# Patient Record
Sex: Female | Born: 1940 | Race: Black or African American | Hispanic: No | Marital: Married | Smoking: Never smoker
Health system: Southern US, Community
[De-identification: ages and names within clinical notes are randomized; demographics above are authoritative.]

## PROBLEM LIST (undated history)

## (undated) DIAGNOSIS — M1990 Unspecified osteoarthritis, unspecified site: Secondary | ICD-10-CM

## (undated) HISTORY — PX: ABDOMINAL HYSTERECTOMY: SHX81

## (undated) HISTORY — PX: BREAST EXCISIONAL BIOPSY: SUR124

## (undated) HISTORY — PX: TONSILLECTOMY: SUR1361

---

## 2001-05-05 ENCOUNTER — Emergency Department (HOSPITAL_COMMUNITY): Admission: EM | Admit: 2001-05-05 | Discharge: 2001-05-05 | Payer: Self-pay

## 2001-12-13 ENCOUNTER — Emergency Department (HOSPITAL_COMMUNITY): Admission: EM | Admit: 2001-12-13 | Discharge: 2001-12-13 | Payer: Self-pay | Admitting: Emergency Medicine

## 2004-07-14 ENCOUNTER — Emergency Department (HOSPITAL_COMMUNITY): Admission: EM | Admit: 2004-07-14 | Discharge: 2004-07-14 | Payer: Self-pay | Admitting: Emergency Medicine

## 2005-03-21 ENCOUNTER — Ambulatory Visit (HOSPITAL_COMMUNITY): Admission: RE | Admit: 2005-03-21 | Discharge: 2005-03-21 | Payer: Self-pay | Admitting: Gastroenterology

## 2005-04-30 ENCOUNTER — Observation Stay (HOSPITAL_COMMUNITY): Admission: RE | Admit: 2005-04-30 | Discharge: 2005-05-01 | Payer: Self-pay | Admitting: Orthopedic Surgery

## 2007-08-25 ENCOUNTER — Encounter: Admission: RE | Admit: 2007-08-25 | Discharge: 2007-08-25 | Payer: Self-pay | Admitting: Internal Medicine

## 2007-09-07 ENCOUNTER — Encounter: Admission: RE | Admit: 2007-09-07 | Discharge: 2007-09-07 | Payer: Self-pay | Admitting: Internal Medicine

## 2007-12-30 ENCOUNTER — Emergency Department (HOSPITAL_COMMUNITY): Admission: EM | Admit: 2007-12-30 | Discharge: 2007-12-31 | Payer: Self-pay | Admitting: Emergency Medicine

## 2008-09-22 ENCOUNTER — Encounter: Admission: RE | Admit: 2008-09-22 | Discharge: 2008-09-22 | Payer: Self-pay | Admitting: Internal Medicine

## 2009-03-20 ENCOUNTER — Emergency Department (HOSPITAL_COMMUNITY): Admission: EM | Admit: 2009-03-20 | Discharge: 2009-03-21 | Payer: Self-pay | Admitting: Emergency Medicine

## 2009-05-24 ENCOUNTER — Inpatient Hospital Stay (HOSPITAL_COMMUNITY): Admission: RE | Admit: 2009-05-24 | Discharge: 2009-05-28 | Payer: Self-pay | Admitting: Orthopedic Surgery

## 2009-11-05 ENCOUNTER — Encounter: Admission: RE | Admit: 2009-11-05 | Discharge: 2009-11-05 | Payer: Self-pay | Admitting: Internal Medicine

## 2011-01-13 ENCOUNTER — Encounter
Admission: RE | Admit: 2011-01-13 | Discharge: 2011-01-13 | Payer: Self-pay | Source: Home / Self Care | Attending: Internal Medicine | Admitting: Internal Medicine

## 2011-01-19 ENCOUNTER — Encounter: Payer: Self-pay | Admitting: Internal Medicine

## 2011-04-08 LAB — PROTIME-INR
INR: 1 (ref 0.00–1.49)
INR: 1.7 — ABNORMAL HIGH (ref 0.00–1.49)
Prothrombin Time: 13.5 seconds (ref 11.6–15.2)
Prothrombin Time: 20.3 seconds — ABNORMAL HIGH (ref 11.6–15.2)
Prothrombin Time: 20.6 seconds — ABNORMAL HIGH (ref 11.6–15.2)
Prothrombin Time: 24.4 seconds — ABNORMAL HIGH (ref 11.6–15.2)

## 2011-04-08 LAB — COMPREHENSIVE METABOLIC PANEL
AST: 19 U/L (ref 0–37)
BUN: 11 mg/dL (ref 6–23)
CO2: 28 mEq/L (ref 19–32)
Chloride: 110 mEq/L (ref 96–112)
Sodium: 146 mEq/L — ABNORMAL HIGH (ref 135–145)
Total Protein: 7.3 g/dL (ref 6.0–8.3)

## 2011-04-08 LAB — DIFFERENTIAL
Basophils Relative: 2 % — ABNORMAL HIGH (ref 0–1)
Eosinophils Absolute: 0 10*3/uL (ref 0.0–0.7)
Eosinophils Relative: 1 % (ref 0–5)
Lymphocytes Relative: 28 % (ref 12–46)
Lymphs Abs: 2.6 10*3/uL (ref 0.7–4.0)

## 2011-04-08 LAB — URINALYSIS, ROUTINE W REFLEX MICROSCOPIC
Bilirubin Urine: NEGATIVE
Glucose, UA: NEGATIVE mg/dL
Protein, ur: NEGATIVE mg/dL
Urobilinogen, UA: 0.2 mg/dL (ref 0.0–1.0)

## 2011-04-08 LAB — TYPE AND SCREEN
ABO/RH(D): A POS
Antibody Screen: NEGATIVE

## 2011-04-08 LAB — HEMOGLOBIN AND HEMATOCRIT, BLOOD
HCT: 31 % — ABNORMAL LOW (ref 36.0–46.0)
Hemoglobin: 10.2 g/dL — ABNORMAL LOW (ref 12.0–15.0)

## 2011-04-08 LAB — HEMATOCRIT: HCT: 31.5 % — ABNORMAL LOW (ref 36.0–46.0)

## 2011-04-08 LAB — CBC
HCT: 28.4 % — ABNORMAL LOW (ref 36.0–46.0)
HCT: 39.3 % (ref 36.0–46.0)
Hemoglobin: 12.9 g/dL (ref 12.0–15.0)
MCHC: 33.5 g/dL (ref 30.0–36.0)
Platelets: 211 10*3/uL (ref 150–400)
RBC: 3.05 MIL/uL — ABNORMAL LOW (ref 3.87–5.11)
RDW: 12.9 % (ref 11.5–15.5)

## 2011-04-08 LAB — ABO/RH: ABO/RH(D): A POS

## 2011-04-08 LAB — APTT: aPTT: 24 seconds (ref 24–37)

## 2011-04-10 LAB — POCT I-STAT, CHEM 8
BUN: 10 mg/dL (ref 6–23)
Calcium, Ion: 1.16 mmol/L (ref 1.12–1.32)
Creatinine, Ser: 0.9 mg/dL (ref 0.4–1.2)
Glucose, Bld: 96 mg/dL (ref 70–99)
TCO2: 25 mmol/L (ref 0–100)

## 2011-04-10 LAB — URINALYSIS, ROUTINE W REFLEX MICROSCOPIC
Glucose, UA: NEGATIVE mg/dL
Hgb urine dipstick: NEGATIVE
Ketones, ur: NEGATIVE mg/dL
Protein, ur: NEGATIVE mg/dL
Urobilinogen, UA: 1 mg/dL (ref 0.0–1.0)

## 2011-04-10 LAB — DIFFERENTIAL
Eosinophils Relative: 1 % (ref 0–5)
Lymphocytes Relative: 8 % — ABNORMAL LOW (ref 12–46)
Lymphs Abs: 0.8 10*3/uL (ref 0.7–4.0)
Monocytes Absolute: 0.7 10*3/uL (ref 0.1–1.0)
Monocytes Relative: 8 % (ref 3–12)

## 2011-04-10 LAB — CBC
HCT: 41.3 % (ref 36.0–46.0)
Hemoglobin: 13.4 g/dL (ref 12.0–15.0)
WBC: 10 10*3/uL (ref 4.0–10.5)

## 2011-04-10 LAB — URINE MICROSCOPIC-ADD ON

## 2011-04-17 ENCOUNTER — Emergency Department (HOSPITAL_COMMUNITY)
Admission: EM | Admit: 2011-04-17 | Discharge: 2011-04-18 | Disposition: A | Discharge status: Home / Self Care | Payer: BC Managed Care – PPO | Attending: Emergency Medicine | Admitting: Emergency Medicine

## 2011-04-17 DIAGNOSIS — M1990 Unspecified osteoarthritis, unspecified site: Secondary | ICD-10-CM | POA: Insufficient documentation

## 2011-04-17 DIAGNOSIS — Z96649 Presence of unspecified artificial hip joint: Secondary | ICD-10-CM | POA: Insufficient documentation

## 2011-04-17 DIAGNOSIS — M25559 Pain in unspecified hip: Secondary | ICD-10-CM | POA: Insufficient documentation

## 2011-04-17 DIAGNOSIS — M25569 Pain in unspecified knee: Secondary | ICD-10-CM | POA: Insufficient documentation

## 2011-05-13 NOTE — Op Note (Signed)
NAMEMADISSON, Nancy Vasquez             ACCOUNT NO.:  [id number]   MEDICAL RECORD NO.:  [id number]          PATIENT TYPE:  INP   LOCATION:  0004                         FACILITY:  [facility]   PHYSICIAN:  Georges Lynch. Gioffre, M.D.DATE OF BIRTH:  [birth year]   DATE OF PROCEDURE:  05/24/2009  DATE OF DISCHARGE:                               OPERATIVE REPORT   SURGEON:  Georges Lynch. Darrelyn Hillock, M.D.   ASSISTANT:  Madlyn Frankel. Charlann Boxer, M.D.   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   POSTOPERATIVE DIAGNOSIS:  Severe degenerative arthritis of the right  hip.   HOSPITAL COURSE:  The operation was a right total hip arthroplasty  utilizing a DePuy system.  We utilized a size 6 high offset Tri-Lock  stem.  The cup size was a 52 mm Pinnacle cup.  The femoral head was a  +1.5 diameter 36 mm.  One screw was utilized for fixation of the  pinnacle cup.  The hole eliminator also was used.  The metal insert  which was the pinnacle cup was a 52 mm outside diameter.  The head was a  +1.5 metal head.  The diameter of the head was 36 mm.  The procedure was  under general anesthesia.  The patient first had 2 g of IV Ancef.  Routine orthopedic prep and drape of the right hip was carried out.  Bleeders were identified and cauterized.  At this time, a posterolateral  approach to the hip was carried out.  Bleeders were identified and  cauterized.  I went down and incised the iliotibial band.  Then went  down and partially detached external rotators and did a capsulectomy.  The hip was dislocated.  The neck cut and guide was used.  We cut the  neck at the appropriate length and angle.  Following that, I then used  my box osteotome to clear out the cancellus bone of the greater  trochanteric region.  We then used a widening reamer and then we rasped  up to a size 6 Tri-Lock stem.  After that was done, we  thoroughly  irrigated out the canal.  We irrigated the canal prior to the rasping  and after  the rasping.  We then paid attention to the cup.  We did a  partial capsulectomy, removed the overlying spurs of the cup from the  acetabulum.  We then reamed the acetabulum up to a size 51 for a 52 mm  cup.  We then inserted our permanent 52 mm pinnacle cup with one screw.  We utilized the Charnley guide to check the cup before the screw was  inserted.  We had excellent alignment following that.  We then inserted  our temporary liner into the cup.  We inserted our temporary trial stem,  reduced the hip, had excellent function and excellent stability.  We  also checked and made sure that we had good leg length which we did.  We  checked the leg length several times.  We then removed the trials and  inserted our permanent metal insert.  Following that, we then inserted  our  size 6 Tri-Lock stem.  We then cleared the acetabulum and reduced  the hip.  Note prior to inserting our permanent cup, we inserted a hole  eliminator.  We had one screw for cup fixation.  The hip was reduced  quite nicely and we had good stability.  We did use a +1.5 metal ball 36  mm  diameter for the head.  Obviously we went through the trials before this  and that was the length we selected.  Thoroughly irrigated out the area,  closed the wound in layers in the usual fashion.  We had good  hemostasis.  The skin was closed with metal staples and a sterile  dressing was applied.           ______________________________  Georges Lynch Darrelyn Hillock, M.D.     RAG/MEDQ  D:  05/24/2009  T:  05/24/2009  Job:  [id number]   cc:   Windy Fast A. Darrelyn Hillock, M.D.  Fax: [phone]

## 2011-05-13 NOTE — H&P (Signed)
Nancy Vasquez, Nancy Vasquez             ACCOUNT NO.:  [id number]   MEDICAL RECORD NO.:  [id number]          PATIENT TYPE:  INP   LOCATION:  0004                         FACILITY:  [facility]   PHYSICIAN:  Georges Lynch. Gioffre, M.D.DATE OF BIRTH:  09-06-1941   DATE OF ADMISSION:  05/24/2009  DATE OF DISCHARGE:                              HISTORY & PHYSICAL   The patient was seen in the office for a history and physical  complaining of pain in the right hip.   PRESENT ILLNESS:  Painful motion of her right hip and painful pain when  she ambulated.   ALLERGIES:  NONE.   MEDICINES:  She was on Darvocet at home for pain.  She was also on  glucosamine.   FAMILY HISTORY:  Her mother had a stroke.  She cannot recall anything  about her father.   REVIEW OF SYSTEMS:  She had a heart murmur in the past and a history of  rheumatic fever as a child.   PHYSICAL EXAM:  MENTAL STATUS:  She was alert and oriented.  GAIT:  She had an antalgic gait.  Her temperature was 98.6.  Pulse 60.  Respirations normal.  Blood  pressure 115/80.  HEAD AND NECK:  Negative.  LUNGS:  Clear  HEART:  Normal sinus rhythm with no murmurs.  BREAST:  Exam was deferred.  ABDOMEN:  Completely normal.  BACK:  Normal.  LEFT HIP:  She had some pain with motion of the left hip.  The main  problem was painful limited motion to the right hip.  She also had about  1/2 inch shortening on the right as compared to the left.  She had good  circulation in her lower extremities.  KNEES:  Negative.  CALVES:  Soft, nontender.  No phlebitis.   X-rays of her hip showed severe degenerative arthritis of the right hip.   IMPRESSION:  Degenerative arthritis, right hip.   PLAN:  I planned to admit her to the hospital for a right total hip  arthroplasty utilizing the DePuy system.           ______________________________  Georges Lynch Nancy Vasquez, M.D.    RAG/MEDQ  D:  05/24/2009  T:  05/24/2009  Job:  [id number]

## 2011-05-16 NOTE — Discharge Summary (Signed)
NAMEJAYLEN, CLAUDE             ACCOUNT NO.:  [id number]   MEDICAL RECORD NO.:  [id number]          PATIENT TYPE:  INP   LOCATION:  1525                         FACILITY:  [facility]   PHYSICIAN:  Georges Lynch. Gioffre, M.D.DATE OF BIRTH:  06-Jan-1941   DATE OF ADMISSION:  05/24/2009  DATE OF DISCHARGE:  05/28/2009                               DISCHARGE SUMMARY   HISTORY OF PRESENT ILLNESS:  Mrs. Deeley was admitted to the hospital  and taken to surgery on May 24, 2009.  She had a diagnosis of  degenerative arthritis of the right hip.  On May 24, 2009, we did a  right total hip arthroplasty on her.  Dr. Charlann Boxer was the assist.  He  estimated blood loss about 400 mL.  Postop, she was maintained on the  Coumadin/heparin protocol.  She was seen by me the following day on May 25, 2009, at 7:20 a.m. she was doing well.  Hemoglobin was 10,  temperature 99.  She was able to dorsiflex her foot.  Her x-rays postop  looked excellent.  The patient was also followed by pharmacy with the  INR in order to manage the Coumadin levels.  He was also seen the  following day on 05/26/2009, temperature 99, pulse 86, blood pressure  100/64, hemoglobin 9.4.  She was being evaluated for home discharge.  She was seen again on May 27, 2009.  She was doing fine.  Her hemoglobin  was 9.5, INR 1.7.  Very minimal amount of serous drainage from the  wound, but the wound looked fine.  On 05/31, she was seen again and  evaluated and she had no postoperative complications, temperature 98.4,  INR 2.5.  She was then discharged to be followed in our office.  She was  discharged on May 28, 2009, and followed in the office.  The pertinent  laboratory results with her final hemoglobin 10.2, hematocrit 31.  Her  white count on admission 9200, hemoglobin 12.9, hematocrit 39.3,  platelet count 301.  Differential was normal.  Her sodium 146, potassium  5.1, chloride was 110.  Creatinine was 0.73, SGOT 19, SGPT 20, alkaline  phosphatase normal at 53.  Initial INR was one.  Initial PT was 13.5 the  initial PTT was normal at 44.  Postoperative x-ray looks excellent with  no particular complications as far as her x-ray.   DISCHARGE MEDICATIONS:  1. Robaxin 500 mg t.i.d. p.r.n. for spasms.  2. Percocet 10/650 one every 4 hours p.r.n. for pain.  3. Coumadin 5 mg a day.   DISCHARGE INSTRUCTIONS:  1. See me in the office for follow-up in 2 weeks from the day of      surgery prior to if there is a problem.  2. She has to have an INR done weekly to manage her Coumadin.  3. She can ambulate toe touch to partial weightbearing on that      affected side.  4. Dressing changes daily at home.  5. If she has any postoperative complications, she was instructed to      call us at the office.  ______________________________  Georges Lynch Darrelyn Hillock, M.D.     RAG/MEDQ  D:  06/19/2009  T:  06/19/2009  Job:  [id number]

## 2011-05-16 NOTE — Op Note (Signed)
Nancy Vasquez, Nancy Vasquez             ACCOUNT NO.:  [id number]   MEDICAL RECORD NO.:  [id number]          PATIENT TYPE:  OBV   LOCATION:  0447                         [facility]ACILITY:  [facility]   PHYSICIAN:  Georges Lynch. Gioffre, M.D.DATE OF BIRTH:  1941/02/23   DATE OF PROCEDURE:  04/30/2005  DATE OF DISCHARGE:                                 OPERATIVE REPORT   SURGEON:  Georges Lynch. Darrelyn Hillock, M.D.   ASSISTANT:  Ebbie Ridge. Paitsel, P.A.   PREOPERATIVE DIAGNOSES:  1.  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  2.  Severe impingement syndrome causing the above tear.   POSTOPERATIVE DIAGNOSES:  1.  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  2.  Severe impingement syndrome causing the above tear.   OPERATIONS:  1.  Partial acromionectomy and acromioplasty, left shoulder.  2.  Repair of a large central horseshoe-type hole in the rotator cuff tendon      of left shoulder utilizing the Restore tendon graft and two Mitek anchor      sutures.   DESCRIPTION OF PROCEDURE:  Prior to general anesthesia, the patient had an  interscalene nerve block in the holding area and then was taken back to the  operating room and a general anesthetic was given.  At this time, a sterile  prep and drape of the left shoulder was carried out.  The patient was given  1 g of IV Ancef.  An incision was made over the anterior aspect of the left  shoulder.  Bleeders were identified and cauterized.  At this time, I  separated the deltoid tendon by sharp dissection from the area.  I then  noted a severe overgrowth of the acromion.  We brought the shoulder up in  abduction and there was a large central horseshoe-type hole in the tendon  that was caused by the impingement of the acromion.  We then did an  acromionectomy and acromioplasty in the usual fashion.  We then bone waxed  the undersurface of the acromion and irrigated the area out.  Then we burred  down with the bur the lateral articular surface of  the humerus.  Two two-  prong Mitek sutures were placed in the proximal humerus.  I then utilized a  Restore graft and brought the central part of the tendon down in some  tension and sutured it in place.  The graft was sutured to the proximal  tendon.  Then the distal part of the graft was sutured by way of the Mitek  sutures to the humerus.  We had a nice repair.  We irrigated the area out  and then reattached the deltoid tendon and muscle in usual fashion.  The  subcutaneous was closed with 0 Vicryl.  The skin was closed with staples.  I  did inject about 15 mL of 0.5% Marcaine into the wound site.  Sterile  dressings were applied and the patient was placed in a shoulder immobilizer.     RAG/MEDQ  D:  04/30/2005  T:  04/30/2005  Job:  [id number]

## 2011-06-26 ENCOUNTER — Other Ambulatory Visit: Payer: Self-pay | Admitting: Orthopedic Surgery

## 2011-06-26 ENCOUNTER — Ambulatory Visit
Admission: RE | Admit: 2011-06-26 | Discharge: 2011-06-26 | Disposition: A | Discharge status: Home / Self Care | Payer: BC Managed Care – PPO | Source: Ambulatory Visit | Attending: Orthopedic Surgery | Admitting: Orthopedic Surgery

## 2011-06-26 DIAGNOSIS — M79605 Pain in left leg: Secondary | ICD-10-CM

## 2012-02-12 ENCOUNTER — Other Ambulatory Visit: Payer: Self-pay | Admitting: Family Medicine

## 2012-02-12 DIAGNOSIS — Z1231 Encounter for screening mammogram for malignant neoplasm of breast: Secondary | ICD-10-CM

## 2012-03-01 ENCOUNTER — Ambulatory Visit: Payer: BC Managed Care – PPO

## 2012-03-15 ENCOUNTER — Ambulatory Visit
Admission: RE | Admit: 2012-03-15 | Discharge: 2012-03-15 | Disposition: A | Discharge status: Home / Self Care | Payer: BC Managed Care – PPO | Source: Ambulatory Visit | Attending: Family Medicine | Admitting: Family Medicine

## 2012-03-15 DIAGNOSIS — Z1231 Encounter for screening mammogram for malignant neoplasm of breast: Secondary | ICD-10-CM

## 2013-02-08 ENCOUNTER — Ambulatory Visit
Admission: RE | Admit: 2013-02-08 | Discharge: 2013-02-08 | Disposition: A | Discharge status: Home / Self Care | Payer: Worker's Compensation | Source: Ambulatory Visit | Attending: Internal Medicine | Admitting: Internal Medicine

## 2013-02-08 ENCOUNTER — Other Ambulatory Visit: Payer: Self-pay | Admitting: Internal Medicine

## 2013-02-08 DIAGNOSIS — H538 Other visual disturbances: Secondary | ICD-10-CM

## 2013-02-08 DIAGNOSIS — S0990XA Unspecified injury of head, initial encounter: Secondary | ICD-10-CM

## 2013-02-08 DIAGNOSIS — R42 Dizziness and giddiness: Secondary | ICD-10-CM

## 2013-02-16 ENCOUNTER — Other Ambulatory Visit: Payer: Self-pay | Admitting: Family Medicine

## 2013-02-16 DIAGNOSIS — Z1231 Encounter for screening mammogram for malignant neoplasm of breast: Secondary | ICD-10-CM

## 2013-03-21 ENCOUNTER — Ambulatory Visit: Payer: Worker's Compensation

## 2013-03-24 ENCOUNTER — Ambulatory Visit
Admission: RE | Admit: 2013-03-24 | Discharge: 2013-03-24 | Disposition: A | Discharge status: Home / Self Care | Payer: BC Managed Care – PPO | Source: Ambulatory Visit | Attending: Family Medicine | Admitting: Family Medicine

## 2013-03-24 DIAGNOSIS — Z1231 Encounter for screening mammogram for malignant neoplasm of breast: Secondary | ICD-10-CM

## 2013-03-31 ENCOUNTER — Ambulatory Visit
Admission: RE | Admit: 2013-03-31 | Discharge: 2013-03-31 | Disposition: A | Discharge status: Home / Self Care | Payer: Worker's Compensation | Source: Ambulatory Visit | Attending: Neurology | Admitting: Neurology

## 2013-03-31 ENCOUNTER — Other Ambulatory Visit: Payer: Self-pay | Admitting: Neurology

## 2013-03-31 DIAGNOSIS — M542 Cervicalgia: Secondary | ICD-10-CM

## 2013-03-31 DIAGNOSIS — W19XXXA Unspecified fall, initial encounter: Secondary | ICD-10-CM

## 2013-04-13 ENCOUNTER — Ambulatory Visit: Payer: Worker's Compensation | Attending: Neurology | Admitting: Physical Therapy

## 2013-04-13 DIAGNOSIS — R42 Dizziness and giddiness: Secondary | ICD-10-CM | POA: Insufficient documentation

## 2013-04-13 DIAGNOSIS — IMO0001 Reserved for inherently not codable concepts without codable children: Secondary | ICD-10-CM | POA: Insufficient documentation

## 2013-04-21 ENCOUNTER — Ambulatory Visit: Payer: Worker's Compensation | Admitting: *Deleted

## 2013-04-29 ENCOUNTER — Ambulatory Visit: Payer: Worker's Compensation | Attending: Neurology | Admitting: Rehabilitative and Restorative Service Providers"

## 2013-04-29 DIAGNOSIS — IMO0001 Reserved for inherently not codable concepts without codable children: Secondary | ICD-10-CM | POA: Insufficient documentation

## 2013-04-29 DIAGNOSIS — R42 Dizziness and giddiness: Secondary | ICD-10-CM | POA: Insufficient documentation

## 2013-05-04 ENCOUNTER — Ambulatory Visit: Payer: Worker's Compensation | Admitting: Rehabilitative and Restorative Service Providers"

## 2013-05-11 ENCOUNTER — Ambulatory Visit: Payer: Worker's Compensation | Admitting: Rehabilitative and Restorative Service Providers"

## 2013-08-12 ENCOUNTER — Other Ambulatory Visit (HOSPITAL_COMMUNITY): Payer: Self-pay | Admitting: Cardiology

## 2013-08-12 DIAGNOSIS — R079 Chest pain, unspecified: Secondary | ICD-10-CM

## 2013-08-24 ENCOUNTER — Encounter (HOSPITAL_COMMUNITY)
Admission: RE | Admit: 2013-08-24 | Discharge: 2013-08-24 | Disposition: A | Discharge status: Home / Self Care | Payer: BC Managed Care – PPO | Source: Ambulatory Visit | Attending: Cardiology | Admitting: Cardiology

## 2013-08-24 ENCOUNTER — Other Ambulatory Visit: Payer: Self-pay

## 2013-08-24 DIAGNOSIS — R079 Chest pain, unspecified: Secondary | ICD-10-CM | POA: Insufficient documentation

## 2013-08-24 MED ORDER — TECHNETIUM TC 99M SESTAMIBI GENERIC - CARDIOLITE
10.0000 | Freq: Once | INTRAVENOUS | Status: AC | PRN
  Administered 2013-08-24: 10 via INTRAVENOUS

## 2013-08-24 MED ORDER — REGADENOSON 0.4 MG/5ML IV SOLN
INTRAVENOUS | Status: AC
  Administered 2013-08-24: 0.4 mg via INTRAVENOUS
  Filled 2013-08-24: qty 5

## 2013-08-24 MED ORDER — REGADENOSON 0.4 MG/5ML IV SOLN
0.4000 mg | Freq: Once | INTRAVENOUS | Status: AC
  Administered 2013-08-24: 0.4 mg via INTRAVENOUS

## 2013-08-24 MED ORDER — TECHNETIUM TC 99M SESTAMIBI GENERIC - CARDIOLITE
30.0000 | Freq: Once | INTRAVENOUS | Status: AC | PRN
  Administered 2013-08-24: 30 via INTRAVENOUS

## 2014-01-27 ENCOUNTER — Other Ambulatory Visit: Payer: Self-pay | Admitting: Physician Assistant

## 2014-01-27 DIAGNOSIS — Z780 Asymptomatic menopausal state: Secondary | ICD-10-CM

## 2014-02-20 ENCOUNTER — Ambulatory Visit
Admission: RE | Admit: 2014-02-20 | Discharge: 2014-02-20 | Disposition: A | Discharge status: Home / Self Care | Payer: Self-pay | Source: Ambulatory Visit | Attending: Physician Assistant | Admitting: Physician Assistant

## 2014-02-20 DIAGNOSIS — Z780 Asymptomatic menopausal state: Secondary | ICD-10-CM

## 2014-11-19 ENCOUNTER — Emergency Department (HOSPITAL_COMMUNITY): Payer: BC Managed Care – PPO

## 2014-11-19 ENCOUNTER — Emergency Department (HOSPITAL_COMMUNITY)
Admission: EM | Admit: 2014-11-19 | Discharge: 2014-11-19 | Disposition: A | Discharge status: Home / Self Care | Payer: BC Managed Care – PPO | Attending: Emergency Medicine | Admitting: Emergency Medicine

## 2014-11-19 ENCOUNTER — Encounter (HOSPITAL_COMMUNITY): Payer: Self-pay | Admitting: Family Medicine

## 2014-11-19 DIAGNOSIS — Z791 Long term (current) use of non-steroidal anti-inflammatories (NSAID): Secondary | ICD-10-CM | POA: Insufficient documentation

## 2014-11-19 DIAGNOSIS — R1031 Right lower quadrant pain: Secondary | ICD-10-CM | POA: Diagnosis present

## 2014-11-19 DIAGNOSIS — M1990 Unspecified osteoarthritis, unspecified site: Secondary | ICD-10-CM | POA: Diagnosis not present

## 2014-11-19 DIAGNOSIS — Z7982 Long term (current) use of aspirin: Secondary | ICD-10-CM | POA: Insufficient documentation

## 2014-11-19 DIAGNOSIS — Z79899 Other long term (current) drug therapy: Secondary | ICD-10-CM | POA: Insufficient documentation

## 2014-11-19 HISTORY — DX: Unspecified osteoarthritis, unspecified site: M19.90

## 2014-11-19 LAB — CBC WITH DIFFERENTIAL/PLATELET
BASOS ABS: 0 10*3/uL (ref 0.0–0.1)
BASOS PCT: 0 % (ref 0–1)
Eosinophils Absolute: 0.2 10*3/uL (ref 0.0–0.7)
Eosinophils Relative: 3 % (ref 0–5)
HEMATOCRIT: 39.5 % (ref 36.0–46.0)
HEMOGLOBIN: 12.5 g/dL (ref 12.0–15.0)
LYMPHS PCT: 41 % (ref 12–46)
Lymphs Abs: 2.6 10*3/uL (ref 0.7–4.0)
MCH: 29.4 pg (ref 26.0–34.0)
MCHC: 31.6 g/dL (ref 30.0–36.0)
MCV: 92.9 fL (ref 78.0–100.0)
MONO ABS: 0.7 10*3/uL (ref 0.1–1.0)
MONOS PCT: 11 % (ref 3–12)
NEUTROS ABS: 2.8 10*3/uL (ref 1.7–7.7)
NEUTROS PCT: 45 % (ref 43–77)
Platelets: 279 10*3/uL (ref 150–400)
RBC: 4.25 MIL/uL (ref 3.87–5.11)
RDW: 12.5 % (ref 11.5–15.5)
WBC: 6.2 10*3/uL (ref 4.0–10.5)

## 2014-11-19 LAB — COMPREHENSIVE METABOLIC PANEL
ALBUMIN: 3.8 g/dL (ref 3.5–5.2)
ALK PHOS: 67 U/L (ref 39–117)
ALT: 16 U/L (ref 0–35)
AST: 15 U/L (ref 0–37)
Anion gap: 13 (ref 5–15)
BILIRUBIN TOTAL: 0.2 mg/dL — AB (ref 0.3–1.2)
BUN: 14 mg/dL (ref 6–23)
CHLORIDE: 107 meq/L (ref 96–112)
CO2: 23 meq/L (ref 19–32)
CREATININE: 0.76 mg/dL (ref 0.50–1.10)
Calcium: 9.3 mg/dL (ref 8.4–10.5)
GFR calc Af Amer: >90 mL/min (ref >90)
GFR, EST NON AFRICAN AMERICAN: 83 mL/min — AB (ref >90)
Glucose, Bld: 84 mg/dL (ref 70–99)
POTASSIUM: 4 meq/L (ref 3.7–5.3)
Sodium: 143 mEq/L (ref 137–147)
Total Protein: 7.4 g/dL (ref 6.0–8.3)

## 2014-11-19 LAB — URINALYSIS, ROUTINE W REFLEX MICROSCOPIC
BILIRUBIN URINE: NEGATIVE
GLUCOSE, UA: NEGATIVE mg/dL
HGB URINE DIPSTICK: NEGATIVE
KETONES UR: NEGATIVE mg/dL
Leukocytes, UA: NEGATIVE
Nitrite: NEGATIVE
PH: 6 (ref 5.0–8.0)
PROTEIN: NEGATIVE mg/dL
Specific Gravity, Urine: 1.022 (ref 1.005–1.030)
Urobilinogen, UA: 0.2 mg/dL (ref 0.0–1.0)

## 2014-11-19 LAB — I-STAT CREATININE, ED: CREATININE: 0.8 mg/dL (ref 0.50–1.10)

## 2014-11-19 MED ORDER — IOHEXOL 300 MG/ML  SOLN
80.0000 mL | Freq: Once | INTRAMUSCULAR | Status: AC | PRN
  Administered 2014-11-19: 80 mL via INTRAVENOUS

## 2014-11-19 MED ORDER — IOHEXOL 300 MG/ML  SOLN
25.0000 mL | Freq: Once | INTRAMUSCULAR | Status: AC | PRN
  Administered 2014-11-19: 25 mL via ORAL

## 2014-11-19 NOTE — ED Provider Notes (Signed)
CSN: [id number]     Arrival date & time 11/19/14  1629 History   First MD Initiated Contact with Patient 11/19/14 1653     Chief Complaint  Patient presents with  . Abdominal Pain     (Consider location/radiation/quality/duration/timing/severity/associated sxs/prior Treatment) HPI 73 year old female presents with 3 weeks gradual onset constant dull ache right lower quadrant pain radiating slightly to the left lower quadrant worse with position changes and palpation no change with eating or drinking no dysuria no diarrhea no bloody stools no nausea vomiting no severe pain no pain down her legs no weakness or numbness no change in bowel or bladder control no chest pain cough or shortness of breath no treatment prior to arrival. She does not know if she has had her appendix removed when she had her hysterectomy. She has had prior cholecystectomy. Past Medical History  Diagnosis Date  . Arthritis     has had right hip replacement   Past Surgical History  Procedure Laterality Date  . Abdominal hysterectomy     History reviewed. No pertinent family history. History  Substance Use Topics  . Smoking status: Never Smoker   . Smokeless tobacco: Not on file  . Alcohol Use: Yes   OB History    No data available     Review of Systems 10 Systems reviewed and are negative for acute change except as noted in the HPI.   Allergies  Review of patient's allergies indicates no known allergies.  Home Medications   Prior to Admission medications   Medication Sig Start Date End Date Taking? Authorizing Provider  aspirin 81 MG tablet Take 81 mg by mouth daily.   Yes Historical Provider, MD  naproxen sodium (ANAPROX) 220 MG tablet Take 220 mg by mouth 2 (two) times daily with a meal.   Yes Historical Provider, MD   BP 106/51 mmHg  Pulse 61  Temp(Src) 97.4 F (36.3 C) (Oral)  Resp 16  Ht 5\' 5"  (1.651 m)  Wt 189 lb (85.73 kg)  BMI 31.45 kg/m2  SpO2 97% Physical Exam  Constitutional:   Awake, alert, nontoxic appearance.  HENT:  Head: Atraumatic.  Eyes: Right eye exhibits no discharge. Left eye exhibits no discharge.  Neck: Neck supple.  Cardiovascular: Normal rate and regular rhythm.   No murmur heard. Pulmonary/Chest: Effort normal and breath sounds normal. No respiratory distress. She has no wheezes. She has no rales. She exhibits no tenderness.  Abdominal: Soft. Bowel sounds are normal. She exhibits no distension and no mass. There is tenderness. There is no rebound and no guarding.  Mild right lower quadrant tenderness only the rest of the abdomen is nontender  Musculoskeletal: She exhibits no tenderness.  Baseline ROM, no obvious new focal weakness.  Neurological: She is alert.  Mental status and motor strength appears baseline for patient and situation.  Skin: No rash noted.  Psychiatric: She has a normal mood and affect.  Nursing note and vitals reviewed.   ED Course  Procedures (including critical care time) Labs Review Labs Reviewed  COMPREHENSIVE METABOLIC PANEL - Abnormal; Notable for the following:    Total Bilirubin 0.2 (*)    GFR calc non Af Amer 83 (*)    All other components within normal limits  CBC WITH DIFFERENTIAL  URINALYSIS, ROUTINE W REFLEX MICROSCOPIC  I-STAT CREATININE, ED    Imaging Review No results found. Ct Abdomen Pelvis W Contrast  11/19/2014   CLINICAL DATA:  Right lower quadrant pain  EXAM: CT ABDOMEN AND PELVIS  WITH CONTRAST  TECHNIQUE: Multidetector CT imaging of the abdomen and pelvis was performed using the standard protocol following bolus administration of intravenous contrast.  CONTRAST:  80mL OMNIPAQUE IOHEXOL 300 MG/ML  SOLN  COMPARISON:  None.  FINDINGS: Mild right paravertebral atelectasis. Normal heart size. Small hiatal hernia.  Decreased hepatic attenuation is nonspecific post contrast however suggests steatosis. There are at least 2 hypodensities within the liver, incompletely characterized, favored to reflect  biliary cyst.  Gallbladder not visualized. Mild central intrahepatic and extrahepatic biliary ductal prominence with smooth tapering to the level of the ampulla. The CBD measures 7 mm.  No appreciable abnormality of the spleen. Mild ectasia of the pancreatic duct within the neck/head up to 4 mm to the level of the ampulla. No obstructing lesion identified.  No adrenal nodule.  Too small to further characterize lower pole right renal hypodensity anteriorly. Otherwise, symmetric renal enhancement. No hydroureteronephrosis.  Colonic diverticulosis. No CT evidence for colitis or diverticulitis. The appendix is not identified and presumably decompressed or absent as there is no right lower quadrant inflammation. Small bowel loops are of normal course and caliber. No free intraperitoneal air or fluid. No lymphadenopathy.  Mild scattered atherosclerotic disease of the aorta and branch vessels without aneurysmal dilatation.  Thin walled bladder. The uterus is not seen and presumably absent. No adnexal mass.  Multilevel degenerative changes. Multilevel facet arthropathy. Mild anterolisthesis of L3 on L4. Posterior disc osteophyte complex at L4-5 results in moderate to severe central canal narrowing.  IMPRESSION: Appendix not identified.  No right lower quadrant inflammation.  Colonic diverticulosis without CT evidence for diverticulitis.  Mild central intrahepatic and extrahepatic biliary ductal dilatation with smooth tapering to the level of the ampulla. Recommend LFT correlation and ERCP if warranted.   Electronically Signed   By: Jearld LeschAndrew  DelGaizo M.D.   On: 11/19/2014 19:14    EKG Interpretation None      MDM   Final diagnoses:  RLQ abdominal pain    Patient informed of clinical course, understand medical decision-making process, and agree with plan. I doubt any other EMC precluding discharge at this time including, but not necessarily limited to the following:SBI.   Hurman HornJohn M Arnelle Nale, MD 11/23/14 1239

## 2014-11-19 NOTE — Discharge Instructions (Signed)

## 2014-11-19 NOTE — ED Notes (Signed)
Pt having RLQ pain radiating to the left side. sts some nausea. Denies any urinary symptoms.

## 2014-11-19 NOTE — ED Notes (Signed)
Pt ambulating independently w/ steady gait on d/c in no acute distress, A&Ox4.D/c instructions reviewed w/ pt and family - pt and family deny any further questions or concerns at present.  

## 2014-11-19 NOTE — ED Notes (Addendum)
Pt finished oral contrast, Dana in CT notified.

## 2015-07-03 ENCOUNTER — Other Ambulatory Visit: Payer: Self-pay

## 2015-07-03 DIAGNOSIS — Z1231 Encounter for screening mammogram for malignant neoplasm of breast: Secondary | ICD-10-CM

## 2015-07-23 ENCOUNTER — Ambulatory Visit
Admission: RE | Admit: 2015-07-23 | Discharge: 2015-07-23 | Disposition: A | Discharge status: Home / Self Care | Payer: BLUE CROSS/BLUE SHIELD | Source: Ambulatory Visit

## 2015-07-23 DIAGNOSIS — Z1231 Encounter for screening mammogram for malignant neoplasm of breast: Secondary | ICD-10-CM

## 2016-02-01 ENCOUNTER — Other Ambulatory Visit: Payer: Self-pay | Admitting: Cardiology

## 2016-02-01 DIAGNOSIS — R079 Chest pain, unspecified: Secondary | ICD-10-CM

## 2016-02-13 ENCOUNTER — Encounter (HOSPITAL_COMMUNITY): Payer: BLUE CROSS/BLUE SHIELD

## 2016-02-18 ENCOUNTER — Other Ambulatory Visit (HOSPITAL_COMMUNITY): Payer: BLUE CROSS/BLUE SHIELD

## 2016-02-18 ENCOUNTER — Encounter (HOSPITAL_COMMUNITY): Payer: BLUE CROSS/BLUE SHIELD

## 2016-07-24 ENCOUNTER — Other Ambulatory Visit: Payer: Self-pay

## 2016-07-24 ENCOUNTER — Other Ambulatory Visit: Payer: Self-pay | Admitting: Family Medicine

## 2016-07-24 DIAGNOSIS — Z1231 Encounter for screening mammogram for malignant neoplasm of breast: Secondary | ICD-10-CM

## 2016-07-30 ENCOUNTER — Ambulatory Visit
Admission: RE | Admit: 2016-07-30 | Discharge: 2016-07-30 | Disposition: A | Discharge status: Home / Self Care | Payer: BLUE CROSS/BLUE SHIELD | Source: Ambulatory Visit | Attending: Family Medicine | Admitting: Family Medicine

## 2016-07-30 DIAGNOSIS — Z1231 Encounter for screening mammogram for malignant neoplasm of breast: Secondary | ICD-10-CM

## 2016-11-26 ENCOUNTER — Encounter (HOSPITAL_COMMUNITY): Payer: BLUE CROSS/BLUE SHIELD

## 2016-12-12 ENCOUNTER — Other Ambulatory Visit: Payer: Self-pay | Admitting: Family Medicine

## 2016-12-12 DIAGNOSIS — M8580 Other specified disorders of bone density and structure, unspecified site: Secondary | ICD-10-CM

## 2016-12-17 ENCOUNTER — Ambulatory Visit
Admission: RE | Admit: 2016-12-17 | Discharge: 2016-12-17 | Disposition: A | Discharge status: Home / Self Care | Payer: BLUE CROSS/BLUE SHIELD | Source: Ambulatory Visit | Attending: Family Medicine | Admitting: Family Medicine

## 2016-12-17 DIAGNOSIS — M8580 Other specified disorders of bone density and structure, unspecified site: Secondary | ICD-10-CM

## 2017-02-06 ENCOUNTER — Encounter (HOSPITAL_COMMUNITY): Payer: BLUE CROSS/BLUE SHIELD

## 2017-02-09 ENCOUNTER — Encounter (HOSPITAL_COMMUNITY): Payer: Medicare Other

## 2017-02-25 ENCOUNTER — Encounter (HOSPITAL_COMMUNITY): Payer: Medicare Other

## 2017-03-09 ENCOUNTER — Encounter (HOSPITAL_COMMUNITY)
Admission: RE | Admit: 2017-03-09 | Discharge: 2017-03-09 | Disposition: A | Discharge status: Home / Self Care | Payer: Medicare Other | Source: Ambulatory Visit | Attending: Cardiology | Admitting: Cardiology

## 2017-03-09 DIAGNOSIS — R079 Chest pain, unspecified: Secondary | ICD-10-CM | POA: Insufficient documentation

## 2017-03-09 MED ORDER — REGADENOSON 0.4 MG/5ML IV SOLN
INTRAVENOUS | Status: AC
  Filled 2017-03-09: qty 5

## 2017-03-09 MED ORDER — REGADENOSON 0.4 MG/5ML IV SOLN
0.4000 mg | Freq: Once | INTRAVENOUS | Status: AC
  Administered 2017-03-09: 0.4 mg via INTRAVENOUS

## 2017-03-09 MED ORDER — TECHNETIUM TC 99M TETROFOSMIN IV KIT
10.0000 | PACK | Freq: Once | INTRAVENOUS | Status: AC | PRN
  Administered 2017-03-09: 10 via INTRAVENOUS

## 2017-03-09 MED ORDER — TECHNETIUM TC 99M TETROFOSMIN IV KIT
30.0000 | PACK | Freq: Once | INTRAVENOUS | Status: AC | PRN
  Administered 2017-03-09: 30 via INTRAVENOUS

## 2017-03-17 ENCOUNTER — Encounter (HOSPITAL_COMMUNITY): Payer: Self-pay | Admitting: Cardiology

## 2017-03-17 ENCOUNTER — Ambulatory Visit (HOSPITAL_COMMUNITY)
Admission: RE | Disposition: A | Discharge status: Home / Self Care | Payer: Self-pay | Source: Ambulatory Visit | Attending: Cardiology

## 2017-03-17 ENCOUNTER — Ambulatory Visit (HOSPITAL_COMMUNITY)
Admission: RE | Admit: 2017-03-17 | Discharge: 2017-03-17 | Disposition: A | Discharge status: Home / Self Care | Payer: Medicare Other | Source: Ambulatory Visit | Attending: Cardiology | Admitting: Cardiology

## 2017-03-17 DIAGNOSIS — I2510 Atherosclerotic heart disease of native coronary artery without angina pectoris: Secondary | ICD-10-CM | POA: Insufficient documentation

## 2017-03-17 DIAGNOSIS — I2584 Coronary atherosclerosis due to calcified coronary lesion: Secondary | ICD-10-CM | POA: Diagnosis not present

## 2017-03-17 HISTORY — PX: CORONARY PRESSURE/FFR STUDY: CATH118243

## 2017-03-17 HISTORY — PX: LEFT HEART CATH AND CORONARY ANGIOGRAPHY: CATH118249

## 2017-03-17 LAB — POCT ACTIVATED CLOTTING TIME: Activated Clotting Time: 400 seconds

## 2017-03-17 SURGERY — LEFT HEART CATH AND CORONARY ANGIOGRAPHY
Anesthesia: LOCAL

## 2017-03-17 MED ORDER — ONDANSETRON HCL 4 MG/2ML IJ SOLN: 4.0000 mg | Freq: Four times a day (QID) | INTRAMUSCULAR | Status: DC | PRN

## 2017-03-17 MED ORDER — SODIUM CHLORIDE 0.9 % IV SOLN
INTRAVENOUS | Status: DC | PRN
  Administered 2017-03-17: 74 mL/h via INTRAVENOUS

## 2017-03-17 MED ORDER — MIDAZOLAM HCL 2 MG/2ML IJ SOLN
INTRAMUSCULAR | Status: AC
  Filled 2017-03-17: qty 2

## 2017-03-17 MED ORDER — BIVALIRUDIN BOLUS VIA INFUSION - CUPID
INTRAVENOUS | Status: DC | PRN
  Administered 2017-03-17: 55.425 mg via INTRAVENOUS

## 2017-03-17 MED ORDER — ADENOSINE 12 MG/4ML IV SOLN
INTRAVENOUS | Status: AC
  Filled 2017-03-17: qty 16

## 2017-03-17 MED ORDER — IOPAMIDOL (ISOVUE-370) INJECTION 76%
INTRAVENOUS | Status: AC
Start: 2017-03-17 — End: 2017-03-17
  Filled 2017-03-17: qty 100

## 2017-03-17 MED ORDER — SODIUM CHLORIDE 0.9% FLUSH: 3.0000 mL | Freq: Two times a day (BID) | INTRAVENOUS | Status: DC

## 2017-03-17 MED ORDER — IOPAMIDOL (ISOVUE-370) INJECTION 76%
INTRAVENOUS | Status: AC
  Filled 2017-03-17: qty 50

## 2017-03-17 MED ORDER — FENTANYL CITRATE (PF) 100 MCG/2ML IJ SOLN
INTRAMUSCULAR | Status: DC | PRN
  Administered 2017-03-17 (×2): 25 ug via INTRAVENOUS

## 2017-03-17 MED ORDER — SODIUM CHLORIDE 0.9% FLUSH: 3.0000 mL | INTRAVENOUS | Status: DC | PRN

## 2017-03-17 MED ORDER — NITROGLYCERIN 1 MG/10 ML FOR IR/CATH LAB
INTRA_ARTERIAL | Status: DC | PRN
  Administered 2017-03-17: 150 ug via INTRACORONARY

## 2017-03-17 MED ORDER — SODIUM CHLORIDE 0.9 % IV SOLN
INTRAVENOUS | Status: DC | PRN
  Administered 2017-03-17: 1.75 mg/kg/h via INTRAVENOUS

## 2017-03-17 MED ORDER — SODIUM CHLORIDE 0.9 % WEIGHT BASED INFUSION
3.0000 mL/kg/h | INTRAVENOUS | Status: AC
  Administered 2017-03-17: 3 mL/kg/h via INTRAVENOUS

## 2017-03-17 MED ORDER — IOPAMIDOL (ISOVUE-370) INJECTION 76%
INTRAVENOUS | Status: DC | PRN
  Administered 2017-03-17: 125 mL via INTRA_ARTERIAL

## 2017-03-17 MED ORDER — ASPIRIN 81 MG PO CHEW
81.0000 mg | CHEWABLE_TABLET | ORAL | Status: AC
  Administered 2017-03-17: 81 mg via ORAL

## 2017-03-17 MED ORDER — SODIUM CHLORIDE 0.9 % IV SOLN: INTRAVENOUS | Status: AC

## 2017-03-17 MED ORDER — SODIUM CHLORIDE 0.9 % WEIGHT BASED INFUSION
1.0000 mL/kg/h | INTRAVENOUS | Status: DC
  Administered 2017-03-17 (×3): 13.518 mL/kg/h via INTRAVENOUS

## 2017-03-17 MED ORDER — OXYCODONE-ACETAMINOPHEN 5-325 MG PO TABS: 1.0000 | ORAL_TABLET | ORAL | Status: DC | PRN

## 2017-03-17 MED ORDER — ACETAMINOPHEN 325 MG PO TABS
650.0000 mg | ORAL_TABLET | ORAL | Status: DC | PRN
  Administered 2017-03-17: 650 mg via ORAL

## 2017-03-17 MED ORDER — LIDOCAINE HCL (PF) 1 % IJ SOLN
INTRAMUSCULAR | Status: AC
Start: 2017-03-17 — End: 2017-03-17
  Filled 2017-03-17: qty 30

## 2017-03-17 MED ORDER — HEPARIN (PORCINE) IN NACL 2-0.9 UNIT/ML-% IJ SOLN
INTRAMUSCULAR | Status: AC
  Filled 2017-03-17: qty 1000

## 2017-03-17 MED ORDER — ASPIRIN 81 MG PO CHEW
CHEWABLE_TABLET | ORAL | Status: AC
  Filled 2017-03-17: qty 1

## 2017-03-17 MED ORDER — SODIUM CHLORIDE 0.9 % IV SOLN: 250.0000 mL | INTRAVENOUS | Status: DC | PRN

## 2017-03-17 MED ORDER — ADENOSINE (DIAGNOSTIC) 140MCG/KG/MIN
INTRAVENOUS | Status: DC | PRN
  Administered 2017-03-17: 140 ug/kg/min via INTRAVENOUS

## 2017-03-17 MED ORDER — NITROGLYCERIN 1 MG/10 ML FOR IR/CATH LAB
INTRA_ARTERIAL | Status: AC
  Filled 2017-03-17: qty 10

## 2017-03-17 MED ORDER — BIVALIRUDIN 250 MG IV SOLR
INTRAVENOUS | Status: AC
Start: 2017-03-17 — End: 2017-03-17
  Filled 2017-03-17: qty 250

## 2017-03-17 MED ORDER — FENTANYL CITRATE (PF) 100 MCG/2ML IJ SOLN
INTRAMUSCULAR | Status: AC
  Filled 2017-03-17: qty 2

## 2017-03-17 MED ORDER — SODIUM CHLORIDE 0.9 % IV SOLN
250.0000 mL | INTRAVENOUS | Status: DC | PRN
Start: 2017-03-17 — End: 2017-03-17

## 2017-03-17 MED ORDER — ACETAMINOPHEN 325 MG PO TABS
ORAL_TABLET | ORAL | Status: AC
  Administered 2017-03-17: 650 mg via ORAL
  Filled 2017-03-17: qty 2

## 2017-03-17 MED ORDER — LIDOCAINE HCL (PF) 1 % IJ SOLN
INTRAMUSCULAR | Status: DC | PRN
  Administered 2017-03-17: 15 mL

## 2017-03-17 MED ORDER — MIDAZOLAM HCL 2 MG/2ML IJ SOLN
INTRAMUSCULAR | Status: DC | PRN
  Administered 2017-03-17 (×2): 1 mg via INTRAVENOUS

## 2017-03-17 SURGICAL SUPPLY — 18 items
CATH INFINITI 5FR MULTPACK ANG (CATHETERS) ×1 IMPLANT
CATH LAUNCHER 5F JL4 (CATHETERS) IMPLANT
CATH MICROCATH NAVVUS (MICROCATHETER) IMPLANT
CATH VISTA GUIDE 6FR XB3.5 (CATHETERS) ×1 IMPLANT
CATHETER LAUNCHER 5F JL4 (CATHETERS) ×2
DEVICE CLOSURE PERCLS PRGLD 6F (VASCULAR PRODUCTS) IMPLANT
KIT ESSENTIALS PG (KITS) ×1 IMPLANT
KIT HEART LEFT (KITS) ×2 IMPLANT
MICROCATHETER NAVVUS (MICROCATHETER) ×2
PACK CARDIAC CATHETERIZATION (CUSTOM PROCEDURE TRAY) ×2 IMPLANT
PERCLOSE PROGLIDE 6F (VASCULAR PRODUCTS) ×2
SHEATH PINNACLE 5F 10CM (SHEATH) ×1 IMPLANT
SHEATH PINNACLE 6F 10CM (SHEATH) ×1 IMPLANT
SYR MEDRAD MARK V 150ML (SYRINGE) ×2 IMPLANT
TRANSDUCER W/STOPCOCK (MISCELLANEOUS) ×2 IMPLANT
TUBING CIL FLEX 10 FLL-RA (TUBING) ×1 IMPLANT
WIRE EMERALD 3MM-J .035X150CM (WIRE) ×1 IMPLANT
WIRE RUNTHROUGH .014X180CM (WIRE) ×1 IMPLANT

## 2017-03-17 NOTE — Discharge Instructions (Signed)
Femoral Site Care °Refer to this sheet in the next few weeks. These instructions provide you with information about caring for yourself after your procedure. Your health care provider may also give you more specific instructions. Your treatment has been planned according to current medical practices, but problems sometimes occur. Call your health care provider if you have any problems or questions after your procedure. °What can I expect after the procedure? °After your procedure, it is typical to have the following: °· Bruising at the site that usually fades within 1-2 weeks. °· Blood collecting in the tissue (hematoma) that may be painful to the touch. It should usually decrease in size and tenderness within 1-2 weeks. °Follow these instructions at home: °· Take medicines only as directed by your health care provider. °· You may shower 24-48 hours after the procedure or as directed by your health care provider. Remove the bandage (dressing) and gently wash the site with plain soap and water. Pat the area dry with a clean towel. Do not rub the site, because this may cause bleeding. °· Do not take baths, swim, or use a hot tub until your health care provider approves. °· Check your insertion site every day for redness, swelling, or drainage. °· Do not apply powder or lotion to the site. °· Limit use of stairs to twice a day for the first 2-3 days or as directed by your health care provider. °· Do not squat for the first 2-3 days or as directed by your health care provider. °· Do not lift over 10 lb (4.5 kg) for 5 days after your procedure or as directed by your health care provider. °· Ask your health care provider when it is okay to: °¨ Return to work or school. °¨ Resume usual physical activities or sports. °¨ Resume sexual activity. °· Do not drive home if you are discharged the same day as the procedure. Have someone else drive you. °· You may drive 24 hours after the procedure unless otherwise instructed by  your health care provider. °· Do not operate machinery or power tools for 24 hours after the procedure or as directed by your health care provider. °· If your procedure was done as an outpatient procedure, which means that you went home the same day as your procedure, a responsible adult should be with you for the first 24 hours after you arrive home. °· Keep all follow-up visits as directed by your health care provider. This is important. °Contact a health care provider if: °· You have a fever. °· You have chills. °· You have increased bleeding from the site. Hold pressure on the site. °Get help right away if: °· You have unusual pain at the site. °· You have redness, warmth, or swelling at the site. °· You have drainage (other than a small amount of blood on the dressing) from the site. °· The site is bleeding, and the bleeding does not stop after 30 minutes of holding steady pressure on the site. °· Your leg or foot becomes pale, cool, tingly, or numb. °This information is not intended to replace advice given to you by your health care provider. Make sure you discuss any questions you have with your health care provider. °Document Released: 08/18/2014 Document Revised: 05/22/2016 Document Reviewed: 07/04/2014 °Elsevier Interactive Patient Education © 2017 [employer] ° °

## 2017-03-17 NOTE — Interval H&P Note (Signed)
Cath Lab Visit (complete for each Cath Lab visit)  Clinical Evaluation Leading to the Procedure:   ACS: No.  Non-ACS:    Anginal Classification: CCS III  Anti-ischemic medical therapy: Minimal Therapy (1 class of medications)  Non-Invasive Test Results: Intermediate-risk stress test findings: cardiac mortality 1-3%/year  Prior CABG: No previous CABG      History and Physical Interval Note:  03/17/2017 9:04 AM  Nancy Vasquez  has presented today for surgery, with the diagnosis of Chest Pain, Abnormal Stress Test  The various methods of treatment have been discussed with the patient and family. After consideration of risks, benefits and other options for treatment, the patient has consented to  Procedure(s): Left Heart Cath and Coronary Angiography (N/A) as a surgical intervention .  The patient's history has been reviewed, patient examined, no change in status, stable for surgery.  I have reviewed the patient's chart and labs.  Questions were answered to the patient's satisfaction.     Rinaldo CloudHarwani, Telma Pyeatt

## 2017-03-17 NOTE — H&P (Signed)
Printed H&P in the chart needs to be scanned 

## 2017-03-18 MED FILL — Heparin Sodium (Porcine) 2 Unit/ML in Sodium Chloride 0.9%: INTRAMUSCULAR | Qty: 1000 | Status: AC

## 2017-09-04 ENCOUNTER — Other Ambulatory Visit: Payer: Self-pay | Admitting: Family Medicine

## 2017-09-04 DIAGNOSIS — Z1231 Encounter for screening mammogram for malignant neoplasm of breast: Secondary | ICD-10-CM

## 2017-09-11 ENCOUNTER — Ambulatory Visit: Payer: Medicare Other

## 2017-09-16 ENCOUNTER — Ambulatory Visit: Payer: Medicare Other

## 2017-09-18 ENCOUNTER — Ambulatory Visit
Admission: RE | Admit: 2017-09-18 | Discharge: 2017-09-18 | Disposition: A | Discharge status: Home / Self Care | Payer: Medicare Other | Source: Ambulatory Visit | Attending: Family Medicine | Admitting: Family Medicine

## 2017-09-18 DIAGNOSIS — Z1231 Encounter for screening mammogram for malignant neoplasm of breast: Secondary | ICD-10-CM

## 2017-12-20 ENCOUNTER — Emergency Department: Admit: 2017-12-21 | Payer: MEDICARE

## 2017-12-20 DIAGNOSIS — B349 Viral infection, unspecified: Secondary | ICD-10-CM

## 2017-12-20 NOTE — ED Provider Notes (Signed)
Formatting of this note is different from the original.  HPI     76 year old female with cholesterol, HTN, up visiting from [place], presents with fever, body aches, weakness since early this morning. Reports mild sore throat. No headache/neck pain, no chest pain, cough or congestion. Denies abdominal pain, nausea/vomiting. Denies urinary symptoms. Denies back/flank pain. No sick contacts. Did get her flu shot. Denies rash. States she had seen her doctor about a pimple on her left leg but it isn't present today. Took tylenol earlier this afternoon. Daughter reports fever 101 at home.     Past Medical History:   Diagnosis Date   ? Ill-defined condition     elevated cholesterol     History reviewed. No pertinent surgical history.    History reviewed. No pertinent family history.    Social History     Socioeconomic History   ? Marital status: Not on file     Spouse name: Not on file   ? Number of children: Not on file   ? Years of education: Not on file   ? Highest education level: Not on file   Social Needs   ? Financial resource strain: Not on file   ? Food insecurity - worry: Not on file   ? Food insecurity - inability: Not on file   ? Transportation needs - medical: Not on file   ? Transportation needs - non-medical: Not on file   Occupational History   ? Not on file   Tobacco Use   ? Smoking status: Never Smoker   ? Smokeless tobacco: Never Used   Substance and Sexual Activity   ? Alcohol use: No     Frequency: Never   ? Drug use: Not on file   ? Sexual activity: Not on file   Other Topics Concern   ? Not on file   Social History Narrative   ? Not on file     ALLERGIES: Patient has no known allergies.    Review of Systems   Constitutional: Positive for chills, fatigue and fever. Negative for appetite change.   HENT: Positive for sore throat. Negative for congestion.    Eyes: Negative for visual disturbance.   Respiratory: Negative for cough and shortness of breath.    Cardiovascular: Negative for chest  pain.   Gastrointestinal: Negative for abdominal pain, nausea and vomiting.   Endocrine: Negative for polyuria.   Genitourinary: Negative for dysuria.   Musculoskeletal: Negative for gait problem.   Skin: Negative for rash.   Neurological: Negative for headaches.   Psychiatric/Behavioral: Negative for dysphoric mood.     Vitals:    12/20/17 2256   BP: 148/74   Pulse: 92   Resp: 17   Temp: 99.9 F (37.7 C)   SpO2: 95%   Weight: 70.3 kg (155 lb)   Height: 5\' 8"  (1.727 m)       Physical Exam   Constitutional: She is oriented to person, place, and time. She appears well-developed and well-nourished. No distress.   Appears weak   HENT:   Head: Normocephalic and atraumatic.   Mouth/Throat: No oropharyngeal exudate.   Eyes: Pupils are equal, round, and reactive to light. Right eye exhibits no discharge. Left eye exhibits no discharge. No scleral icterus.   Neck: Normal range of motion. Neck supple. No JVD present.   Cardiovascular: Normal rate, regular rhythm and normal heart sounds.   No murmur heard.  Pulmonary/Chest: Effort normal and breath sounds normal. No stridor.  No respiratory distress. She has no wheezes. She has no rales. She exhibits no tenderness.   Abdominal: Soft. Bowel sounds are normal. She exhibits no distension and no mass. There is no tenderness. There is no rebound and no guarding.   Musculoskeletal: Normal range of motion.   Neurological: She is oriented to person, place, and time.   Skin: Skin is warm and dry. Capillary refill takes less than 2 seconds. No rash noted.   Psychiatric: She has a normal mood and affect. Her behavior is normal. Judgment and thought content normal.       MDM      Procedures    Work up reassuring- vitals are stable, labs all WNL, CXR clear, urine clear, lactate normal, flu/strep negative. Discussed results with patient and family. They are agreeable with plan- supportive care at home, rest/fluids. Will return if symptoms/condition worsening          Electronically signed  by Lanell PersonsMarks, Cara, MD at 12/20/2017 11:54 PM EST

## 2017-12-20 NOTE — ED Triage Notes (Signed)
Formatting of this note might be different from the original.  Pt presents to the treatment room via w/c.  Pt able to stand without assistance.  Pt rpts weakness since last night with fever/chills.  + sore throat; denies cough.  Denies urinary symptoms.  Electronically signed by Lorenda Ishiharaoman, Robin L, RN at 12/20/2017 10:54 PM EST

## 2017-12-20 NOTE — ED Provider Notes (Signed)
HPI     76 year old female with cholesterol, HTN, up visiting from [place], presents with fever, body aches, weakness since early this morning. Reports mild sore throat. No headache/neck pain, no chest pain, cough or congestion. Denies abdominal pain, nausea/vomiting. Denies urinary symptoms. Denies back/flank pain. No sick contacts. Did get her flu shot. Denies rash. States she had seen her doctor about a pimple on her left leg but it isn't present today. Took tylenol earlier this afternoon. Daughter reports fever 101 at home.     Past Medical History:   Diagnosis Date   ??? Ill-defined condition     elevated cholesterol       History reviewed. No pertinent surgical history.      History reviewed. No pertinent family history.    Social History     Socioeconomic History   ??? Marital status: Not on file     Spouse name: Not on file   ??? Number of children: Not on file   ??? Years of education: Not on file   ??? Highest education level: Not on file   Social Needs   ??? Financial resource strain: Not on file   ??? Food insecurity - worry: Not on file   ??? Food insecurity - inability: Not on file   ??? Transportation needs - medical: Not on file   ??? Transportation needs - non-medical: Not on file   Occupational History   ??? Not on file   Tobacco Use   ??? Smoking status: Never Smoker   ??? Smokeless tobacco: Never Used   Substance and Sexual Activity   ??? Alcohol use: No     Frequency: Never   ??? Drug use: Not on file   ??? Sexual activity: Not on file   Other Topics Concern   ??? Not on file   Social History Narrative   ??? Not on file         ALLERGIES: Patient has no known allergies.    Review of Systems   Constitutional: Positive for chills, fatigue and fever. Negative for appetite change.   HENT: Positive for sore throat. Negative for congestion.    Eyes: Negative for visual disturbance.   Respiratory: Negative for cough and shortness of breath.    Cardiovascular: Negative for chest pain.    Gastrointestinal: Negative for abdominal pain, nausea and vomiting.   Endocrine: Negative for polyuria.   Genitourinary: Negative for dysuria.   Musculoskeletal: Negative for gait problem.   Skin: Negative for rash.   Neurological: Negative for headaches.   Psychiatric/Behavioral: Negative for dysphoric mood.       Vitals:    12/20/17 2256   BP: 148/74   Pulse: 92   Resp: 17   Temp: 99.9 ??F (37.7 ??C)   SpO2: 95%   Weight: 70.3 kg (155 lb)   Height: 5\' 8"  (1.727 m)            Physical Exam   Constitutional: She is oriented to person, place, and time. She appears well-developed and well-nourished. No distress.   Appears weak   HENT:   Head: Normocephalic and atraumatic.   Mouth/Throat: No oropharyngeal exudate.   Eyes: Pupils are equal, round, and reactive to light. Right eye exhibits no discharge. Left eye exhibits no discharge. No scleral icterus.   Neck: Normal range of motion. Neck supple. No JVD present.   Cardiovascular: Normal rate, regular rhythm and normal heart sounds.   No murmur heard.  Pulmonary/Chest: Effort normal and  breath sounds normal. No stridor. No respiratory distress. She has no wheezes. She has no rales. She exhibits no tenderness.   Abdominal: Soft. Bowel sounds are normal. She exhibits no distension and no mass. There is no tenderness. There is no rebound and no guarding.   Musculoskeletal: Normal range of motion.   Neurological: She is oriented to person, place, and time.   Skin: Skin is warm and dry. Capillary refill takes less than 2 seconds. No rash noted.   Psychiatric: She has a normal mood and affect. Her behavior is normal. Judgment and thought content normal.        MDM       Procedures    Work up reassuring- vitals are stable, labs all WNL, CXR clear, urine clear, lactate normal, flu/strep negative. Discussed results with patient and family. They are agreeable with plan- supportive care at home, rest/fluids. Will return if symptoms/condition worsening

## 2017-12-20 NOTE — ED Triage Notes (Signed)
Pt presents to the treatment room via w/c.  Pt able to stand without assistance.  Pt rpts weakness since last night with fever/chills.  + sore throat; denies cough.  Denies urinary symptoms.

## 2017-12-21 ENCOUNTER — Inpatient Hospital Stay
Admit: 2017-12-21 | Discharge: 2017-12-21 | Disposition: A | Discharge status: Home / Self Care | Payer: MEDICARE | Attending: Emergency Medicine

## 2017-12-21 LAB — INFLUENZA A & B AG (RAPID TEST)
Influenza A Antigen: NEGATIVE
Influenza B Antigen: NEGATIVE

## 2017-12-21 LAB — CBC WITH AUTOMATED DIFF
ABS. BASOPHILS: 0 10*3/uL (ref 0.0–0.1)
ABS. EOSINOPHILS: 0.1 10*3/uL (ref 0.0–0.4)
ABS. LYMPHOCYTES: 1.4 10*3/uL (ref 0.8–3.5)
ABS. MONOCYTES: 1.2 10*3/uL — ABNORMAL HIGH (ref 0.0–1.0)
ABS. NEUTROPHILS: 8.3 10*3/uL — ABNORMAL HIGH (ref 1.8–8.0)
BASOPHILS: 0 % (ref 0–1)
EOSINOPHILS: 1 % (ref 0–7)
HCT: 37.3 % (ref 35.0–47.0)
HGB: 11.9 g/dL (ref 11.5–16.0)
LYMPHOCYTES: 13 % (ref 12–49)
MCH: 30.1 PG (ref 26.0–34.0)
MCHC: 31.9 g/dL (ref 30.0–36.5)
MCV: 94.4 FL (ref 80.0–99.0)
MONOCYTES: 11 % (ref 5–13)
MPV: 10.1 FL (ref 8.9–12.9)
NEUTROPHILS: 75 % (ref 32–75)
PLATELET: 265 10*3/uL (ref 150–400)
RBC: 3.95 M/uL (ref 3.80–5.20)
RDW: 11.7 % (ref 11.5–14.5)
WBC: 10.9 10*3/uL (ref 3.6–11.0)
XXWBCSUS: 0

## 2017-12-21 LAB — METABOLIC PANEL, COMPREHENSIVE
A-G Ratio: 1 — ABNORMAL LOW (ref 1.1–2.2)
ALT (SGPT): 21 U/L (ref 12–78)
AST (SGOT): 13 U/L — ABNORMAL LOW (ref 15–37)
Albumin: 3.8 g/dL (ref 3.5–5.0)
Alk. phosphatase: 74 U/L (ref 45–117)
Anion gap: 11 mmol/L (ref 5–15)
BUN/Creatinine ratio: 13 (ref 12–20)
BUN: 11 MG/DL (ref 6–20)
Bilirubin, total: 0.4 MG/DL (ref 0.2–1.0)
CO2: 24 mmol/L (ref 21–32)
Calcium: 9.1 MG/DL (ref 8.5–10.1)
Chloride: 104 mmol/L (ref 97–108)
Creatinine: 0.83 MG/DL (ref 0.55–1.02)
GFR est AA: >60 mL/min/{1.73_m2} (ref >60)
GFR est non-AA: >60 mL/min/{1.73_m2} (ref >60)
Globulin: 3.9 g/dL (ref 2.0–4.0)
Glucose: 109 mg/dL — ABNORMAL HIGH (ref 65–100)
Potassium: 3.6 mmol/L (ref 3.5–5.1)
Protein, total: 7.7 g/dL (ref 6.4–8.2)
Sodium: 139 mmol/L (ref 136–145)

## 2017-12-21 LAB — STREP AG SCREEN, GROUP A: Group A Strep Ag ID: NEGATIVE

## 2017-12-21 LAB — URINALYSIS W/MICROSCOPIC
Bacteria: NEGATIVE /hpf
Bilirubin: NEGATIVE
Glucose: NEGATIVE mg/dL
Ketone: NEGATIVE mg/dL
Leukocyte Esterase: NEGATIVE
Nitrites: NEGATIVE
Protein: NEGATIVE mg/dL
Specific gravity: 1.015 (ref 1.003–1.030)
Urobilinogen: 0.2 EU/dL (ref 0.2–1.0)
pH (UA): 6.5 (ref 5.0–8.0)

## 2017-12-21 LAB — POC LACTIC ACID: Lactic Acid (POC): 1.17 mmol/L (ref 0.40–2.00)

## 2017-12-21 LAB — URINE CULTURE HOLD SAMPLE

## 2017-12-21 MED ORDER — IBUPROFEN 600 MG TAB
600 mg | ORAL | Status: AC
Start: 2017-12-21 — End: 2017-12-20
  Administered 2017-12-21: 04:00:00 via ORAL

## 2017-12-21 MED ORDER — SODIUM CHLORIDE 0.9% BOLUS IV
0.9 % | Freq: Once | INTRAVENOUS | Status: AC
Start: 2017-12-21 — End: 2017-12-21
  Administered 2017-12-21: 04:00:00 via INTRAVENOUS

## 2017-12-21 MED FILL — SODIUM CHLORIDE 0.9 % IV: INTRAVENOUS | Qty: 1000

## 2017-12-21 MED FILL — IBUPROFEN 600 MG TAB: 600 mg | ORAL | Qty: 1

## 2017-12-21 NOTE — ED Notes (Signed)
Formatting of this note might be different from the original.  The patient was discharged home by Dr Dawna PartMarks in stable condition. The patient is alert and oriented, in no respiratory distress and discharge vital signs obtained. The patient's diagnosis, condition and treatment were explained. The patient/family expressed understanding. No prescriptions given. No work/school note given. A discharge plan has been developed. A case manager was not involved in the process. Aftercare instructions were given.  Pt ambulatory out of the ED with family.    Electronically signed by Lorenda Ishiharaoman, Robin L, RN at 12/21/2017 12:08 AM EST

## 2017-12-21 NOTE — Progress Notes (Signed)
Formatting of this note might be different from the original.  I called and spoke with patient concerning throat culture.  ERX for amoxicillin 500 mg bid x 10 d to [facility] [address] per request  Electronically signed by Massie KluverGiglio, Michael D, PA-C at 12/23/2017 10:54 AM EST

## 2017-12-21 NOTE — Progress Notes (Signed)
I called and spoke with patient concerning throat culture.  ERX for amoxicillin 500 mg bid x 10 d to [facility] [address] per request

## 2017-12-21 NOTE — ED Notes (Signed)
The patient was discharged home by Dr Dawna PartMarks in stable condition. The patient is alert and oriented, in no respiratory distress and discharge vital signs obtained. The patient's diagnosis, condition and treatment were explained. The patient/family expressed understanding. No prescriptions given. No work/school note given. A discharge plan has been developed. A case manager was not involved in the process. Aftercare instructions were given.  Pt ambulatory out of the ED with family.

## 2017-12-21 NOTE — ED Notes (Cosign Needed)
Contacted by Darl PikesSusan in the lab with a lab result for the patient.     Nancie NeasMichael Giglio, PA has already prescribed the patient Amoxil 500mg  BID x 10 days    Marvia Picklesenee D Kihanna Kamiya, NP

## 2017-12-23 LAB — CULTURE, THROAT: Culture result:: NORMAL

## 2017-12-23 MED ORDER — AMOXICILLIN 500 MG TABLET
500 mg | ORAL_TABLET | Freq: Two times a day (BID) | ORAL | 0 refills | Status: AC
Start: 2017-12-23 — End: 2018-01-02

## 2018-02-03 ENCOUNTER — Other Ambulatory Visit: Payer: Self-pay | Admitting: Cardiology

## 2018-02-04 ENCOUNTER — Other Ambulatory Visit: Payer: Self-pay | Admitting: Cardiology

## 2018-02-04 DIAGNOSIS — R079 Chest pain, unspecified: Secondary | ICD-10-CM

## 2018-02-15 ENCOUNTER — Ambulatory Visit (HOSPITAL_COMMUNITY)
Admission: RE | Admit: 2018-02-15 | Discharge: 2018-02-15 | Disposition: A | Discharge status: Home / Self Care | Payer: Medicare Other | Source: Ambulatory Visit | Attending: Cardiology | Admitting: Cardiology

## 2018-02-15 DIAGNOSIS — R079 Chest pain, unspecified: Secondary | ICD-10-CM | POA: Diagnosis present

## 2018-02-15 LAB — BASIC METABOLIC PANEL
Anion gap: 8 (ref 5–15)
BUN: 5 mg/dL — ABNORMAL LOW (ref 6–20)
CHLORIDE: 109 mmol/L (ref 101–111)
CO2: 25 mmol/L (ref 22–32)
CREATININE: 0.7 mg/dL (ref 0.44–1.00)
Calcium: 9.3 mg/dL (ref 8.9–10.3)
GFR calc non Af Amer: >60 mL/min (ref >60)
Glucose, Bld: 124 mg/dL — ABNORMAL HIGH (ref 65–99)
Potassium: 3.5 mmol/L (ref 3.5–5.1)
Sodium: 142 mmol/L (ref 135–145)

## 2018-02-15 LAB — LIPID PANEL
Cholesterol: 151 mg/dL (ref 0–200)
HDL: 71 mg/dL (ref >40)
LDL Cholesterol: 60 mg/dL (ref 0–99)
Total CHOL/HDL Ratio: 2.1 RATIO
Triglycerides: 99 mg/dL (ref <150)
VLDL: 20 mg/dL (ref 0–40)

## 2018-02-15 LAB — HEPATIC FUNCTION PANEL
ALBUMIN: 3.8 g/dL (ref 3.5–5.0)
ALK PHOS: 65 U/L (ref 38–126)
ALT: 19 U/L (ref 14–54)
AST: 18 U/L (ref 15–41)
BILIRUBIN TOTAL: 0.6 mg/dL (ref 0.3–1.2)
Bilirubin, Direct: <0.1 mg/dL — ABNORMAL LOW (ref 0.1–0.5)
Total Protein: 7.5 g/dL (ref 6.5–8.1)

## 2018-02-15 MED ORDER — TECHNETIUM TC 99M TETROFOSMIN IV KIT
10.0000 | PACK | Freq: Once | INTRAVENOUS | Status: AC | PRN
  Administered 2018-02-15: 10 via INTRAVENOUS

## 2018-02-15 MED ORDER — REGADENOSON 0.4 MG/5ML IV SOLN
0.4000 mg | Freq: Once | INTRAVENOUS | Status: AC
  Administered 2018-02-15: 0.4 mg via INTRAVENOUS

## 2018-02-15 MED ORDER — TECHNETIUM TC 99M TETROFOSMIN IV KIT
30.0000 | PACK | Freq: Once | INTRAVENOUS | Status: AC | PRN
  Administered 2018-02-15: 30 via INTRAVENOUS

## 2018-02-15 MED ORDER — REGADENOSON 0.4 MG/5ML IV SOLN
INTRAVENOUS | Status: AC
Start: 2018-02-15 — End: 2018-02-15
  Filled 2018-02-15: qty 5

## 2018-02-16 LAB — VITAMIN D 25 HYDROXY (VIT D DEFICIENCY, FRACTURES): Vit D, 25-Hydroxy: 60.7 ng/mL (ref 30.0–100.0)

## 2018-02-21 ENCOUNTER — Encounter (HOSPITAL_COMMUNITY): Payer: Self-pay | Admitting: Emergency Medicine

## 2018-02-21 ENCOUNTER — Other Ambulatory Visit: Payer: Self-pay

## 2018-02-21 ENCOUNTER — Ambulatory Visit (HOSPITAL_COMMUNITY)
Admission: EM | Admit: 2018-02-21 | Discharge: 2018-02-21 | Disposition: A | Discharge status: Home / Self Care | Payer: Medicare Other | Attending: Physician Assistant | Admitting: Physician Assistant

## 2018-02-21 DIAGNOSIS — M542 Cervicalgia: Secondary | ICD-10-CM

## 2018-02-21 DIAGNOSIS — M62838 Other muscle spasm: Secondary | ICD-10-CM

## 2018-02-21 DIAGNOSIS — M62830 Muscle spasm of back: Secondary | ICD-10-CM | POA: Diagnosis not present

## 2018-02-21 MED ORDER — CYCLOBENZAPRINE HCL 10 MG PO TABS
5.0000 mg | ORAL_TABLET | Freq: Three times a day (TID) | ORAL | 0 refills | Status: DC | PRN
Start: 2018-02-21 — End: 2020-04-04

## 2018-02-21 MED ORDER — ACETAMINOPHEN ER 650 MG PO TBCR: 1300.0000 mg | EXTENDED_RELEASE_TABLET | Freq: Three times a day (TID) | ORAL | 0 refills | Status: DC | PRN

## 2018-02-21 NOTE — Discharge Instructions (Signed)
Take 1300 mg of Tylenol every 8 hours for pain.  This would not affect her heart or your stomach or your blood pressure.  Take the Flexeril sparingly and I would start with a half tab every 8 hours.

## 2018-02-21 NOTE — ED Triage Notes (Signed)
Pt reports neck and calf spasms bilaterally for one week.

## 2018-02-21 NOTE — ED Provider Notes (Signed)
02/21/2018 3:56 PM   DOB: 04/25/1941 / MRN: [id number]  SUBJECTIVE:  Nancy Vasquez is a 77 y.o. female presenting for neck pain that has been present for 3 weeks however has been worsening over the last week.  She used to take Aleve however has been advised from a cardiologist not to take this anymore.  She has had this problem in the past and has used Flexeril which has been somewhat helpful.  She denies weakness, paresthesia, change in dexterity, bowel and bladder dysfunction.  She also tells me that her posterior calves have been paining her bilaterally.  She denies leg swelling.  She is allergic to ibuprofen.   She  has a past medical history of Arthritis.    She  reports that  has never smoked. she has never used smokeless tobacco. She reports that she drinks alcohol. She  has no sexual activity history on file. The patient  has a past surgical history that includes Abdominal hysterectomy; LEFT HEART CATH AND CORONARY ANGIOGRAPHY (N/A, 03/17/2017); INTRAVASCULAR PRESSURE WIRE/FFR STUDY (N/A, 03/17/2017); and Breast excisional biopsy (Left).  Her family history is not on file.  Review of Systems  Constitutional: Negative for chills, diaphoresis and fever.  Eyes: Negative.   Respiratory: Negative for cough, hemoptysis, sputum production, shortness of breath and wheezing.   Cardiovascular: Negative for chest pain, orthopnea and leg swelling.  Gastrointestinal: Negative for nausea.  Skin: Negative for rash.  Neurological: Negative for dizziness, sensory change, speech change, focal weakness and headaches.    OBJECTIVE:  BP 116/74 (BP Location: Right Arm)   Pulse 67   Temp 98 F (36.7 C) (Oral)   SpO2 99%   Physical Exam  Constitutional: She is active.  Non-toxic appearance.  Cardiovascular: Normal rate, regular rhythm, S1 normal, S2 normal, normal heart sounds and intact distal pulses. Exam reveals no gallop, no friction rub and no decreased pulses.  No murmur  heard. Pulmonary/Chest: Effort normal. No stridor. No tachypnea. No respiratory distress. She has no wheezes. She has no rales.  Abdominal: She exhibits no distension.  Musculoskeletal: She exhibits no edema.       Cervical back: She exhibits decreased range of motion (R OM limited due to pain.), tenderness and spasm. She exhibits no bony tenderness, no swelling, no edema, no deformity, no laceration, no pain and normal pulse.       Legs: Neurological: She is alert.  Skin: Skin is warm and dry. She is not diaphoretic. No pallor.    No results found for this or any previous visit (from the past 72 hour(s)).  No results found.  ASSESSMENT AND PLAN:   Neck pain: Patient has recently been advised not to take NSAIDs.  She has had worsening neck pain since she stopped taking Aleve.  I will give her some Flexeril this is been helpful for her in the past.  I am advising her to take 1300 mg of Tylenol every 8 hours as needed for pain.  Trapezius muscle spasm      The patient is advised to call or return to clinic if she does not see an improvement in symptoms, or to seek the care of the closest emergency department if she worsens with the above plan.   Nancy Vasquez, MHS, PA-C 02/21/2018 3:56 PM    Ofilia Neaslark, Nancy Flury L, PA-C 02/21/18 1557

## 2018-08-23 ENCOUNTER — Other Ambulatory Visit: Payer: Self-pay | Admitting: Family Medicine

## 2018-08-23 DIAGNOSIS — Z1231 Encounter for screening mammogram for malignant neoplasm of breast: Secondary | ICD-10-CM

## 2018-09-21 ENCOUNTER — Ambulatory Visit
Admission: RE | Admit: 2018-09-21 | Discharge: 2018-09-21 | Disposition: A | Discharge status: Home / Self Care | Payer: Medicare Other | Source: Ambulatory Visit | Attending: Family Medicine | Admitting: Family Medicine

## 2018-09-21 DIAGNOSIS — Z1231 Encounter for screening mammogram for malignant neoplasm of breast: Secondary | ICD-10-CM

## 2019-01-06 NOTE — Progress Notes (Signed)
Associated Order(s): Large Joint Arthrocentesis: L knee  Post-Procedure Diagnose(s): Sprain of left knee, unspecified ligament, initial encounter; Primary osteoarthritis of left knee  Formatting of this note is different from the original.  EPIC MRN: [id number]     CARE TEAM:  Patient Care Team:  No Pcp as PCP - General (General Practice)    ASSESSMENT:  Impression:     ICD-10-CM   1. Sprain of left knee, unspecified ligament, initial encounter S83.92XA   2. Primary osteoarthritis of left knee M17.12   3. Knee injury, left, initial encounter S89.92XA     PLAN:  Treatment Plan:     1. Intra-articular injection into the left knee as per procedure note.  Patient tolerated well.  Aftercare instructions were discussed.    2. May use ice in 15-20 minutes intervals 3 times daily.  Patient was placed in a hinged knee brace that she has to use while ambulatory.    3. Use Tylenol extra-strength as needed for pain.    4. Patient is to arrange follow-up with her personal Ortho specialist upon her return home to [place] in 3 days.    Orders Placed This Encounter   Procedures   ? Large Joint Arthrocentesis   ? BASIC ECONOMY HINGE DON-JOY (H7342)   ? X-ray knee left 4+ views ([id number])   ? BMI >=25 PATIENT INSTRUCTIONS & EDUCATION   ? BP Elevated Patient Education & Instructions     Kristi Orozco was prescribed a functional hinged knee brace for their   1. Sprain of left knee, unspecified ligament, initial encounter    2. Primary osteoarthritis of left knee    3. Knee injury, left, initial encounter    .  Kristi Orozco has weakness and instability of the left extremity which requires stabilization from this semi-rigid orthosis to improve their function during activities of daily living.  Supervising Physician: Milford Cage    HISTORY OF PRESENT ILLNESS:  Chief Complaint: Injury of the Left Knee   Age: 78 y.o.  Sex: female   Hand-dominance: right     History of present illness: Kristi Orozco presents today for evaluation of  left knee pain.  Patient states she was injured earlier today when she fell down 3 steps while walking to the bathroom in the dark.  She is here visiting from [place] and leaving to go back home in 3 days.  Patient states that the pain is global and rates it as severe.  Pain is described as a soreness,  which is continuous.  She has been using ice.  She also notes some swelling.  Patient describes a baseline sensation of instability in the knee.  She has not had any procedures or injections in this knee.  She has a known history of osteoarthritis.    REVIEW OF SYSTEMS:  Review of Systems   01/06/2019    Constitutional: Unexplained: Negative  Genitourinary: Frequent Urination: Negative  HEENT: Vision Loss: Negative  Neurological: Memory Loss: Negative  Integumentary: Rash: Negative  Cardiovascular: Palpatations: Negative  Hematologic: Bruises/Bleeds Easily: Negative  Gastrointestinal: Constipation: Positive  Immunological: Seasonal Allergies: Negative  Musculoskeletal: Joint Pain: Positive    OBJECTIVE:  Constitutional:  No acute distress. Her body mass index is 29.18 kg/m.   Eyes:  Sclera are nonicteric.  Respiratory:  No labored breathing.  Cardiovascular:  No marked edema.  Skin:  No marked skin ulcers.  Neurological:  No marked sensory loss noted.  Psychiatric: Alert and oriented x3.  Left Knee Exam     Tenderness   The patient is experiencing tenderness in the lateral joint line and medial joint line.    Range of Motion   Extension: 5   Flexion: 90     Tests   Varus: negative Valgus: negative  Lachman:  Anterior - negative    Posterior - negative  Drawer:  Anterior - negative     Posterior - negative  Patellar apprehension: negative    Other   Erythema: absent  Sensation: normal  Pulse: present  Swelling: none  Effusion: no effusion present        IMAGING / STUDIES:   Order: XR KNEE 4+ VW LEFT - Indication: Knee injury, left, initial   encounter     X-ray Knee Left 4+ Views ([id number])    Result Date:  01/06/2019  Shielding: N/A. Non-Weight Bearing. Views: AP, Lat, [place], [place].     Impression: Indication: Left knee pain Impression: Degenerative changes present, but no acute bony abnormalities seen. Significant tricompartment DJD noted with significant loss of the medial compartment.       PROCEDURES:  Large Joint Arthrocentesis: L knee  Consent given by: patient  Site marked: site marked  Timeout: Immediately prior to procedure a time out was called to verify the correct patient, procedure, equipment, support staff and site/side marked as required   Supporting Documentation  Indications: pain   Procedure Details  Location: Knee L knee   Preparation: Patient was prepped and draped in the usual sterile fashion, ethylchloride used.Needle size: 25 G  Approach: lateral  Patient tolerance: patient tolerated the procedure well with no immediate complications    Medications administered: 1 mL Triamcinolone 40 MG/ML; 3 mL lidocaine 1 %    Patient Education: Cortisone Flare Risk Discussed      Electronically signed by Burt Ek, NP at 01/06/2019  3:32 PM EST

## 2019-08-10 ENCOUNTER — Other Ambulatory Visit: Payer: Self-pay | Admitting: Family Medicine

## 2019-08-10 DIAGNOSIS — Z1231 Encounter for screening mammogram for malignant neoplasm of breast: Secondary | ICD-10-CM

## 2019-09-23 ENCOUNTER — Other Ambulatory Visit: Payer: Self-pay

## 2019-09-23 ENCOUNTER — Ambulatory Visit
Admission: RE | Admit: 2019-09-23 | Discharge: 2019-09-23 | Disposition: A | Discharge status: Home / Self Care | Payer: Medicare Other | Source: Ambulatory Visit | Attending: Family Medicine | Admitting: Family Medicine

## 2019-09-23 DIAGNOSIS — Z1231 Encounter for screening mammogram for malignant neoplasm of breast: Secondary | ICD-10-CM

## 2019-09-28 ENCOUNTER — Ambulatory Visit: Payer: Medicare Other | Admitting: Orthopedic Surgery

## 2019-09-30 ENCOUNTER — Encounter: Payer: Self-pay | Admitting: Orthopedic Surgery

## 2019-09-30 ENCOUNTER — Ambulatory Visit (INDEPENDENT_AMBULATORY_CARE_PROVIDER_SITE_OTHER): Payer: Medicare Other | Admitting: Orthopedic Surgery

## 2019-09-30 ENCOUNTER — Ambulatory Visit: Payer: Self-pay

## 2019-09-30 DIAGNOSIS — M25562 Pain in left knee: Secondary | ICD-10-CM

## 2019-09-30 DIAGNOSIS — M1712 Unilateral primary osteoarthritis, left knee: Secondary | ICD-10-CM | POA: Diagnosis not present

## 2019-09-30 MED ORDER — BUPIVACAINE HCL 0.25 % IJ SOLN
4.0000 mL | INTRAMUSCULAR | Status: AC | PRN
  Administered 2019-09-30: 16:00:00 4 mL via INTRA_ARTICULAR

## 2019-09-30 MED ORDER — LIDOCAINE HCL 1 % IJ SOLN
5.0000 mL | INTRAMUSCULAR | Status: AC | PRN
  Administered 2019-09-30: 5 mL

## 2019-09-30 MED ORDER — METHYLPREDNISOLONE ACETATE 40 MG/ML IJ SUSP
40.0000 mg | INTRAMUSCULAR | Status: AC | PRN
  Administered 2019-09-30: 16:00:00 40 mg via INTRA_ARTICULAR

## 2019-09-30 NOTE — Progress Notes (Signed)
Office Visit Note   Patient: Nancy Vasquez           Date of Birth: [birth year]           MRN: [id number] Visit Date: 09/30/2019 Requested by: Katherina Mires, MD [address] [place],  [place] [address] PCP: Katherina Mires, MD  Subjective: Chief Complaint  Patient presents with  . Right Knee - Pain    HPI: Nancy Vasquez is a patient with left knee pain.  She was doing well until February of this year when she fell.  Has had worsening knee pain since that time which she localizes to the posterior lateral aspect of the left knee.  Hard for her to bend.  She is been evaluated by other providers who recommended L-spine MRI.  The injury happened when she was in [place].  She has had a cortisone injection which helped for only 1 day.  She states she is allergic to ibuprofen but has taken Aleve in the past with some improvement in symptoms.              ROS: All systems reviewed are negative as they relate to the chief complaint within the history of present illness.  Patient denies  fevers or chills.   Assessment & Plan: Visit Diagnoses:  1. Left knee pain, unspecified chronicity     Plan: Impression is posterior lateral left knee pain with pretty significant arthritis in the medial and patellofemoral joints.  This is not really where she is symptomatic.  This does not necessarily look like radicular pain but that is a consideration.  I think this is likely exacerbation of existing knee arthritis but her symptoms are not really localizing to the area of radiographic arthritis.  No nerve root tension signs today.  Plan is cortisone injection into the left knee an MRI scan of that posterior lateral corner region just to make sure there is no occult injury.  Unlikely that anything actionable will be found but that is the source of her pain.  Follow-up after that study  Follow-Up Instructions: Return for after MRI.   Orders:  Orders Placed This Encounter  Procedures  . XR KNEE  3 VIEW LEFT  . MR Knee Left w/o contrast   No orders of the defined types were placed in this encounter.     Procedures: Large Joint Inj: L knee on 09/30/2019 4:00 PM Indications: diagnostic evaluation, joint swelling and pain Details: 18 G 1.5 in needle, superolateral approach  Arthrogram: No  Medications: 5 mL lidocaine 1 %; 40 mg methylPREDNISolone acetate 40 MG/ML; 4 mL bupivacaine 0.25 % Outcome: tolerated well, no immediate complications Procedure, treatment alternatives, risks and benefits explained, specific risks discussed. Consent was given by the patient. Immediately prior to procedure a time out was called to verify the correct patient, procedure, equipment, support staff and site/side marked as required. Patient was prepped and draped in the usual sterile fashion.       Clinical Data: No additional findings.  Objective: Vital Signs: There were no vitals taken for this visit.  Physical Exam:   Constitutional: Patient appears well-developed HEENT:  Head: Normocephalic Eyes:EOM are normal Neck: Normal range of motion Cardiovascular: Normal rate Pulmonary/chest: Effort normal Neurologic: Patient is alert Skin: Skin is warm Psychiatric: Patient has normal mood and affect    Ortho Exam: Ortho exam demonstrates slightly antalgic gait to the left with palpable pedal pulses.  No effusion in the left knee.  She does  have about 5 degree flexion contracture and can bend it about 90 degrees.  Pretty significant pain to palpation around the hamstring attachment site on the fibular head.  Collateral cruciate ligaments are stable.  There is no effusion.  Extensor mechanism is intact.  Specialty Comments:  No specialty comments available.  Imaging: Xr Knee 3 View Left  Result Date: 09/30/2019 AP lateral merchant left knee reviewed.  End-stage tricompartmental arthritis is present worse on the medial and patellofemoral sides.  There is some maintenance of joint space but  with spurring in the lateral compartment.  No acute fracture or dislocation is present particularly around the fibular head region.  Alignment is mild varus    PMFS History: There are no active problems to display for this patient.  Past Medical History:  Diagnosis Date  . Arthritis    has had right hip replacement    Family History  Problem Relation Age of Onset  . Breast cancer Neg Hx     Past Surgical History:  Procedure Laterality Date  . ABDOMINAL HYSTERECTOMY    . BREAST EXCISIONAL BIOPSY Left   . INTRAVASCULAR PRESSURE WIRE/FFR STUDY N/A 03/17/2017   Procedure: Intravascular Pressure Wire/FFR Study;  Surgeon: Rinaldo Cloud, MD;  Location: [facility] INVASIVE CV LAB;  Service: Cardiovascular;  Laterality: N/A;  mid LAD  . LEFT HEART CATH AND CORONARY ANGIOGRAPHY N/A 03/17/2017   Procedure: Left Heart Cath and Coronary Angiography;  Surgeon: Rinaldo Cloud, MD;  Location: [facility] INVASIVE CV LAB;  Service: Cardiovascular;  Laterality: N/A;   Social History   Occupational History  . Not on file  Tobacco Use  . Smoking status: Never Smoker  . Smokeless tobacco: Never Used  Substance and Sexual Activity  . Alcohol use: Yes  . Drug use: Not on file  . Sexual activity: Not on file

## 2019-10-19 ENCOUNTER — Ambulatory Visit: Payer: Medicare Other | Admitting: Orthopedic Surgery

## 2019-10-22 ENCOUNTER — Ambulatory Visit
Admission: RE | Admit: 2019-10-22 | Discharge: 2019-10-22 | Disposition: A | Discharge status: Home / Self Care | Payer: Medicare Other | Source: Ambulatory Visit | Attending: Orthopedic Surgery | Admitting: Orthopedic Surgery

## 2019-10-22 ENCOUNTER — Other Ambulatory Visit: Payer: Self-pay

## 2019-10-22 DIAGNOSIS — M25562 Pain in left knee: Secondary | ICD-10-CM

## 2019-10-26 ENCOUNTER — Ambulatory Visit: Payer: Medicare Other | Admitting: Orthopedic Surgery

## 2019-10-28 ENCOUNTER — Ambulatory Visit (INDEPENDENT_AMBULATORY_CARE_PROVIDER_SITE_OTHER): Payer: Medicare Other | Admitting: Orthopedic Surgery

## 2019-10-28 ENCOUNTER — Other Ambulatory Visit: Payer: Self-pay

## 2019-10-28 ENCOUNTER — Encounter: Payer: Self-pay | Admitting: Orthopedic Surgery

## 2019-10-28 ENCOUNTER — Telehealth: Payer: Self-pay

## 2019-10-28 DIAGNOSIS — M1712 Unilateral primary osteoarthritis, left knee: Secondary | ICD-10-CM

## 2019-10-28 NOTE — Progress Notes (Signed)
Office Visit Note   Patient: Nancy Vasquez           Date of Birth: [birth year]           MRN: [id number] Visit Date: 10/28/2019 Requested by: Katherina Mires, MD [address] [place],  [place] [address] PCP: Katherina Mires, MD  Subjective: Chief Complaint  Patient presents with  . Follow-up    HPI: Nancy Vasquez is a 78 year old patient with left knee pain.  Since have seen her she has had an MRI scan which is reviewed.  The scan does show significant osteoarthritis with no real arthroscopically treatable pathologies in the knee.  Fairly severe patellofemoral arthritis along with medial and lateral compartment arthritis.  Severe degeneration of the medial meniscus present.  Small amount of fairly insignificant tearing of the anterior horn lateral meniscus.  In general the decision was for or against any type of arthroscopic intervention which could help the knee but I do not think with this amount of wear-and-tear that is present that arthroscopic intervention is indicated.  To that end she has had good results with cortisone injection in the past.              ROS: All systems reviewed are negative as they relate to the chief complaint within the history of present illness.  Patient denies  fevers or chills.   Assessment & Plan: Visit Diagnoses:  1. Arthritis of left knee     Plan: Plan is left knee arthritis with no real treatable arthroscopic lesions in the knee which would give her predictable pain relief.  Total knee replacement is really her only surgical option which she wants to avoid at this time.  I think we should try gel injection in the knee within the next 2 to 3 weeks.  We can alternate that with cortisone injections and gel injections every 3 months.  Patient agreeable to the plan.  We will see her back once we get the gel injection approved.  Follow-Up Instructions: Return in about 3 weeks (around 11/18/2019).   Orders:  No orders of the defined types  were placed in this encounter.  No orders of the defined types were placed in this encounter.     Procedures: No procedures performed   Clinical Data: No additional findings.  Objective: Vital Signs: There were no vitals taken for this visit.  Physical Exam:   Constitutional: Patient appears well-developed HEENT:  Head: Normocephalic Eyes:EOM are normal Neck: Normal range of motion Cardiovascular: Normal rate Pulmonary/chest: Effort normal Neurologic: Patient is alert Skin: Skin is warm Psychiatric: Patient has normal mood and affect    Ortho Exam: Ortho exam demonstrates full active and passive range of motion of that left knee with medial greater than lateral joint line tenderness and mild patellofemoral crepitus.  No groin pain with internal X rotation leg.  No other masses lymphadenopathy or skin changes noted in that left knee region.  Specialty Comments:  No specialty comments available.  Imaging: No results found.   PMFS History: There are no active problems to display for this patient.  Past Medical History:  Diagnosis Date  . Arthritis    has had right hip replacement    Family History  Problem Relation Age of Onset  . Breast cancer Neg Hx     Past Surgical History:  Procedure Laterality Date  . ABDOMINAL HYSTERECTOMY    . BREAST EXCISIONAL BIOPSY Left   . INTRAVASCULAR PRESSURE WIRE/FFR STUDY N/A  03/17/2017   Procedure: Intravascular Pressure Wire/FFR Study;  Surgeon: Rinaldo Cloud, MD;  Location: [facility] INVASIVE CV LAB;  Service: Cardiovascular;  Laterality: N/A;  mid LAD  . LEFT HEART CATH AND CORONARY ANGIOGRAPHY N/A 03/17/2017   Procedure: Left Heart Cath and Coronary Angiography;  Surgeon: Rinaldo Cloud, MD;  Location: [facility] INVASIVE CV LAB;  Service: Cardiovascular;  Laterality: N/A;   Social History   Occupational History  . Not on file  Tobacco Use  . Smoking status: Never Smoker  . Smokeless tobacco: Never Used  Substance and Sexual  Activity  . Alcohol use: Yes  . Drug use: Not on file  . Sexual activity: Not on file

## 2019-10-28 NOTE — Telephone Encounter (Signed)
Can we get auth for left knee gel injection? 

## 2019-11-01 NOTE — Telephone Encounter (Signed)
Submitted online [id number], left knee Durolane, pending VOB.

## 2019-11-02 NOTE — Telephone Encounter (Signed)
Will you please call patient and tell her the below info and schedule her with Dr Marlou Sa?  For left knee injection Durolane with Dr Marlou Sa.  Thank you!  No copay, and patient will owe 20% out of pocket cost, approx $200- this is only an estimate. She will be billed after insurance pays.  No payment at date of service.

## 2019-11-07 NOTE — Telephone Encounter (Signed)
I advised patient of the message below.  She stated that she is not in the position to pay that amount.  She will call us back when she wants to schedule a cortisone injection.  Thank you.

## 2019-11-23 ENCOUNTER — Other Ambulatory Visit: Payer: Self-pay

## 2019-11-23 ENCOUNTER — Ambulatory Visit: Payer: Medicare Other | Admitting: Orthopedic Surgery

## 2019-11-23 DIAGNOSIS — M1712 Unilateral primary osteoarthritis, left knee: Secondary | ICD-10-CM | POA: Diagnosis not present

## 2019-11-26 ENCOUNTER — Encounter: Payer: Self-pay | Admitting: Orthopedic Surgery

## 2019-11-26 NOTE — Progress Notes (Signed)
Office Visit Note   Patient: Nancy Vasquez           Date of Birth: 06/27/1941           MRN: [id number] Visit Date: 11/23/2019 Requested by: Katherina Mires, MD [address] [place],  [place] [address] PCP: Katherina Mires, MD  Subjective: Chief Complaint  Patient presents with  . Left Knee - Pain    HPI: Nancy Vasquez is a 78 y.o. female who presents to the office complaining of left knee pain.  Patient has history of left knee arthritis.  She endorses constant pain, pain at rest, waking with pain.  She takes occasional Aleve to relieve her pain.  She denies any groin pain or mechanical knee symptoms.  She does note back pain with radicular leg pain to her lateral calf.  She returns to the clinic for left knee gel injection.  Patient denies any major medical history.  Her cardiologist is Dr. Glennon Mac but she denies any history of atrial fibrillation, heart surgery, major hospitalizations.  Denies any history of diabetes, smoking, personal/family history of blood clots.                ROS:  All systems reviewed are negative as they relate to the chief complaint within the history of present illness.  Patient denies fevers or chills.  Assessment & Plan: Visit Diagnoses:  1. Arthritis of left knee     Plan: Patient is a 78 year old female who presents with left knee arthritis.  She returns to the clinic for left knee gel injection.  Discussed total knee arthroplasty along with the risks and benefits and recovery timeline.  After lengthy discussion patient wants to postpone surgery as long as she can.  Left knee gel injection given today and patient tolerated the procedure well.  Patient will follow up in 3 months for left knee cortisone injection.  Her last cortisone injection was on 09/30/2019.  Follow-Up Instructions: No follow-ups on file.   Orders:  No orders of the defined types were placed in this encounter.  No orders of the defined types were placed in  this encounter.     Procedures: Large Joint Inj: L knee on 11/27/2019 10:10 PM Indications: diagnostic evaluation, joint swelling and pain Details: 18 G 1.5 in needle, superolateral approach  Arthrogram: No  Medications: 5 mL lidocaine 1 %; 4 mL bupivacaine 0.25 %; 16.8 mg Sodium Hyaluronate (Viscosup) 16.8 MG/2ML Outcome: tolerated well, no immediate complications Procedure, treatment alternatives, risks and benefits explained, specific risks discussed. Consent was given by the patient. Immediately prior to procedure a time out was called to verify the correct patient, procedure, equipment, support staff and site/side marked as required. Patient was prepped and draped in the usual sterile fashion.       Clinical Data: No additional findings.  Objective: Vital Signs: There were no vitals taken for this visit.  Physical Exam:  Constitutional: Patient appears well-developed HEENT:  Head: Normocephalic Eyes:EOM are normal Neck: Normal range of motion Cardiovascular: Normal rate Pulmonary/chest: Effort normal Neurologic: Patient is alert Skin: Skin is warm Psychiatric: Patient has normal mood and affect  Ortho Exam:  Left knee Exam No effusion Extensor mechanism intact No TTP over the quad tendon, patellar tendon, pes anserinus, patella, tibial tubercle, LCL/MCL insertions TTP over the medial and lateral joint lines Stable to varus/valgus stresses.  Stable to anterior/posterior drawer Extension to 0 degrees Flexion > 90 degrees  Specialty Comments:  No  specialty comments available.  Imaging: No results found.   PMFS History: There are no active problems to display for this patient.  Past Medical History:  Diagnosis Date  . Arthritis    has had right hip replacement    Family History  Problem Relation Age of Onset  . Breast cancer Neg Hx     Past Surgical History:  Procedure Laterality Date  . ABDOMINAL HYSTERECTOMY    . BREAST EXCISIONAL BIOPSY Left    . INTRAVASCULAR PRESSURE WIRE/FFR STUDY N/A 03/17/2017   Procedure: Intravascular Pressure Wire/FFR Study;  Surgeon: Rinaldo Cloud, MD;  Location: [facility] INVASIVE CV LAB;  Service: Cardiovascular;  Laterality: N/A;  mid LAD  . LEFT HEART CATH AND CORONARY ANGIOGRAPHY N/A 03/17/2017   Procedure: Left Heart Cath and Coronary Angiography;  Surgeon: Rinaldo Cloud, MD;  Location: [facility] INVASIVE CV LAB;  Service: Cardiovascular;  Laterality: N/A;   Social History   Occupational History  . Not on file  Tobacco Use  . Smoking status: Never Smoker  . Smokeless tobacco: Never Used  Substance and Sexual Activity  . Alcohol use: Yes  . Drug use: Not on file  . Sexual activity: Not on file

## 2019-11-27 DIAGNOSIS — M1712 Unilateral primary osteoarthritis, left knee: Secondary | ICD-10-CM

## 2019-11-27 MED ORDER — BUPIVACAINE HCL 0.25 % IJ SOLN
4.0000 mL | INTRAMUSCULAR | Status: AC | PRN
  Administered 2019-11-27: 4 mL via INTRA_ARTICULAR

## 2019-11-27 MED ORDER — LIDOCAINE HCL 1 % IJ SOLN
5.0000 mL | INTRAMUSCULAR | Status: AC | PRN
  Administered 2019-11-27: 5 mL

## 2019-11-27 MED ORDER — SODIUM HYALURONATE (VISCOSUP) 16.8 MG/2ML IX SOSY
16.8000 mg | PREFILLED_SYRINGE | INTRA_ARTICULAR | Status: AC | PRN
  Administered 2019-11-27: 16.8 mg via INTRA_ARTICULAR

## 2019-12-08 ENCOUNTER — Telehealth: Payer: Self-pay | Admitting: Orthopedic Surgery

## 2019-12-08 NOTE — Telephone Encounter (Signed)
Dr Marlou Sa please provide blue sheet and I can give to [place] to move forward.

## 2019-12-08 NOTE — Telephone Encounter (Signed)
Patient called and stated she wants to move forward for surgery but wanted to know what she needed to do on her end.  Please call patient [phone]

## 2019-12-09 NOTE — Telephone Encounter (Signed)
Blue sheert done

## 2019-12-09 NOTE — Telephone Encounter (Signed)
Surgery scheduling sheet given to [place].

## 2019-12-21 ENCOUNTER — Telehealth: Payer: Self-pay | Admitting: Orthopedic Surgery

## 2019-12-21 NOTE — Telephone Encounter (Signed)
Patient called requesting an appointment to have an xray of her left knee prior to scheduling her surgery.  Appointment made with Annie Main, PA on 01-02-20.  Patient would like to Dr Marlou Sa to compare old xrays to new xrays to find out if there are notable changes.    Surgery sheet indicates she would need to be scheduled for a Left Total knee.

## 2020-01-02 ENCOUNTER — Ambulatory Visit: Payer: Medicare Other | Admitting: Surgical

## 2020-02-14 ENCOUNTER — Ambulatory Visit: Admit: 2020-02-14 | Payer: Medicare Other | Admitting: Orthopedic Surgery

## 2020-02-14 SURGERY — ARTHROPLASTY, KNEE, TOTAL
Anesthesia: Spinal | Site: Knee | Laterality: Left

## 2020-02-18 ENCOUNTER — Ambulatory Visit: Payer: Medicare Other

## 2020-02-20 IMAGING — MR MR KNEE*L* W/O CM
4 of 6 series · 20 of 40 positions shown · non-contrast
Comparison: None.

CLINICAL DATA: Left knee pain status post fall January 2019.
Lateral and posterior pain.

EXAM:
MRI OF THE LEFT KNEE WITHOUT CONTRAST
TECHNIQUE: Multiplanar, multisequence MR imaging of the knee was performed. No
intravenous contrast was administered.

[Series 3: T2 fat-sat · axial · 4.0mm · 0.50mm/px · z∈[-31,+74]mm · 3 of 27 slices shown (1 of 2)]
[im 6/27]
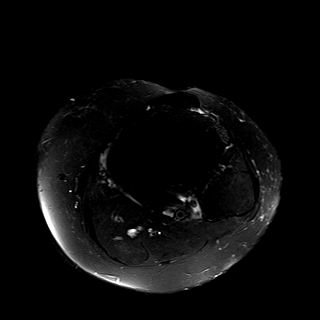
[im 16/27]
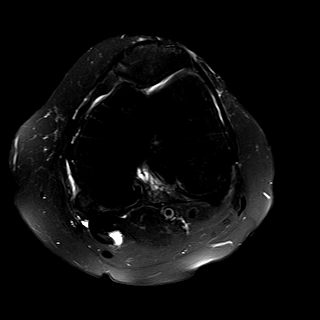
[im 27/27]
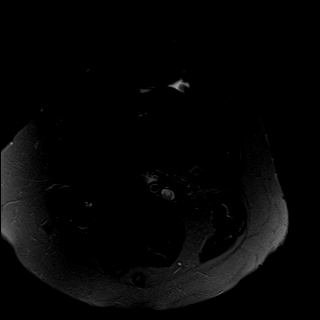

[Series 5: T2 fat-sat · coronal · 4.0mm · 0.31mm/px · 3 of 26 slices shown (2 of 2)]
[im 6/26]
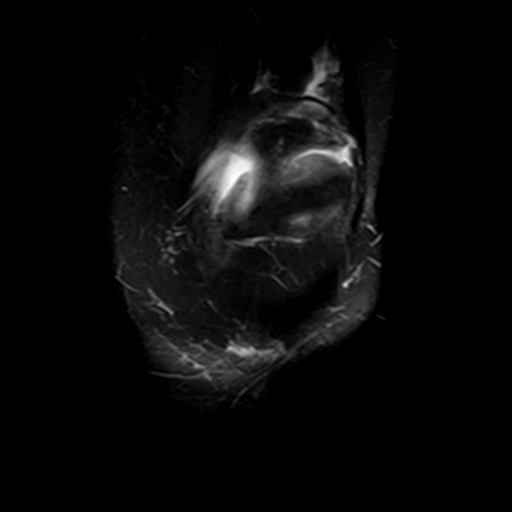
[im 16/26]
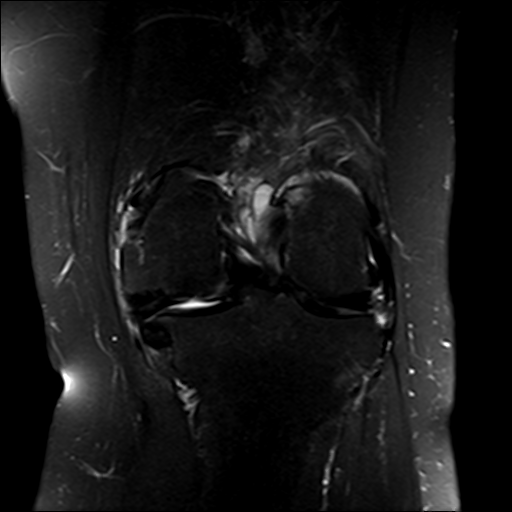
[im 26/26]
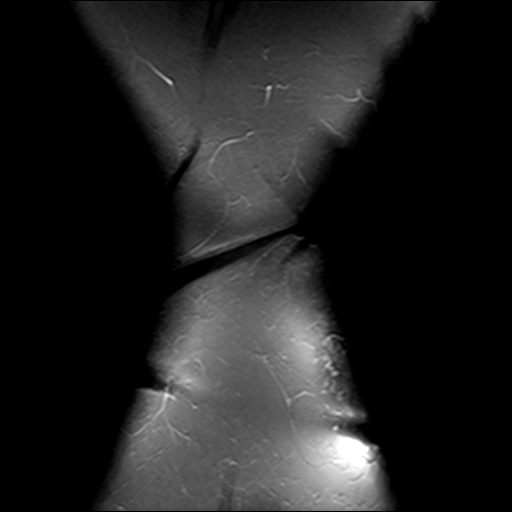

[Series 6: PD fat-sat · coronal · 3.0mm · 0.31mm/px · 8 of 32 slices shown (1 of 2)]
[im 1/32]
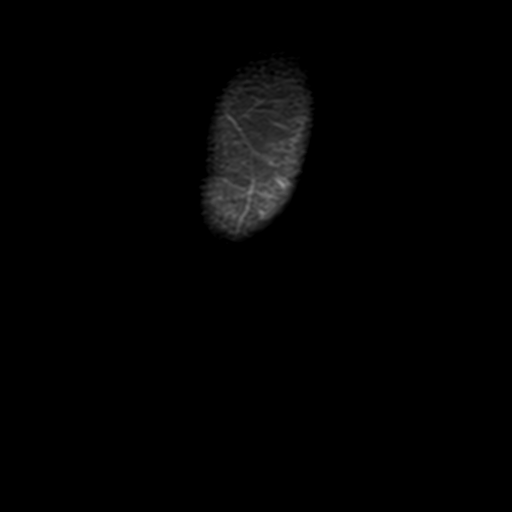
[im 5/32]
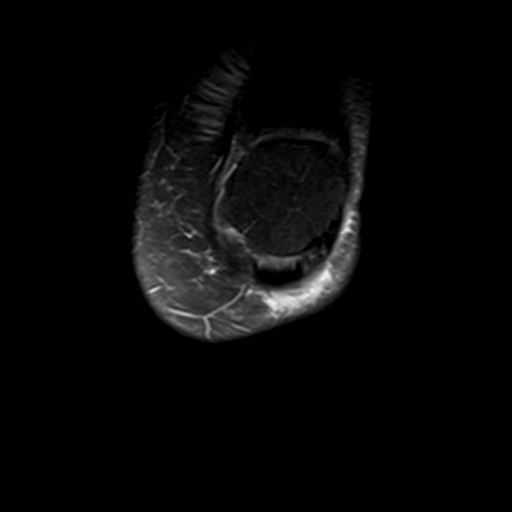
[im 9/32]
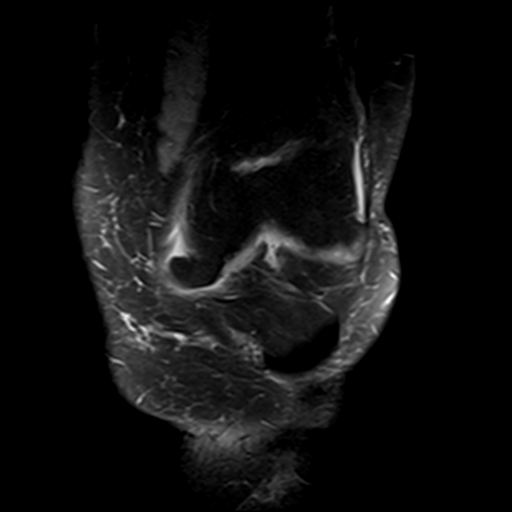
[im 14/32]
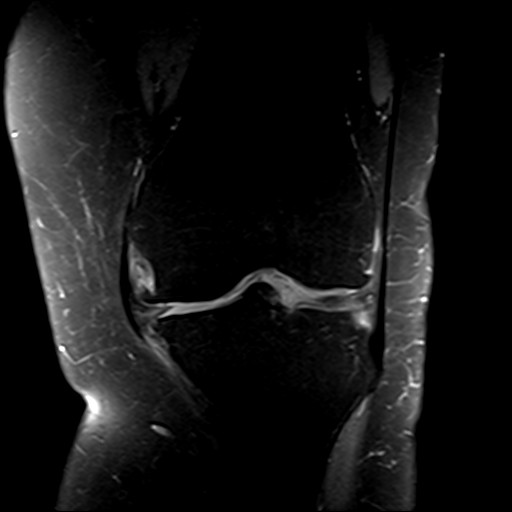
[im 18/32]
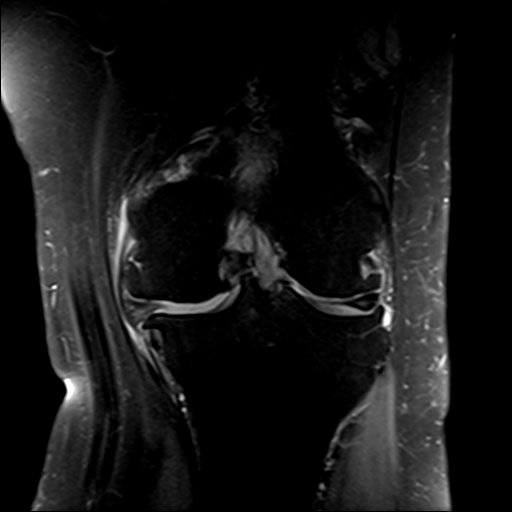
[im 23/32]
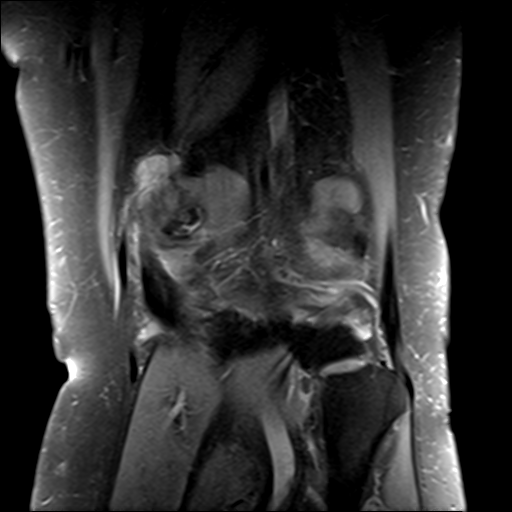
[im 27/32]
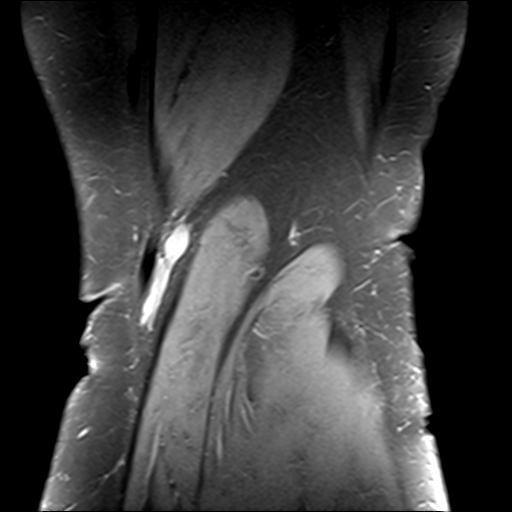
[im 32/32]
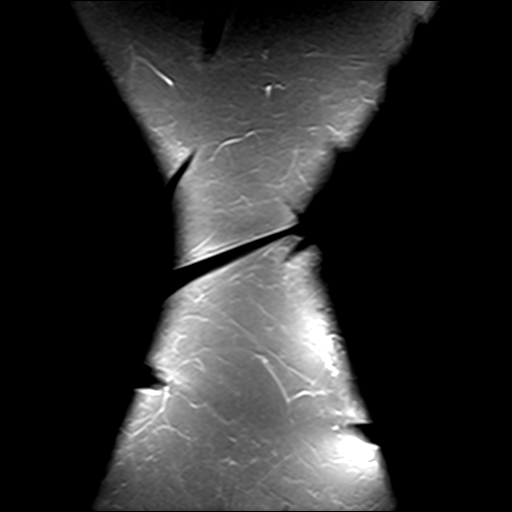

[Series 8: PD fat-sat · sagittal · 3.0mm · 0.29mm/px · 6 of 28 slices shown (2 of 2)]
[im 1/28]
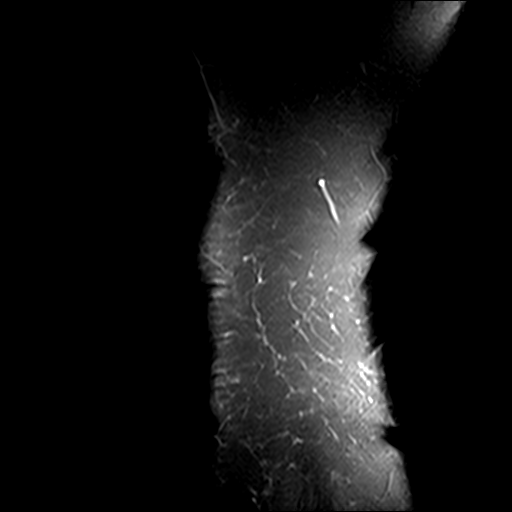
[im 5/28]
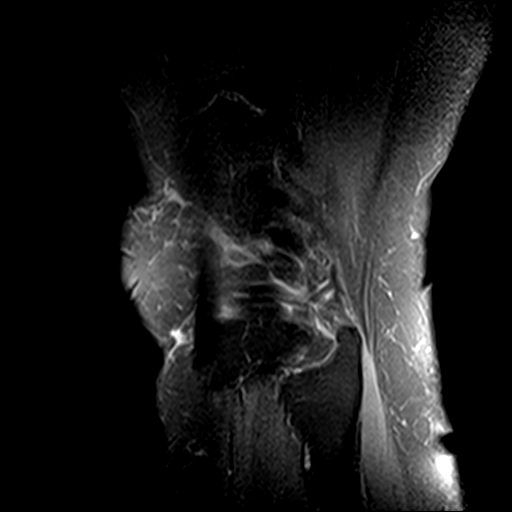
[im 10/28]
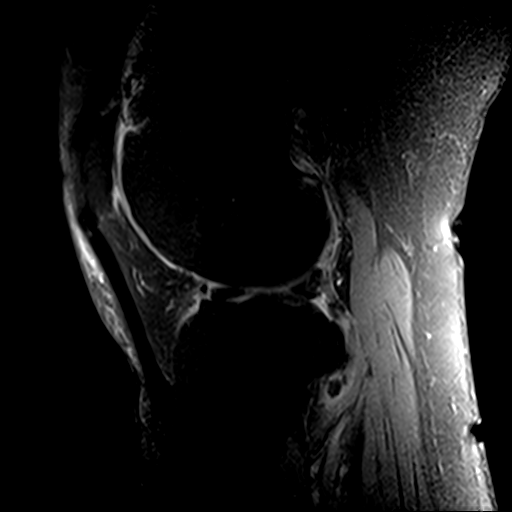
[im 14/28]
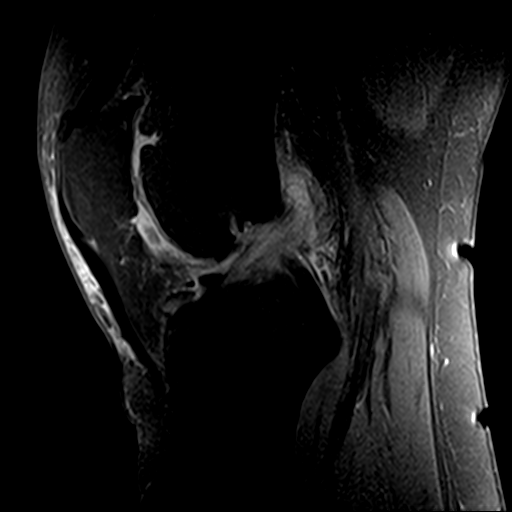
[im 19/28]
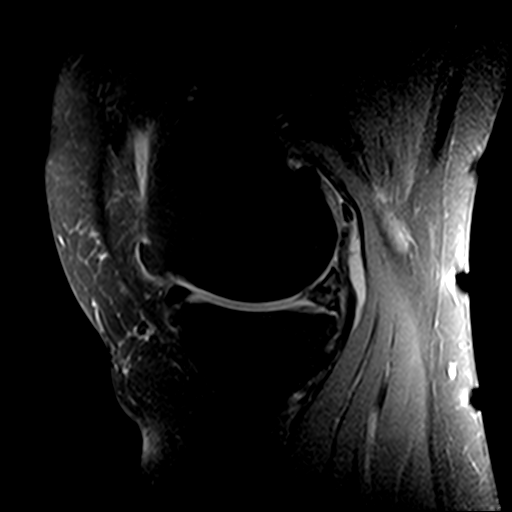
[im 23/28]
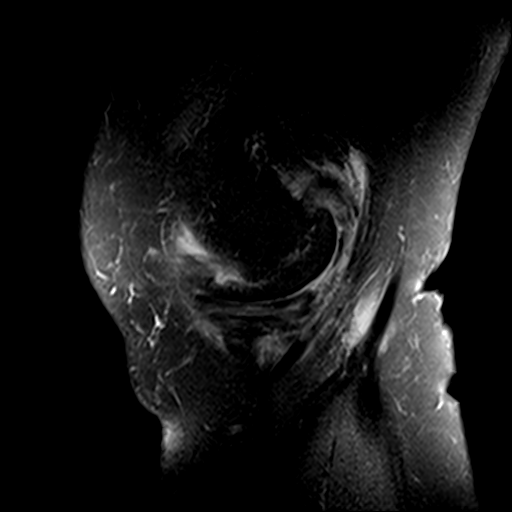

[20 of 40 positions shown; findings below may reference images not displayed]

FINDINGS: MENISCI

Medial meniscus: Severe degeneration of the body of the medial
meniscus with a small radial tear of the free edge of the body.
Severe cystic expansion of the posterior horn of the medial meniscus
consistent with a intrameniscal cyst. Partial radial tear of the
posterior horn of the medial meniscus at the meniscal root with
peripheral meniscal extrusion.

Lateral meniscus: Small undersurface tear of the anterior horn of
the lateral meniscus. Tiny radial tear of the free edge of the
posterior horn of the lateral meniscus. Degeneration of the
posterior horn and body of lateral meniscus.

LIGAMENTS

Cruciates: Intact ACL with severe expansion and increased signal
consistent with severe mucinous degeneration. Intact PCL.

Collaterals: Medial collateral ligament is intact. Lateral
collateral ligament complex is intact.

CARTILAGE

Patellofemoral: Extensive full-thickness cartilage loss of the
patellofemoral compartment with small marginal osteophytes.

Medial: Extensive full-thickness cartilage loss of medial
femorotibial compartment with small marginal osteophytes.

Lateral: Partial-thickness cartilage loss of the lateral femoral
condyle with small marginal osteophytes.

Joint: Small joint effusion. Normal Hoffa's fat. No plical
thickening.

Popliteal Fossa:  Small Baker's cyst.  Intact popliteus tendon.

Extensor Mechanism: Intact quadriceps tendon. Intact patellar
tendon. Intact medial patellar retinaculum. Intact lateral patellar
retinaculum. Intact MPFL.

Bones:  No acute osseous abnormality.  No aggressive osseous lesion.

Other: No fluid collection or hematoma.
IMPRESSION: 1. Severe advanced osteoarthritis with extensive cartilage loss of
patellofemoral compartment and medial femorotibial compartment.
2. Severe degeneration of the body of the medial meniscus with a
small radial tear of the free edge of the body. Severe cystic
expansion of the posterior horn of the medial meniscus consistent
with a intrameniscal cyst. Partial radial tear of the posterior horn
of the medial meniscus at the meniscal root with peripheral meniscal
extrusion.
3. Small undersurface tear of the anterior horn of the lateral
meniscus. Tiny radial tear of the free edge of the posterior horn of
the lateral meniscus. Degeneration of the posterior horn and body of
lateral meniscus.

## 2020-02-23 ENCOUNTER — Ambulatory Visit (INDEPENDENT_AMBULATORY_CARE_PROVIDER_SITE_OTHER): Payer: Medicare Other

## 2020-02-23 ENCOUNTER — Ambulatory Visit: Payer: Self-pay

## 2020-02-23 ENCOUNTER — Other Ambulatory Visit: Payer: Self-pay

## 2020-02-23 ENCOUNTER — Ambulatory Visit: Payer: Medicare Other | Admitting: Orthopedic Surgery

## 2020-02-23 DIAGNOSIS — M79605 Pain in left leg: Secondary | ICD-10-CM

## 2020-02-24 ENCOUNTER — Encounter: Payer: Self-pay | Admitting: Orthopedic Surgery

## 2020-02-24 NOTE — Progress Notes (Signed)
Office Visit Note   Patient: Nancy Vasquez           Date of Birth: 12/28/1941           MRN: [id number] Visit Date: 02/23/2020 Requested by: Katherina Mires, MD [address] [place],  [place] [address] PCP: Katherina Mires, MD  Subjective: Chief Complaint  Patient presents with  . Left Leg - Pain    HPI: Nancy Vasquez is a patient with left leg pain.  She states that she needs a hip replacement on the left but she fell off a stool onto her back 2 weeks ago and has been having a lot of pain since then.  Reports leg pain knee pain ankle pain back pain.  Denies any numbness and tingling.  Also reports pain in the buttock region.  Her sitting is improving.  Not taking any pain meds.  She does have some back pain when she is laying down.              ROS: All systems reviewed are negative as they relate to the chief complaint within the history of present illness.  Patient denies  fevers or chills.   Assessment & Plan: Visit Diagnoses:  1. Pain in left leg     Plan: Impression is left knee arthritis left hip arthritis.  Hip arthritis is relatively severe.  No evidence of acute fracture.  I think that is the main thing or not I was worried about today.  She may need knee replacement in the future.  She was scheduled for that but Covid flu issue caused her to reconsider.  If she wants to get that done we can.  She also has a fair amount of hip arthritis but no hip fracture today on the left-hand side.  Follow-Up Instructions: No follow-ups on file.   Orders:  Orders Placed This Encounter  Procedures  . XR Lumbar Spine 2-3 Views  . XR Ankle Complete Left  . XR Knee 1-2 Views Left  . XR HIP UNILAT W OR W/O PELVIS 2-3 VIEWS LEFT   No orders of the defined types were placed in this encounter.     Procedures: No procedures performed   Clinical Data: No additional findings.  Objective: Vital Signs: There were no vitals taken for this visit.  Physical Exam:    Constitutional: Patient appears well-developed HEENT:  Head: Normocephalic Eyes:EOM are normal Neck: Normal range of motion Cardiovascular: Normal rate Pulmonary/chest: Effort normal Neurologic: Patient is alert Skin: Skin is warm Psychiatric: Patient has normal mood and affect    Ortho Exam: Ortho exam demonstrates ability to weight-bear on both legs.  Pedal pulses palpable.  Leg lengths equal.  Patient has no bruising ecchymosis or swelling in the left ankle region.  Palpable intact nontender anterior to posterior to peroneal and Achilles tendons.  Left knee exam demonstrates slight varus alignment with no effusion.  Extensor mechanism is intact.  Collateral and cruciate ligaments are stable.  Lacks about 5 degrees of full extension and can flex to about 95.  Does have limited internal rotation on the left-hand side.  Hip flexion strength symmetric 5+ out of 5 bilateral.  No paresthesias L1 S1 bilaterally.  Some pain with forward lateral bending but no trochanteric tenderness is noted.  Specialty Comments:  No specialty comments available.  Imaging: No results found.   PMFS History: There are no problems to display for this patient.  Past Medical History:  Diagnosis Date  .  Arthritis    has had right hip replacement    Family History  Problem Relation Age of Onset  . Breast cancer Neg Hx     Past Surgical History:  Procedure Laterality Date  . ABDOMINAL HYSTERECTOMY    . BREAST EXCISIONAL BIOPSY Left   . INTRAVASCULAR PRESSURE WIRE/FFR STUDY N/A 03/17/2017   Procedure: Intravascular Pressure Wire/FFR Study;  Surgeon: Rinaldo Cloud, MD;  Location: [facility] INVASIVE CV LAB;  Service: Cardiovascular;  Laterality: N/A;  mid LAD  . LEFT HEART CATH AND CORONARY ANGIOGRAPHY N/A 03/17/2017   Procedure: Left Heart Cath and Coronary Angiography;  Surgeon: Rinaldo Cloud, MD;  Location: [facility] INVASIVE CV LAB;  Service: Cardiovascular;  Laterality: N/A;   Social History   Occupational  History  . Not on file  Tobacco Use  . Smoking status: Never Smoker  . Smokeless tobacco: Never Used  Substance and Sexual Activity  . Alcohol use: Yes  . Drug use: Not on file  . Sexual activity: Not on file

## 2020-02-27 ENCOUNTER — Ambulatory Visit: Payer: Medicare Other | Attending: Internal Medicine

## 2020-02-27 DIAGNOSIS — Z23 Encounter for immunization: Secondary | ICD-10-CM | POA: Insufficient documentation

## 2020-02-27 NOTE — Progress Notes (Signed)
   Covid-19 Vaccination Clinic  Name:  ROMAINE MACIOLEK    MRN: [id number] DOB: Aug 15, 1941  02/27/2020  Ms. Bromwell was observed post Covid-19 immunization for 15 minutes without incidence. She was provided with Vaccine Information Sheet and instruction to access the V-Safe system.   Ms. Bare was instructed to call 911 with any severe reactions post vaccine: Marland Kitchen Difficulty breathing  . Swelling of your face and throat  . A fast heartbeat  . A bad rash all over your body  . Dizziness and weakness    Immunizations Administered    Name Date Dose VIS Date Route   Pfizer COVID-19 Vaccine 02/27/2020 12:09 PM 0.3 mL 12/09/2019 Intramuscular   Manufacturer: [employer], [employer]   Lot: [id number]   NDC: [address]-0

## 2020-03-28 ENCOUNTER — Ambulatory Visit: Payer: Medicare Other | Attending: Internal Medicine

## 2020-03-28 DIAGNOSIS — Z23 Encounter for immunization: Secondary | ICD-10-CM

## 2020-03-28 NOTE — Progress Notes (Signed)
   Covid-19 Vaccination Clinic  Name:  Nancy Vasquez    MRN: [id number] DOB: 05-20-1941  03/28/2020  [name] was observed post Covid-19 immunization for 15 minutes without incident. She was provided with Vaccine Information Sheet and instruction to access the V-Safe system.   [name] was instructed to call 911 with any severe reactions post vaccine: Marland Kitchen Difficulty breathing  . Swelling of face and throat  . A fast heartbeat  . A bad rash all over body  . Dizziness and weakness   Immunizations Administered    Name Date Dose VIS Date Route   Pfizer COVID-19 Vaccine 03/28/2020 11:25 AM 0.3 mL 12/09/2019 Intramuscular   Manufacturer: [employer], [employer]   Lot: [id number]   NDC: [address]-8

## 2020-04-02 ENCOUNTER — Telehealth: Payer: Self-pay | Admitting: Orthopedic Surgery

## 2020-04-02 NOTE — Telephone Encounter (Signed)
Patient is scheduled for left total knee arthroplasty at [facility] on 04-19-20. She would like to discuss the fact that her gait is off because of the the hip surgery she had and wants to know if you can correct.  Please call [phone]

## 2020-04-03 NOTE — Telephone Encounter (Signed)
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

## 2020-04-03 NOTE — Telephone Encounter (Signed)
Pls advise. Thanks.  

## 2020-04-03 NOTE — Telephone Encounter (Signed)
Patient aware of the below from [facility], and I told her that at her post op appt they could maybe discuss an insert that could help that issue

## 2020-04-11 NOTE — Pre-Procedure Instructions (Addendum)
[id number] [place], [place] - [address] AT [facility] OF [facility] [address] Nancy Vasquez [place] [address] Phone: [phone] Fax: [phone]     Your procedure is scheduled on Thursday April 22nd.  Report to [facility] Main Entrance "A" at 10:15 A.M., and check in at the Admitting office.  Call this number if you have problems the morning of surgery:  [phone]  Call [phone] if you have any questions prior to your surgery date Monday-Friday 8am-4pm   Remember:  Do not eat or drink after midnight the night before your surgery  You may drink clear liquids until 9:15 am the morning of your surgery.   Clear liquids allowed are: Water, Non-Citrus Juices (without pulp), Carbonated Beverages, Clear Tea, Black Coffee Only, and Gatorade   Enhanced Recovery after Surgery for Orthopedics Enhanced Recovery after Surgery is a protocol used to improve the stress on your body and your recovery after surgery.  Patient Instructions  . The night before surgery:  o No food after midnight. ONLY clear liquids after midnight  .  Marland Kitchen The day of surgery (if you do NOT have diabetes):  o Drink ONE (1) Pre-Surgery Clear Ensure as directed.   o This drink was given to you during your hospital  pre-op appointment visit. o The pre-op nurse will instruct you on the time to drink the  Pre-Surgery Ensure depending on your surgery time. o Finish the drink at the designated time by the pre-op nurse.  o Nothing else to drink after completing the  Pre-Surgery Clear Ensure.         If you have questions, please contact your surgeon's office.   Take these medicines the morning of surgery with A SIP OF WATER   amLODipine (NORVASC)    atorvastatin (LIPITOR)  acetaminophen (TYLENOL 8 HOUR) as needed for pain  nitroGLYCERIN (NITROSTAT) as needed for chest pain    As of today, STOP taking any Aspirin (unless otherwise instructed by your surgeon) and Aspirin containing  products, Aleve, Naproxen, Ibuprofen, Motrin, Advil, Goody's, BC's, all herbal medications, fish oil, and all vitamins.                      Do not wear jewelry, make up, or nail polish            Do not wear lotions, powders, perfumes/colognes, or deodorant.            Do not shave 48 hours prior to surgery.  Men may shave face and neck.            Do not bring valuables to the hospital.            [facility] is not responsible for any belongings or valuables.  Do NOT Smoke (Tobacco/Vapping) or drink Alcohol 24 hours prior to your procedure If you use a CPAP at night, you may bring all equipment for your overnight stay.   Contacts, glasses, dentures or bridgework may not be worn into surgery.      For patients admitted to the hospital, discharge time will be determined by your treatment team.   Patients discharged the day of surgery will not be allowed to drive home, and someone needs to stay with them for 24 hours.  Special instructions:   [facility]- Preparing For Surgery  Before surgery, you can play an important role. Because skin is not sterile, your skin needs to be as free of  germs as possible. You can reduce the number of germs on your skin by washing with CHG (chlorahexidine gluconate) Soap before surgery.  CHG is an antiseptic cleaner which kills germs and bonds with the skin to continue killing germs even after washing.    Oral Hygiene is also important to reduce your risk of infection.  Remember - BRUSH YOUR TEETH THE MORNING OF SURGERY WITH YOUR REGULAR TOOTHPASTE  Please do not use if you have an allergy to CHG or antibacterial soaps. If your skin becomes reddened/irritated stop using the CHG.  Do not shave (including legs and underarms) for at least 48 hours prior to first CHG shower. It is OK to shave your face.  Please follow these instructions carefully.   1. Shower the NIGHT BEFORE SURGERY and the MORNING OF SURGERY with CHG Soap.   2. If you chose to wash your  hair, wash your hair first as usual with your normal shampoo.  3. After you shampoo, rinse your hair and body thoroughly to remove the shampoo.  4. Use CHG as you would any other liquid soap. You can apply CHG directly to the skin and wash gently with a scrungie or a clean washcloth.   5. Apply the CHG Soap to your body ONLY FROM THE NECK DOWN.  Do not use on open wounds or open sores. Avoid contact with your eyes, ears, mouth and genitals (private parts). Wash Face and genitals (private parts)  with your normal soap.   6. Wash thoroughly, paying special attention to the area where your surgery will be performed.  7. Thoroughly rinse your body with warm water from the neck down.  8. DO NOT shower/wash with your normal soap after using and rinsing off the CHG Soap.  9. Pat yourself dry with a CLEAN TOWEL.  10. Wear CLEAN PAJAMAS to bed the night before surgery, wear comfortable clothes the morning of surgery  11. Place CLEAN SHEETS on your bed the night of your first shower and DO NOT SLEEP WITH PETS.  Day of Surgery:   Do not apply any deodorants/lotions.  Please wear clean clothes to the hospital/surgery center.   Remember to brush your teeth WITH YOUR REGULAR TOOTHPASTE.   Please read over the following fact sheets that you were given.

## 2020-04-12 ENCOUNTER — Other Ambulatory Visit: Payer: Self-pay

## 2020-04-12 ENCOUNTER — Encounter (HOSPITAL_COMMUNITY): Payer: Self-pay

## 2020-04-12 ENCOUNTER — Encounter (HOSPITAL_COMMUNITY)
Admission: RE | Admit: 2020-04-12 | Discharge: 2020-04-12 | Disposition: A | Discharge status: Home / Self Care | Payer: Medicare Other | Source: Ambulatory Visit | Attending: Orthopedic Surgery | Admitting: Orthopedic Surgery

## 2020-04-12 DIAGNOSIS — E785 Hyperlipidemia, unspecified: Secondary | ICD-10-CM | POA: Diagnosis not present

## 2020-04-12 DIAGNOSIS — I10 Essential (primary) hypertension: Secondary | ICD-10-CM | POA: Insufficient documentation

## 2020-04-12 DIAGNOSIS — Z01818 Encounter for other preprocedural examination: Secondary | ICD-10-CM | POA: Diagnosis present

## 2020-04-12 DIAGNOSIS — I25119 Atherosclerotic heart disease of native coronary artery with unspecified angina pectoris: Secondary | ICD-10-CM | POA: Diagnosis not present

## 2020-04-12 DIAGNOSIS — Z79899 Other long term (current) drug therapy: Secondary | ICD-10-CM | POA: Insufficient documentation

## 2020-04-12 LAB — BASIC METABOLIC PANEL
Anion gap: 9 (ref 5–15)
BUN: 9 mg/dL (ref 8–23)
CO2: 23 mmol/L (ref 22–32)
Calcium: 9.5 mg/dL (ref 8.9–10.3)
Chloride: 109 mmol/L (ref 98–111)
Creatinine, Ser: 0.7 mg/dL (ref 0.44–1.00)
GFR calc Af Amer: >60 mL/min (ref >60)
GFR calc non Af Amer: >60 mL/min (ref >60)
Glucose, Bld: 86 mg/dL (ref 70–99)
Potassium: 3.9 mmol/L (ref 3.5–5.1)
Sodium: 141 mmol/L (ref 135–145)

## 2020-04-12 LAB — CBC
HCT: 41.2 % (ref 36.0–46.0)
Hemoglobin: 12.7 g/dL (ref 12.0–15.0)
MCH: 30.1 pg (ref 26.0–34.0)
MCHC: 30.8 g/dL (ref 30.0–36.0)
MCV: 97.6 fL (ref 80.0–100.0)
Platelets: 302 10*3/uL (ref 150–400)
RBC: 4.22 MIL/uL (ref 3.87–5.11)
RDW: 12 % (ref 11.5–15.5)
WBC: 7.1 10*3/uL (ref 4.0–10.5)
nRBC: 0 % (ref 0.0–0.2)

## 2020-04-12 LAB — URINALYSIS, ROUTINE W REFLEX MICROSCOPIC
Bilirubin Urine: NEGATIVE
Glucose, UA: NEGATIVE mg/dL
Hgb urine dipstick: NEGATIVE
Ketones, ur: NEGATIVE mg/dL
Leukocytes,Ua: NEGATIVE
Nitrite: NEGATIVE
Protein, ur: NEGATIVE mg/dL
Specific Gravity, Urine: 1.02 (ref 1.005–1.030)
pH: 6 (ref 5.0–8.0)

## 2020-04-12 LAB — SURGICAL PCR SCREEN
MRSA, PCR: NEGATIVE
Staphylococcus aureus: NEGATIVE

## 2020-04-12 NOTE — Progress Notes (Signed)
PCP - Dr. Delbert Harness Cardiologist - Dr. Sharyn Lull  PPM/ICD - Denies  EKG -04/12/20 Stress Test -2019  ECHO - Denies Cardiac Cath - 03/17/17 Follows with Dr. Phylis Bougie  Sleep Study - Denies  Aspirin Instructions: Instructed to hold as of today  ERAS Protcol -Yes PRE-SURGERY Ensure -  Given  COVID TEST- 04/16/20  Anesthesia review: Yes cardiac hx  Patient denies shortness of breath, fever, cough and chest pain at PAT appointment  All instructions explained to the patient, with a verbal understanding of the material. Patient agrees to go over the instructions while at home for a better understanding. Patient also instructed to self quarantine after being tested for COVID-19. The opportunity to ask questions was provided.

## 2020-04-13 ENCOUNTER — Other Ambulatory Visit: Payer: Self-pay

## 2020-04-13 ENCOUNTER — Telehealth: Payer: Self-pay | Admitting: Orthopedic Surgery

## 2020-04-13 LAB — URINE CULTURE: Culture: 20000 — AB

## 2020-04-13 NOTE — Telephone Encounter (Signed)
Pls advise. Thanks.  

## 2020-04-13 NOTE — Telephone Encounter (Signed)
Nancy Vasquez is scheduled for surgery next week with Dr. August Saucer. [name] called from pre-admission to make sure Dr. August Saucer was aware that patient's urine culture was positive for Strep B.  Please advise patient on any instructions.

## 2020-04-13 NOTE — Anesthesia Preprocedure Evaluation (Addendum)
Anesthesia Evaluation  Patient identified by MRN, date of birth, ID band Patient awake    Reviewed: Allergy & Precautions, NPO status , Patient's Chart, lab work & pertinent test results  Airway Mallampati: III  TM Distance: >3 FB Neck ROM: Full    Dental no notable dental hx. (+) Partial Upper, Dental Advisory Given   Pulmonary neg pulmonary ROS,    Pulmonary exam normal breath sounds clear to auscultation       Cardiovascular hypertension, Pt. on medications + angina Normal cardiovascular exam Rhythm:Regular Rate:Normal  Stable angina, last seen by cardiology 04/16/20- cleared for surgery  Nuclear stress 02/15/18: IMPRESSION: 1. Small region of inducible ischemia in the cardiac apex and extending along the anterior wall.  2. Normal left ventricular wall motion.  3. Left ventricular ejection fraction 60%  4. Non invasive risk stratification*: Low  Cath 03/17/17: RPDA-2 lesion, 70 %stenosed. RPDA-1 lesion, 60 %stenosed. Prox Cx lesion, 20 %stenosed. Prox RCA lesion, 20 %stenosed. Mid RCA-2 lesion, 20 %stenosed. Mid RCA-1 lesion, 20 %stenosed. Mid LAD lesion, 40 %stenosed. Dist LAD lesion, 80 %stenosed. The left ventricular systolic function is normal. LV end diastolic pressure is normal. The left ventricular ejection fraction is greater than 65% by visual estimate.   Neuro/Psych negative neurological ROS  negative psych ROS   GI/Hepatic negative GI ROS, Neg liver ROS,   Endo/Other  negative endocrine ROS  Renal/GU negative Renal ROS  negative genitourinary   Musculoskeletal  (+) Arthritis , Osteoarthritis,  Left knee OA   Abdominal (+) + obese,   Peds negative pediatric ROS (+)  Hematology negative hematology ROS (+) hct 41.2, plt 302   Anesthesia Other Findings   Reproductive/Obstetrics negative OB ROS                            Anesthesia Physical Anesthesia Plan  ASA:  III  Anesthesia Plan: Spinal, Regional and MAC   Post-op Pain Management:  Regional for Post-op pain   Induction:   PONV Risk Score and Plan: 2 and Propofol infusion and TIVA  Airway Management Planned: Natural Airway and Nasal Cannula  Additional Equipment: None  Intra-op Plan:   Post-operative Plan:   Informed Consent: I have reviewed the patients History and Physical, chart, labs and discussed the procedure including the risks, benefits and alternatives for the proposed anesthesia with the patient or authorized representative who has indicated his/her understanding and acceptance.       Plan Discussed with: CRNA  Anesthesia Plan Comments:        Anesthesia Quick Evaluation

## 2020-04-13 NOTE — Progress Notes (Signed)
Anesthesia Chart Review:  Follows with Dr. Sharyn Lull for hx of Stable Angina, CAD, HTN, HLD. Last seen 04/06/20, stable per note, she does have a prescription for NTG but has not needed to use it recently. Discussed upcoming TKA. Copy of cardiac clearance letter dated 04/09/20 on pt chart.   Preop labs reviewed, WNL  EKG 04/12/20: NSR. Rate 64.  Nuclear stress 02/15/18: IMPRESSION: 1. Small region of inducible ischemia in the cardiac apex and extending along the anterior wall.  2. Normal left ventricular wall motion.  3. Left ventricular ejection fraction 60%  4. Non invasive risk stratification*: Low  Cath 03/17/17:  RPDA-2 lesion, 70 %stenosed.  RPDA-1 lesion, 60 %stenosed.  Prox Cx lesion, 20 %stenosed.  Prox RCA lesion, 20 %stenosed.  Mid RCA-2 lesion, 20 %stenosed.  Mid RCA-1 lesion, 20 %stenosed.  Mid LAD lesion, 40 %stenosed.  Dist LAD lesion, 80 %stenosed.  The left ventricular systolic function is normal.  LV end diastolic pressure is normal.  The left ventricular ejection fraction is greater than 65% by visual estimate.   Zannie Cove [facility] Short Stay Center/Anesthesiology Phone [phone] 04/13/2020 3:52 PM

## 2020-04-13 NOTE — Progress Notes (Signed)
Nurse spoke with Nancy Vasquez, Surgical Coordinator, to make MD aware of abnormal urine culture.

## 2020-04-15 ENCOUNTER — Other Ambulatory Visit: Payer: Self-pay | Admitting: Surgical

## 2020-04-15 MED ORDER — AMOXICILLIN 500 MG PO TABS: 500.0000 mg | ORAL_TABLET | Freq: Three times a day (TID) | ORAL | 0 refills | Status: DC

## 2020-04-16 ENCOUNTER — Other Ambulatory Visit (HOSPITAL_COMMUNITY)
Admission: RE | Admit: 2020-04-16 | Discharge: 2020-04-16 | Disposition: A | Discharge status: Home / Self Care | Payer: Medicare Other | Source: Ambulatory Visit | Attending: Orthopedic Surgery | Admitting: Orthopedic Surgery

## 2020-04-16 DIAGNOSIS — Z01812 Encounter for preprocedural laboratory examination: Secondary | ICD-10-CM | POA: Diagnosis present

## 2020-04-16 DIAGNOSIS — Z20822 Contact with and (suspected) exposure to covid-19: Secondary | ICD-10-CM | POA: Diagnosis not present

## 2020-04-16 LAB — SARS CORONAVIRUS 2 (TAT 6-24 HRS): SARS Coronavirus 2: NEGATIVE

## 2020-04-19 ENCOUNTER — Other Ambulatory Visit: Payer: Self-pay

## 2020-04-19 ENCOUNTER — Encounter (HOSPITAL_COMMUNITY): Payer: Self-pay | Admitting: Orthopedic Surgery

## 2020-04-19 ENCOUNTER — Ambulatory Visit (HOSPITAL_COMMUNITY): Payer: Medicare Other | Admitting: Anesthesiology

## 2020-04-19 ENCOUNTER — Ambulatory Visit (HOSPITAL_COMMUNITY): Payer: Medicare Other | Admitting: Physician Assistant

## 2020-04-19 ENCOUNTER — Encounter (HOSPITAL_COMMUNITY)
Admission: RE | Disposition: A | Discharge status: Home / Self Care | Payer: Self-pay | Source: Home / Self Care | Attending: Orthopedic Surgery

## 2020-04-19 ENCOUNTER — Observation Stay (HOSPITAL_COMMUNITY)
Admission: RE | Admit: 2020-04-19 | Discharge: 2020-04-21 | Disposition: A | Discharge status: Home / Self Care | Payer: Medicare Other | Attending: Orthopedic Surgery | Admitting: Orthopedic Surgery

## 2020-04-19 DIAGNOSIS — M1990 Unspecified osteoarthritis, unspecified site: Secondary | ICD-10-CM | POA: Diagnosis not present

## 2020-04-19 DIAGNOSIS — I10 Essential (primary) hypertension: Secondary | ICD-10-CM | POA: Insufficient documentation

## 2020-04-19 DIAGNOSIS — M1712 Unilateral primary osteoarthritis, left knee: Principal | ICD-10-CM | POA: Insufficient documentation

## 2020-04-19 DIAGNOSIS — Z20822 Contact with and (suspected) exposure to covid-19: Secondary | ICD-10-CM | POA: Diagnosis not present

## 2020-04-19 DIAGNOSIS — E669 Obesity, unspecified: Secondary | ICD-10-CM | POA: Insufficient documentation

## 2020-04-19 DIAGNOSIS — Z6828 Body mass index (BMI) 28.0-28.9, adult: Secondary | ICD-10-CM | POA: Diagnosis not present

## 2020-04-19 HISTORY — PX: TOTAL KNEE ARTHROPLASTY: SHX125

## 2020-04-19 SURGERY — ARTHROPLASTY, KNEE, TOTAL
Anesthesia: Monitor Anesthesia Care | Site: Knee | Laterality: Left

## 2020-04-19 MED ORDER — IRRISEPT - 450ML BOTTLE WITH 0.05% CHG IN STERILE WATER, USP 99.95% OPTIME
TOPICAL | Status: DC | PRN
  Administered 2020-04-19: 450 mL via TOPICAL

## 2020-04-19 MED ORDER — METHOCARBAMOL 500 MG PO TABS
500.0000 mg | ORAL_TABLET | Freq: Four times a day (QID) | ORAL | Status: DC | PRN
  Administered 2020-04-19 – 2020-04-21 (×3): 500 mg via ORAL
  Filled 2020-04-19 (×3): qty 1

## 2020-04-19 MED ORDER — ONDANSETRON HCL 4 MG/2ML IJ SOLN
INTRAMUSCULAR | Status: DC | PRN
  Administered 2020-04-19: 4 mg via INTRAVENOUS

## 2020-04-19 MED ORDER — BUPIVACAINE LIPOSOME 1.3 % IJ SUSP
20.0000 mL | Freq: Once | INTRAMUSCULAR | Status: DC
  Filled 2020-04-19: qty 20

## 2020-04-19 MED ORDER — SODIUM CHLORIDE 0.9 % IV SOLN
INTRAVENOUS | Status: DC | PRN
  Administered 2020-04-19: 25 ug/min via INTRAVENOUS

## 2020-04-19 MED ORDER — CEFAZOLIN SODIUM-DEXTROSE 2-4 GM/100ML-% IV SOLN
INTRAVENOUS | Status: AC
  Filled 2020-04-19: qty 100

## 2020-04-19 MED ORDER — METOCLOPRAMIDE HCL 5 MG/ML IJ SOLN: 5.0000 mg | Freq: Three times a day (TID) | INTRAMUSCULAR | Status: DC | PRN

## 2020-04-19 MED ORDER — CEFAZOLIN SODIUM-DEXTROSE 2-4 GM/100ML-% IV SOLN
2.0000 g | INTRAVENOUS | Status: AC
  Administered 2020-04-19: 2 g via INTRAVENOUS

## 2020-04-19 MED ORDER — BUPIVACAINE IN DEXTROSE 0.75-8.25 % IT SOLN
INTRATHECAL | Status: DC | PRN
  Administered 2020-04-19: 2 mL via INTRATHECAL

## 2020-04-19 MED ORDER — FENTANYL CITRATE (PF) 100 MCG/2ML IJ SOLN
25.0000 ug | INTRAMUSCULAR | Status: DC | PRN
  Administered 2020-04-19 (×2): 25 ug via INTRAVENOUS

## 2020-04-19 MED ORDER — PROPOFOL 10 MG/ML IV BOLUS
INTRAVENOUS | Status: DC | PRN
  Administered 2020-04-19: 50 mg via INTRAVENOUS

## 2020-04-19 MED ORDER — ACETAMINOPHEN 500 MG PO TABS: 1000.0000 mg | ORAL_TABLET | Freq: Once | ORAL | Status: AC

## 2020-04-19 MED ORDER — METHOCARBAMOL 1000 MG/10ML IJ SOLN
500.0000 mg | Freq: Four times a day (QID) | INTRAVENOUS | Status: DC | PRN
  Filled 2020-04-19: qty 5

## 2020-04-19 MED ORDER — MENTHOL 3 MG MT LOZG: 1.0000 | LOZENGE | OROMUCOSAL | Status: DC | PRN

## 2020-04-19 MED ORDER — POVIDONE-IODINE 10 % EX SWAB
2.0000 "application " | Freq: Once | CUTANEOUS | Status: AC
  Administered 2020-04-19: 2 via TOPICAL

## 2020-04-19 MED ORDER — LACTATED RINGERS IV SOLN: INTRAVENOUS | Status: DC | PRN

## 2020-04-19 MED ORDER — MORPHINE SULFATE (PF) 4 MG/ML IV SOLN
INTRAVENOUS | Status: DC | PRN
  Administered 2020-04-19: 4 mg via INTRAVENOUS

## 2020-04-19 MED ORDER — PHENYLEPHRINE HCL (PRESSORS) 10 MG/ML IV SOLN
INTRAVENOUS | Status: DC | PRN
  Administered 2020-04-19: 80 ug via INTRAVENOUS
  Administered 2020-04-19 (×2): 120 ug via INTRAVENOUS

## 2020-04-19 MED ORDER — TRANEXAMIC ACID-NACL 1000-0.7 MG/100ML-% IV SOLN
INTRAVENOUS | Status: AC
  Filled 2020-04-19: qty 100

## 2020-04-19 MED ORDER — POVIDONE-IODINE 10 % EX SWAB: 2.0000 "application " | Freq: Once | CUTANEOUS | Status: DC

## 2020-04-19 MED ORDER — ROPIVACAINE HCL 5 MG/ML IJ SOLN
INTRAMUSCULAR | Status: DC | PRN
  Administered 2020-04-19: 30 mL via PERINEURAL

## 2020-04-19 MED ORDER — BUPIVACAINE HCL 0.25 % IJ SOLN
INTRAMUSCULAR | Status: DC | PRN
  Administered 2020-04-19: 10 mL
  Administered 2020-04-19: 20 mL

## 2020-04-19 MED ORDER — ASPIRIN 81 MG PO CHEW
81.0000 mg | CHEWABLE_TABLET | Freq: Two times a day (BID) | ORAL | Status: DC
  Administered 2020-04-19 – 2020-04-21 (×4): 81 mg via ORAL
  Filled 2020-04-19 (×4): qty 1

## 2020-04-19 MED ORDER — FENTANYL CITRATE (PF) 100 MCG/2ML IJ SOLN
INTRAMUSCULAR | Status: AC
  Administered 2020-04-19: 100 ug via INTRAVENOUS
  Filled 2020-04-19: qty 2

## 2020-04-19 MED ORDER — OXYCODONE HCL 5 MG PO TABS
5.0000 mg | ORAL_TABLET | Freq: Once | ORAL | Status: AC | PRN
  Administered 2020-04-19: 5 mg via ORAL

## 2020-04-19 MED ORDER — TRANEXAMIC ACID-NACL 1000-0.7 MG/100ML-% IV SOLN
1000.0000 mg | INTRAVENOUS | Status: AC
  Administered 2020-04-19: 1000 mg via INTRAVENOUS

## 2020-04-19 MED ORDER — PROMETHAZINE HCL 25 MG/ML IJ SOLN: 6.2500 mg | INTRAMUSCULAR | Status: DC | PRN

## 2020-04-19 MED ORDER — FENTANYL CITRATE (PF) 100 MCG/2ML IJ SOLN: 100.0000 ug | Freq: Once | INTRAMUSCULAR | Status: AC

## 2020-04-19 MED ORDER — OXYCODONE HCL 5 MG PO TABS
ORAL_TABLET | ORAL | Status: AC
  Filled 2020-04-19: qty 1

## 2020-04-19 MED ORDER — METOCLOPRAMIDE HCL 5 MG PO TABS
5.0000 mg | ORAL_TABLET | Freq: Three times a day (TID) | ORAL | Status: DC | PRN
  Administered 2020-04-19: 10 mg via ORAL
  Filled 2020-04-19: qty 2

## 2020-04-19 MED ORDER — ATORVASTATIN CALCIUM 10 MG PO TABS
20.0000 mg | ORAL_TABLET | Freq: Every day | ORAL | Status: DC
  Administered 2020-04-20 – 2020-04-21 (×2): 20 mg via ORAL
  Filled 2020-04-19 (×2): qty 2

## 2020-04-19 MED ORDER — SUCCINYLCHOLINE CHLORIDE 20 MG/ML IJ SOLN
INTRAMUSCULAR | Status: DC | PRN
  Administered 2020-04-19: 80 mg via INTRAVENOUS

## 2020-04-19 MED ORDER — OXYCODONE HCL 5 MG PO TABS
5.0000 mg | ORAL_TABLET | ORAL | Status: DC | PRN
  Administered 2020-04-19 (×2): 5 mg via ORAL
  Administered 2020-04-20 – 2020-04-21 (×6): 10 mg via ORAL
  Filled 2020-04-19 (×2): qty 2
  Filled 2020-04-19: qty 1
  Filled 2020-04-19 (×2): qty 2
  Filled 2020-04-19: qty 1
  Filled 2020-04-19 (×2): qty 2

## 2020-04-19 MED ORDER — OXYCODONE HCL 5 MG/5ML PO SOLN: 5.0000 mg | Freq: Once | ORAL | Status: AC | PRN

## 2020-04-19 MED ORDER — TRANEXAMIC ACID 1000 MG/10ML IV SOLN
2000.0000 mg | Freq: Once | INTRAVENOUS | Status: DC
  Filled 2020-04-19 (×2): qty 20

## 2020-04-19 MED ORDER — MIDAZOLAM HCL 5 MG/5ML IJ SOLN
INTRAMUSCULAR | Status: DC | PRN
  Administered 2020-04-19: 1 mg via INTRAVENOUS

## 2020-04-19 MED ORDER — FENTANYL CITRATE (PF) 250 MCG/5ML IJ SOLN
INTRAMUSCULAR | Status: DC | PRN
  Administered 2020-04-19: 25 ug via INTRAVENOUS

## 2020-04-19 MED ORDER — EPHEDRINE SULFATE 50 MG/ML IJ SOLN
INTRAMUSCULAR | Status: DC | PRN
  Administered 2020-04-19 (×2): 5 mg via INTRAVENOUS

## 2020-04-19 MED ORDER — CELECOXIB 200 MG PO CAPS
200.0000 mg | ORAL_CAPSULE | Freq: Two times a day (BID) | ORAL | Status: DC
  Administered 2020-04-20 – 2020-04-21 (×3): 200 mg via ORAL
  Filled 2020-04-19 (×5): qty 1

## 2020-04-19 MED ORDER — CEFAZOLIN SODIUM-DEXTROSE 2-4 GM/100ML-% IV SOLN
2.0000 g | Freq: Four times a day (QID) | INTRAVENOUS | Status: AC
  Administered 2020-04-19 (×2): 2 g via INTRAVENOUS
  Filled 2020-04-19 (×2): qty 100

## 2020-04-19 MED ORDER — HYDROMORPHONE HCL 1 MG/ML IJ SOLN
0.5000 mg | INTRAMUSCULAR | Status: DC | PRN
  Administered 2020-04-19 – 2020-04-21 (×5): 0.5 mg via INTRAVENOUS
  Filled 2020-04-19 (×5): qty 1

## 2020-04-19 MED ORDER — BUPIVACAINE LIPOSOME 1.3 % IJ SUSP
INTRAMUSCULAR | Status: DC | PRN
  Administered 2020-04-19: 20 mL

## 2020-04-19 MED ORDER — SODIUM CHLORIDE 0.9 % IR SOLN
Status: DC | PRN
  Administered 2020-04-19: 3000 mL

## 2020-04-19 MED ORDER — BUPIVACAINE HCL (PF) 0.25 % IJ SOLN
INTRAMUSCULAR | Status: AC
  Filled 2020-04-19: qty 30

## 2020-04-19 MED ORDER — CLONIDINE HCL (ANALGESIA) 100 MCG/ML EP SOLN
EPIDURAL | Status: DC | PRN
  Administered 2020-04-19: 1 mL via INTRA_ARTICULAR

## 2020-04-19 MED ORDER — FENTANYL CITRATE (PF) 250 MCG/5ML IJ SOLN
INTRAMUSCULAR | Status: AC
  Filled 2020-04-19: qty 5

## 2020-04-19 MED ORDER — ONDANSETRON HCL 4 MG PO TABS: 4.0000 mg | ORAL_TABLET | Freq: Four times a day (QID) | ORAL | Status: DC | PRN

## 2020-04-19 MED ORDER — DEXAMETHASONE SODIUM PHOSPHATE 10 MG/ML IJ SOLN
INTRAMUSCULAR | Status: DC | PRN
  Administered 2020-04-19: 10 mg

## 2020-04-19 MED ORDER — ACETAMINOPHEN 500 MG PO TABS
1000.0000 mg | ORAL_TABLET | Freq: Four times a day (QID) | ORAL | Status: AC
  Administered 2020-04-19 – 2020-04-20 (×4): 1000 mg via ORAL
  Filled 2020-04-19 (×4): qty 2

## 2020-04-19 MED ORDER — POVIDONE-IODINE 7.5 % EX SOLN
Freq: Once | CUTANEOUS | Status: DC
  Filled 2020-04-19: qty 118

## 2020-04-19 MED ORDER — MIDAZOLAM HCL 2 MG/2ML IJ SOLN
INTRAMUSCULAR | Status: AC
  Filled 2020-04-19: qty 2

## 2020-04-19 MED ORDER — FENTANYL CITRATE (PF) 100 MCG/2ML IJ SOLN
INTRAMUSCULAR | Status: AC
  Filled 2020-04-19: qty 2

## 2020-04-19 MED ORDER — MORPHINE SULFATE (PF) 4 MG/ML IV SOLN
INTRAVENOUS | Status: AC
  Filled 2020-04-19: qty 1

## 2020-04-19 MED ORDER — TRANEXAMIC ACID 1000 MG/10ML IV SOLN
INTRAVENOUS | Status: DC | PRN
  Administered 2020-04-19: 2000 mg via TOPICAL

## 2020-04-19 MED ORDER — ACETAMINOPHEN 500 MG PO TABS
ORAL_TABLET | ORAL | Status: AC
  Administered 2020-04-19: 1000 mg via ORAL
  Filled 2020-04-19: qty 2

## 2020-04-19 MED ORDER — ONDANSETRON HCL 4 MG/2ML IJ SOLN
4.0000 mg | Freq: Four times a day (QID) | INTRAMUSCULAR | Status: DC | PRN
  Administered 2020-04-20: 4 mg via INTRAVENOUS
  Filled 2020-04-19: qty 2

## 2020-04-19 MED ORDER — DOCUSATE SODIUM 100 MG PO CAPS
100.0000 mg | ORAL_CAPSULE | Freq: Two times a day (BID) | ORAL | Status: DC
  Administered 2020-04-19 – 2020-04-21 (×4): 100 mg via ORAL
  Filled 2020-04-19 (×4): qty 1

## 2020-04-19 MED ORDER — PHENOL 1.4 % MT LIQD: 1.0000 | OROMUCOSAL | Status: DC | PRN

## 2020-04-19 MED ORDER — LACTATED RINGERS IV SOLN: INTRAVENOUS | Status: DC

## 2020-04-19 MED ORDER — AMLODIPINE BESYLATE 2.5 MG PO TABS
2.5000 mg | ORAL_TABLET | Freq: Every day | ORAL | Status: DC
  Administered 2020-04-20 – 2020-04-21 (×2): 2.5 mg via ORAL
  Filled 2020-04-19 (×2): qty 1

## 2020-04-19 MED ORDER — 0.9 % SODIUM CHLORIDE (POUR BTL) OPTIME
TOPICAL | Status: DC | PRN
  Administered 2020-04-19 (×3): 1000 mL

## 2020-04-19 SURGICAL SUPPLY — 79 items
BAG DECANTER FOR FLEXI CONT (MISCELLANEOUS) ×2 IMPLANT
BANDAGE ESMARK 6X9 LF (GAUZE/BANDAGES/DRESSINGS) ×1 IMPLANT
BLADE SAG 18X100X1.27 (BLADE) ×2 IMPLANT
BNDG CMPR 9X6 STRL LF SNTH (GAUZE/BANDAGES/DRESSINGS) ×1
BNDG CMPR MED 15X6 ELC VLCR LF (GAUZE/BANDAGES/DRESSINGS) ×1
BNDG COHESIVE 6X5 TAN STRL LF (GAUZE/BANDAGES/DRESSINGS) ×2 IMPLANT
BNDG ELASTIC 6X15 VLCR STRL LF (GAUZE/BANDAGES/DRESSINGS) ×2 IMPLANT
BNDG ESMARK 6X9 LF (GAUZE/BANDAGES/DRESSINGS) ×2
BOWL SMART MIX CTS (DISPOSABLE) IMPLANT
BSPLAT TIB 4 KN TRITANIUM (Knees) ×1 IMPLANT
CNTNR URN SCR LID CUP LEK RST (MISCELLANEOUS) ×1 IMPLANT
COMP FEMORAL TRIATHLON SZ3 (Joint) ×2 IMPLANT
COMPONENT FEMRL TRIATHLON SZ3 (Joint) IMPLANT
CONT SPEC 4OZ STRL OR WHT (MISCELLANEOUS) ×2
COVER SURGICAL LIGHT HANDLE (MISCELLANEOUS) ×2 IMPLANT
COVER WAND RF STERILE (DRAPES) ×1 IMPLANT
CUFF TOURN SGL QUICK 34 (TOURNIQUET CUFF) ×2
CUFF TRNQT CYL 34X4.125X (TOURNIQUET CUFF) ×1 IMPLANT
DECANTER SPIKE VIAL GLASS SM (MISCELLANEOUS) ×2 IMPLANT
DRAPE INCISE IOBAN 66X45 STRL (DRAPES) IMPLANT
DRAPE ORTHO SPLIT 77X108 STRL (DRAPES) ×6
DRAPE SURG ORHT 6 SPLT 77X108 (DRAPES) ×3 IMPLANT
DRAPE U-SHAPE 47X51 STRL (DRAPES) ×2 IMPLANT
DRSG AQUACEL AG ADV 3.5X14 (GAUZE/BANDAGES/DRESSINGS) ×1 IMPLANT
DURAPREP 26ML APPLICATOR (WOUND CARE) ×5 IMPLANT
ELECT CAUTERY BLADE 6.4 (BLADE) ×2 IMPLANT
ELECT REM PT RETURN 9FT ADLT (ELECTROSURGICAL) ×2
ELECTRODE REM PT RTRN 9FT ADLT (ELECTROSURGICAL) ×1 IMPLANT
GAUZE SPONGE 4X4 12PLY STRL (GAUZE/BANDAGES/DRESSINGS) ×2 IMPLANT
GLOVE BIOGEL PI IND STRL 7.0 (GLOVE) ×1 IMPLANT
GLOVE BIOGEL PI IND STRL 8 (GLOVE) ×1 IMPLANT
GLOVE BIOGEL PI INDICATOR 7.0 (GLOVE) ×1
GLOVE BIOGEL PI INDICATOR 8 (GLOVE) ×1
GLOVE ECLIPSE 7.0 STRL STRAW (GLOVE) ×2 IMPLANT
GLOVE ECLIPSE 8.0 STRL XLNG CF (GLOVE) ×2 IMPLANT
GOWN STRL REUS W/ TWL LRG LVL3 (GOWN DISPOSABLE) ×3 IMPLANT
GOWN STRL REUS W/TWL LRG LVL3 (GOWN DISPOSABLE) ×6
HANDPIECE INTERPULSE COAX TIP (DISPOSABLE) ×2
HOOD PEEL AWAY FLYTE STAYCOOL (MISCELLANEOUS) ×7 IMPLANT
IMMOBILIZER KNEE 20 (SOFTGOODS)
IMMOBILIZER KNEE 20 THIGH 36 (SOFTGOODS) IMPLANT
IMMOBILIZER KNEE 22 UNIV (SOFTGOODS) ×1 IMPLANT
IMMOBILIZER KNEE 24 THIGH 36 (MISCELLANEOUS) IMPLANT
IMMOBILIZER KNEE 24 UNIV (MISCELLANEOUS)
INSERT CS TRIATH SZ4 13 (Insert) ×1 IMPLANT
KIT BASIN OR (CUSTOM PROCEDURE TRAY) ×2 IMPLANT
KIT TURNOVER KIT B (KITS) ×2 IMPLANT
KNEE PATELLA ASYMMETRIC 10X32 (Knees) ×1 IMPLANT
KNEE TIBIAL COMP TRI SZ4 (Knees) ×1 IMPLANT
MANIFOLD NEPTUNE II (INSTRUMENTS) ×2 IMPLANT
NDL SPNL 18GX3.5 QUINCKE PK (NEEDLE) ×1 IMPLANT
NEEDLE 22X1 1/2 (OR ONLY) (NEEDLE) ×4 IMPLANT
NEEDLE SPNL 18GX3.5 QUINCKE PK (NEEDLE) ×2 IMPLANT
NS IRRIG 1000ML POUR BTL (IV SOLUTION) ×4 IMPLANT
PACK TOTAL JOINT (CUSTOM PROCEDURE TRAY) ×2 IMPLANT
PAD ARMBOARD 7.5X6 YLW CONV (MISCELLANEOUS) ×4 IMPLANT
PAD CAST 4YDX4 CTTN HI CHSV (CAST SUPPLIES) ×1 IMPLANT
PADDING CAST ABS 6INX4YD NS (CAST SUPPLIES) ×1
PADDING CAST ABS COTTON 6X4 NS (CAST SUPPLIES) IMPLANT
PADDING CAST COTTON 4X4 STRL (CAST SUPPLIES) ×2
PADDING CAST COTTON 6X4 STRL (CAST SUPPLIES) ×2 IMPLANT
PIN FLUTED HEDLESS FIX 3.5X1/8 (PIN) ×1 IMPLANT
SET HNDPC FAN SPRY TIP SCT (DISPOSABLE) ×1 IMPLANT
STRIP CLOSURE SKIN 1/2X4 (GAUZE/BANDAGES/DRESSINGS) ×4 IMPLANT
SUCTION FRAZIER HANDLE 10FR (MISCELLANEOUS) ×2
SUCTION TUBE FRAZIER 10FR DISP (MISCELLANEOUS) ×1 IMPLANT
SUT MNCRL AB 3-0 PS2 18 (SUTURE) ×2 IMPLANT
SUT VIC AB 0 CT1 27 (SUTURE) ×6
SUT VIC AB 0 CT1 27XBRD ANBCTR (SUTURE) ×3 IMPLANT
SUT VIC AB 1 CT1 27 (SUTURE) ×10
SUT VIC AB 1 CT1 27XBRD ANBCTR (SUTURE) ×5 IMPLANT
SUT VIC AB 2-0 CT1 27 (SUTURE) ×8
SUT VIC AB 2-0 CT1 TAPERPNT 27 (SUTURE) ×4 IMPLANT
SYR 30ML LL (SYRINGE) ×6 IMPLANT
SYR TB 1ML LUER SLIP (SYRINGE) ×2 IMPLANT
TOWEL GREEN STERILE (TOWEL DISPOSABLE) ×4 IMPLANT
TOWEL GREEN STERILE FF (TOWEL DISPOSABLE) ×4 IMPLANT
TRAY CATH 16FR W/PLASTIC CATH (SET/KITS/TRAYS/PACK) ×1 IMPLANT
WATER STERILE IRR 1000ML POUR (IV SOLUTION) IMPLANT

## 2020-04-19 NOTE — Op Note (Signed)
NAME: Nancy Vasquez, Nancy Vasquez MEDICAL RECORD VP:[id number] ACCOUNT [id number] DATE OF BIRTH:1941/04/16 FACILITY: [facility] LOCATION: [facility] PHYSICIAN:Tawnee Clegg Diamantina Providence, MD  OPERATIVE REPORT  DATE OF PROCEDURE:  04/19/2020  PREOPERATIVE DIAGNOSIS:  Left knee arthritis.  POSTOPERATIVE DIAGNOSIS:  Left knee arthritis.  PROCEDURE:  Left total knee replacement using Stryker press-fit 3 femur, 4 tibia, 13 mm polyethylene deep dish insert posterior cruciate ligament retaining with 32 mm 3-peg press-fit patella.  SURGEON:  Cammy Copa, MD  ASSISTANT:  Karenann Cai, PA.  INDICATIONS:  The patient is a 79 year old patient with end-stage left knee arthritis, who presents for operative management after explanation of risks and benefits.  PROCEDURE IN DETAIL:  The patient was brought to the operating room where general anesthetic was induced.  Preoperative antibiotics administered.  Timeout was called.  Left leg was then prescrubbed with alcohol and Betadine, allowed to air dry, prepped  with DuraPrep solution and draped in a sterile manner.  Ioban used to cover the operative field.  The leg was then elevated, exsanguinated with the Esmarch wrap.  Tourniquet was inflated for a total tourniquet time of 76 minutes.  Anterior approach to  knee was made.  Skin and subcutaneous tissue were sharply divided.  Median parapatellar approach was made and marked with a #1 Vicryl suture.  Patella was everted.  The fat pad was partially resected.  The lateral patellofemoral ligament was released.   Anterior tissue removed from the anterior distal femur.  Minimal soft tissue dissection was performed medially.  Next, the intramedullary alignment was used to cut the tibia perpendicular to the mechanical axis.  The cut was made 9 mm off the least  affected lateral tibial plateau.  Appropriate sized cut was made.  Next, the femur was cut in 5 degrees valgus with an 8 mm distal femoral cut, sized to a size 3.  Anterior,  posterior chamfer cuts were made.  The PCL was retained.  Next, the gaps were  measured and the patient had symmetric flexion and extension gaps at approximately 12 or 13 mm.  The tibia was keel punched.  The patella was cut freehand down from 24 to 14 and a 3-peg trial patellar button was placed.  Bone quality was excellent in  both the tibia, femur and patella.  Trial components placed.  The patient had full extension and excellent flexion with a 13 mm spacer.  Patella tracked beautifully with no thumbs technique.  No liftoff, except at 135 degrees of flexion.  Next, the trial  implants were removed, thorough irrigation performed with 3 L of irrigating solution of Marcaine, morphine, clonidine used to numb up the knee capsule.  Tranexamic acid sponge allowed to sit for 3 minutes and then IrriSept solution was used after the  incision, after the arthrotomy and at multiple times during the case.  Also used to sit for 3 minutes  during this phase where the tranexamic acid sponge was sitting. Next, sponge was removed.  Components were then tapped into position with excellent  press-fit obtained.  Same stability parameters maintained.  Tourniquet released.  Bleeding points encountered controlled using electrocautery.  Thorough irrigation was then performed using pouring irrigation.  The incision was then closed over a bolster  using #1 Vicryls, followed by interrupted inverted 0 Vicryl suture, 2-0 Vicryl suture, 3-0 Vicryl suture and a 3-0 Monocryl.  Steri-Strips and Aquacel dressing placed with the knee in flexion.  The patient tolerated the procedure well without immediate  complication.  [name]'s assistance was required at  all times during the case for retraction, opening and closing.  His assistance was a medical necessity.  VN/NUANCE  D:04/19/2020 T:04/19/2020 JOB:[id number]

## 2020-04-19 NOTE — H&P (Signed)
TOTAL KNEE ADMISSION H&P  Patient is being admitted for left total knee arthroplasty.  Subjective:  Chief Complaint:left knee pain.  HPI: Nancy Vasquez, 79 y.o. female, has a history of pain and functional disability in the left knee due to arthritis and has failed non-surgical conservative treatments for greater than 12 weeks to includeNSAID's and/or analgesics, flexibility and strengthening excercises and activity modification.  Onset of symptoms was gradual, starting 7 years ago with gradually worsening course since that time. The patient noted no past surgery on the left knee(s).  Patient currently rates pain in the left knee(s) at 8 out of 10 with activity. Patient has night pain, worsening of pain with activity and weight bearing, pain that interferes with activities of daily living, pain with passive range of motion, crepitus and joint swelling.  Patient has evidence of subchondral sclerosis and joint space narrowing by imaging studies. This patient has had Left hip pain as well but would prefer to get the knee replaced prior to hip replacement.. There is no active infection.  There are no problems to display for this patient.  Past Medical History:  Diagnosis Date  . Arthritis    has had right hip replacement    Past Surgical History:  Procedure Laterality Date  . ABDOMINAL HYSTERECTOMY    . BREAST EXCISIONAL BIOPSY Left   . INTRAVASCULAR PRESSURE WIRE/FFR STUDY N/A 03/17/2017   Procedure: Intravascular Pressure Wire/FFR Study;  Surgeon: Rinaldo Cloud, MD;  Location: [facility] INVASIVE CV LAB;  Service: Cardiovascular;  Laterality: N/A;  mid LAD  . LEFT HEART CATH AND CORONARY ANGIOGRAPHY N/A 03/17/2017   Procedure: Left Heart Cath and Coronary Angiography;  Surgeon: Rinaldo Cloud, MD;  Location: [facility] INVASIVE CV LAB;  Service: Cardiovascular;  Laterality: N/A;  . TONSILLECTOMY     Removed as a child    Current Facility-Administered Medications  Medication Dose Route Frequency Provider  Last Rate Last Admin  . ceFAZolin (ANCEF) 2-4 GM/100ML-% IVPB           . ceFAZolin (ANCEF) IVPB 2g/100 mL premix  2 g Intravenous On Call to OR Magnant, Joycie Peek, PA-C      . lactated ringers infusion   Intravenous Continuous Lannie Fields, DO 10 mL/hr at 04/19/20 1022 New Bag at 04/19/20 1022  . povidone-iodine (BETADINE) 7.5 % scrub   Topical Once Magnant, Charles L, PA-C      . povidone-iodine 10 % swab 2 application  2 application Topical Once Magnant, Charles L, PA-C      . tranexamic acid (CYKLOKAPRON) 1000MG /124mL IVPB           . tranexamic acid (CYKLOKAPRON) IVPB 1,000 mg  1,000 mg Intravenous To OR Magnant, Charles L, PA-C       Allergies  Allergen Reactions  . Ibuprofen Nausea And Vomiting and Other (See Comments)    "burns stomach"    Social History   Tobacco Use  . Smoking status: Never Smoker  . Smokeless tobacco: Never Used  Substance Use Topics  . Alcohol use: Never    Family History  Problem Relation Age of Onset  . Breast cancer Neg Hx      Review of Systems  Musculoskeletal: Positive for arthralgias.  All other systems reviewed and are negative.   Objective:  Physical Exam  Constitutional: She appears well-developed.  HENT:  Head: Normocephalic.  Eyes: Pupils are equal, round, and reactive to light.  Cardiovascular: Normal rate.  Respiratory: Effort normal.  Musculoskeletal:     Cervical  back: Normal range of motion.  Neurological: She is alert.  Skin: Skin is warm.  Psychiatric: She has a normal mood and affect.   Examination of the left knee demonstrates intact extensor mechanism.  Skin is intact.  Range of motion 5-95.  Slight varus alignment.  Pedal pulses palpable.  Mild groin pain with internal/external rotation of the left hip. Vital signs in last 24 hours: Temp:  [99.1 F (37.3 C)] 99.1 F (37.3 C) (04/22 1006) Pulse Rate:  [58-63] 60 (04/22 1050) Resp:  [6-19] 11 (04/22 1050) BP: (110-128)/(47-62) 128/59 (04/22 1050) SpO2:   [99 %-100 %] 100 % (04/22 1050) Weight:  [79.4 kg] 79.4 kg (04/22 1006)  Labs:   Estimated body mass index is 28.25 kg/m as calculated from the following:   Height as of this encounter: 5\' 6"  (1.676 m).   Weight as of this encounter: 79.4 kg.   Imaging Review Plain radiographs demonstrate severe degenerative joint disease of the left knee(s). The overall alignment ismild varus. The bone quality appears to be good for age and reported activity level.      Assessment/Plan:  End stage arthritis, left knee   The patient history, physical examination, clinical judgment of the provider and imaging studies are consistent with end stage degenerative joint disease of the left knee(s) and total knee arthroplasty is deemed medically necessary. The treatment options including medical management, injection therapy arthroscopy and arthroplasty were discussed at length. The risks and benefits of total knee arthroplasty were presented and reviewed. The risks due to aseptic loosening, infection, stiffness, patella tracking problems, thromboembolic complications and other imponderables were discussed. The patient acknowledged the explanation, agreed to proceed with the plan and consent was signed. Patient is being admitted for inpatient treatment for surgery, pain control, PT, OT, prophylactic antibiotics, VTE prophylaxis, progressive ambulation and ADL's and discharge planning. The patient is planning to be discharged home with home health services     Patient's anticipated LOS is less than 2 midnights, meeting these requirements: - Younger than 91 - Lives within 1 hour of care - Has a competent adult at home to recover with post-op recover - NO history of  - Chronic pain requiring opiods  - Diabetes  - Coronary Artery Disease  - Heart failure  - Heart attack  - Stroke  - DVT/VTE  - Cardiac arrhythmia  - Respiratory Failure/COPD  - Renal failure  - Anemia  - Advanced Liver  disease

## 2020-04-19 NOTE — Anesthesia Postprocedure Evaluation (Signed)
Anesthesia Post Note  Patient: Nancy Vasquez  Procedure(s) Performed: LEFT TOTAL KNEE ARTHROPLASTY (Left Knee)     Patient location during evaluation: PACU Anesthesia Type: Regional, General and Spinal Level of consciousness: awake and alert, oriented and patient cooperative Pain management: pain level controlled Vital Signs Assessment: post-procedure vital signs reviewed and stable Respiratory status: spontaneous breathing, nonlabored ventilation and respiratory function stable Cardiovascular status: blood pressure returned to baseline and stable Postop Assessment: no apparent nausea or vomiting Anesthetic complications: no Comments: Successful spinal but pt vomited when laying flat- intubated for airway protection    Last Vitals:  Vitals:   04/19/20 1630 04/19/20 1653  BP: 91/74 112/64  Pulse: 67 71  Resp: 13 14  Temp: 36.6 C 36.7 C  SpO2: 98% 95%    Last Pain:  Vitals:   04/19/20 1653  PainSc: 4     LLE Motor Response: Purposeful movement;Responds to commands (04/19/20 1653) LLE Sensation: Decreased;Numbness (04/19/20 1653) RLE Motor Response: Purposeful movement;Responds to commands (04/19/20 1653) RLE Sensation: Decreased;Numbness (04/19/20 1653) L Sensory Level: L5-Outer lower leg, top of foot, great toe (04/19/20 1653) R Sensory Level: L5-Outer lower leg, top of foot, great toe (04/19/20 1653)  Lannie Fields

## 2020-04-19 NOTE — Progress Notes (Signed)
Orthopedic Tech Progress Note Patient Details:  Nancy Vasquez 05-25-1941 [id number]  CPM Left Knee CPM Left Knee: On Left Knee Flexion (Degrees): 10 Left Knee Extension (Degrees): 40 Additional Comments: added ice  Post Interventions Patient Tolerated: Well Instructions Provided: Care of device Ortho Devices Type of Ortho Device: Bone foam zero knee Ortho Device/Splint Interventions: Application, Ordered   Post Interventions Patient Tolerated: Well Instructions Provided: Care of device   Donald Pore 04/19/2020, 4:44 PM

## 2020-04-19 NOTE — Transfer of Care (Signed)
Immediate Anesthesia Transfer of Care Note  Patient: Nancy Vasquez  Procedure(s) Performed: LEFT TOTAL KNEE ARTHROPLASTY (Left Knee)  Patient Location: PACU  Anesthesia Type:GA combined with regional for post-op pain  Level of Consciousness: awake  Airway & Oxygen Therapy: Patient Spontanous Breathing and Patient connected to face mask oxygen  Post-op Assessment: Report given to RN and Post -op Vital signs reviewed and stable  Post vital signs: Reviewed and stable  Last Vitals:  Vitals Value Taken Time  BP    Temp    Pulse    Resp    SpO2      Last Pain:  Vitals:   04/19/20 1012  PainSc: 0-No pain         Complications: No apparent anesthesia complications

## 2020-04-19 NOTE — Evaluation (Signed)
Physical Therapy Evaluation Patient Details Name: Nancy Vasquez MRN: [id number] DOB: July 30, 1941 Today's Date: 04/19/2020   History of Present Illness  Pt is a 79 y/o female s/p L TKA. PMH includes arthritis.   Clinical Impression  Pt is s/p surgery above with deficits below. Mobility limited to chair this session secondary to pain and dizziness. Required min A for transfers using RW. Educated about knee precautions. Will continue to follow acutely to maximize functional mobility independence and safety.     Follow Up Recommendations Follow surgeon's recommendation for DC plan and follow-up therapies;Supervision for mobility/OOB    Equipment Recommendations  None recommended by PT    Recommendations for Other Services OT consult     Precautions / Restrictions Precautions Precautions: Knee Precaution Booklet Issued: No Precaution Comments: Verbally reviewed knee precautions.  Restrictions Weight Bearing Restrictions: Yes RLE Weight Bearing: Weight bearing as tolerated      Mobility  Bed Mobility Overal bed mobility: Needs Assistance Bed Mobility: Sit to Supine       Sit to supine: Min assist   General bed mobility comments: Min A for trunk assist. Increased time to come to EOB   Transfers Overall transfer level: Needs assistance Equipment used: Rolling walker (2 wheeled) Transfers: Sit to/from [facility] Sit to Stand: Min assist Stand pivot transfers: Min assist       General transfer comment: Min A for lift assist and steadying to stand and transfer to chair. Cues for sequencing using RW. Pt with mild dizziness and increased pain, so further mobility deferred.   Ambulation/Gait                Stairs            Wheelchair Mobility    Modified Rankin (Stroke Patients Only)       Balance Overall balance assessment: Needs assistance Sitting-balance support: No upper extremity supported;Feet supported Sitting balance-Leahy  Scale: Fair     Standing balance support: Bilateral upper extremity supported;During functional activity Standing balance-Leahy Scale: Poor Standing balance comment: Reliant on BUE support                              Pertinent Vitals/Pain Pain Assessment: 0-10 Pain Score: 6  Pain Location: L knee Pain Descriptors / Indicators: Aching;Operative site guarding Pain Intervention(s): Limited activity within patient's tolerance;Monitored during session;Repositioned    Home Living Family/patient expects to be discharged to:: Private residence Living Arrangements: Spouse/significant other Available Help at Discharge: Family Type of Home: House Home Access: Stairs to enter Entrance Stairs-Rails: None Entrance Stairs-Number of Steps: 1 Home Layout: Multi-level Home Equipment: Environmental consultant - 2 wheels;Bedside commode;Cane - single point      Prior Function Level of Independence: Independent with assistive device(s)         Comments: Used cane for ambulation      Hand Dominance        Extremity/Trunk Assessment   Upper Extremity Assessment Upper Extremity Assessment: Defer to OT evaluation    Lower Extremity Assessment Lower Extremity Assessment: LLE deficits/detail LLE Deficits / Details: Deficits consistent with post op pain and weakness.     Cervical / Trunk Assessment Cervical / Trunk Assessment: Normal  Communication   Communication: No difficulties  Cognition Arousal/Alertness: Awake/alert Behavior During Therapy: WFL for tasks assessed/performed Overall Cognitive Status: Within Functional Limits for tasks assessed  General Comments      Exercises Total Joint Exercises Ankle Circles/Pumps: AROM;Both;10 reps;Supine   Assessment/Plan    PT Assessment Patient needs continued PT services  PT Problem List Decreased strength;Decreased range of motion;Decreased activity tolerance;Decreased  mobility;Decreased balance;Decreased knowledge of use of DME;Decreased knowledge of precautions;Pain       PT Treatment Interventions DME instruction;Gait training;Stair training;Functional mobility training;Therapeutic activities;Balance training;Therapeutic exercise;Patient/family education    PT Goals (Current goals can be found in the Care Plan section)  Acute Rehab PT Goals Patient Stated Goal: to go home PT Goal Formulation: With patient Time For Goal Achievement: 05/03/20 Potential to Achieve Goals: Good    Frequency 7X/week   Barriers to discharge        Co-evaluation               AM-PAC PT "6 Clicks" Mobility  Outcome Measure Help needed turning from your back to your side while in a flat bed without using bedrails?: A Little Help needed moving from lying on your back to sitting on the side of a flat bed without using bedrails?: A Little Help needed moving to and from a bed to a chair (including a wheelchair)?: A Little Help needed standing up from a chair using your arms (e.g., wheelchair or bedside chair)?: A Little Help needed to walk in hospital room?: A Little Help needed climbing 3-5 steps with a railing? : A Lot 6 Click Score: 17    End of Session Equipment Utilized During Treatment: Gait belt Activity Tolerance: Patient limited by pain Patient left: in chair;with call bell/phone within reach;with family/visitor present;with nursing/sitter in room Nurse Communication: Mobility status PT Visit Diagnosis: Muscle weakness (generalized) (M62.81);Difficulty in walking, not elsewhere classified (R26.2);Pain Pain - Right/Left: Left Pain - part of body: Knee    Time: 1610-9604 PT Time Calculation (min) (ACUTE ONLY): 21 min   Charges:   PT Evaluation $PT Eval Low Complexity: 1 Low          Cindee Salt, DPT  Acute Rehabilitation Services  Pager: [phone] Office: [phone]   Lehman Prom 04/19/2020, 6:24 PM

## 2020-04-19 NOTE — Progress Notes (Signed)
Pt/family making numerous requests to be placed on cpm informed will call ortho tech as soon as she is finished getting cleaned up- ortho tech came rather quickly once he was called- she tolerated about 30 minutes- and that was with being medicated- she was reporting extreme pain- ice pack applied and medicated for pain- obtained permission to change dressing if soiled with urine- but she declined at this time due to pain.

## 2020-04-19 NOTE — Anesthesia Procedure Notes (Signed)
Procedure Name: Intubation Date/Time: 04/19/2020 12:34 PM Performed by: Glynda Jaeger, CRNA Pre-anesthesia Checklist: Patient identified, Patient being monitored, Timeout performed, Emergency Drugs available and Suction available Patient Re-evaluated:Patient Re-evaluated prior to induction Oxygen Delivery Method: Circle System Utilized Preoxygenation: Pre-oxygenation with 100% oxygen Induction Type: IV induction Ventilation: Mask ventilation without difficulty Laryngoscope Size: Mac and 4 Grade View: Grade I Tube type: Oral Tube size: 7.5 mm Number of attempts: 1 Airway Equipment and Method: Stylet Placement Confirmation: ETT inserted through vocal cords under direct vision,  positive ETCO2 and breath sounds checked- equal and bilateral Secured at: 22 cm Tube secured with: Tape Dental Injury: Teeth and Oropharynx as per pre-operative assessment

## 2020-04-19 NOTE — Brief Op Note (Signed)
   04/19/2020  3:29 PM  PATIENT:  Nancy Vasquez  79 y.o. female  PRE-OPERATIVE DIAGNOSIS:  left knee osteoarthritis  POST-OPERATIVE DIAGNOSIS:  left knee osteoarthritis  PROCEDURE:  Procedure(s): LEFT TOTAL KNEE ARTHROPLASTY  SURGEON:  Surgeon(s): Cammy Copa, MD  ASSISTANT: magnant pa  ANESTHESIA:   general  EBL: 100 ml    Total I/O In: 1700 [I.V.:1500; IV Piggyback:200] Out: 100 [Blood:100]  BLOOD ADMINISTERED: none  DRAINS: none   LOCAL MEDICATIONS USED:    SPECIMEN:  No Specimen  COUNTS:  YES  TOURNIQUET:  * Missing tourniquet times found for documented tourniquets in log: [id number] *  DICTATION: .Other Dictation: Dictation Number [id number]  PLAN OF CARE: Admit to inpatient   PATIENT DISPOSITION:  PACU - hemodynamically stable

## 2020-04-19 NOTE — Anesthesia Procedure Notes (Signed)
Anesthesia Regional Block: Adductor canal block   Pre-Anesthetic Checklist: ,, timeout performed, Correct Patient, Correct Site, Correct Laterality, Correct Procedure, Correct Position, site marked, Risks and benefits discussed,  Surgical consent,  Pre-op evaluation,  At surgeon's request and post-op pain management  Laterality: Left  Prep: Maximum Sterile Barrier Precautions used, chloraprep       Needles:  Injection technique: Single-shot  Needle Type: Echogenic Stimulator Needle     Needle Length: 9cm  Needle Gauge: 22     Additional Needles:   Procedures:,,,, ultrasound used (permanent image in chart),,,,  Narrative:  Start time: 04/19/2020 10:30 AM End time: 04/19/2020 10:38 AM Injection made incrementally with aspirations every 5 mL.  Performed by: Personally  Anesthesiologist: Lannie Fields, DO  Additional Notes: Monitors applied. No increased pain on injection. No increased resistance to injection. Injection made in 5cc increments. Good needle visualization. Patient tolerated procedure well.

## 2020-04-20 DIAGNOSIS — M1712 Unilateral primary osteoarthritis, left knee: Secondary | ICD-10-CM | POA: Diagnosis not present

## 2020-04-20 MED ORDER — CALCIUM CARBONATE ANTACID 500 MG PO CHEW
1.0000 | CHEWABLE_TABLET | Freq: Every day | ORAL | Status: DC | PRN
  Filled 2020-04-20: qty 1

## 2020-04-20 MED ORDER — DIPHENHYDRAMINE HCL 25 MG PO CAPS
25.0000 mg | ORAL_CAPSULE | Freq: Four times a day (QID) | ORAL | Status: DC | PRN
  Administered 2020-04-20: 25 mg via ORAL
  Filled 2020-04-20: qty 1

## 2020-04-20 MED ORDER — PANTOPRAZOLE SODIUM 40 MG PO TBEC
40.0000 mg | DELAYED_RELEASE_TABLET | Freq: Two times a day (BID) | ORAL | Status: DC
  Administered 2020-04-20 – 2020-04-21 (×2): 40 mg via ORAL
  Filled 2020-04-20 (×2): qty 1

## 2020-04-20 NOTE — Progress Notes (Signed)
Physical Therapy Treatment Patient Details Name: Nancy Vasquez MRN: [id number] DOB: [birth year] Today's Date: 04/20/2020    History of Present Illness Pt is a 79 y/o female s/p L TKA. PMH includes arthritis.     PT Comments    Pt more limited by pain and lethargy this afternoon session (premedicated). Able to participate in bed level exercises; ambulating 12 feet with a walker with significant time and effort. Reporting continued dizziness, however BP stable, 123/63. Encouraged increased food intake. Will continue to progress mobility as tolerated.     Follow Up Recommendations  Follow surgeon's recommendation for DC plan and follow-up therapies;Supervision for mobility/OOB     Equipment Recommendations  None recommended by PT    Recommendations for Other Services       Precautions / Restrictions Precautions Precautions: Knee Precaution Booklet Issued: No Precaution Comments: Verbally reviewed knee precautions.  Restrictions Weight Bearing Restrictions: Yes RLE Weight Bearing: Weight bearing as tolerated    Mobility  Bed Mobility Overal bed mobility: Needs Assistance Bed Mobility: Sit to Supine;Supine to Sit     Supine to sit: Min assist Sit to supine: Min assist   General bed mobility comments: MinA for LLE negotiation. Increased time/effort, use of rail  Transfers Overall transfer level: Needs assistance Equipment used: Rolling walker (2 wheeled) Transfers: Sit to/from [facility] Sit to Stand: Min assist         General transfer comment: MinA to rise from edge of bed  Ambulation/Gait Ambulation/Gait assistance: Min guard Gait Distance (Feet): 12 Feet Assistive device: Rolling walker (2 wheeled) Gait Pattern/deviations: Step-to pattern;Decreased stance time - left;Decreased weight shift to right;Antalgic Gait velocity: decreased Gait velocity interpretation: <1.31 ft/sec, indicative of household ambulator General Gait Details: Min  guard for safety. Cues for sequencing, proximity to walker, use of arms. Very slow pace.   Stairs             Wheelchair Mobility    Modified Rankin (Stroke Patients Only)       Balance Overall balance assessment: Needs assistance Sitting-balance support: No upper extremity supported;Feet supported Sitting balance-Leahy Scale: Fair     Standing balance support: Bilateral upper extremity supported;During functional activity Standing balance-Leahy Scale: Poor Standing balance comment: Reliant on BUE support                             Cognition Arousal/Alertness: Awake/alert Behavior During Therapy: WFL for tasks assessed/performed Overall Cognitive Status: Within Functional Limits for tasks assessed                                        Exercises Total Joint Exercises Quad Sets: Left;10 reps;Supine Heel Slides: AAROM;Left;Supine;15 reps;Seated Hip ABduction/ADduction: AROM;Left;10 reps;Supine Long Arc Quad: Left;Seated;5 reps;AAROM    General Comments        Pertinent Vitals/Pain Pain Assessment: Faces Faces Pain Scale: Hurts even more Pain Location: L knee Pain Descriptors / Indicators: Aching;Operative site guarding;Grimacing Pain Intervention(s): Limited activity within patient's tolerance;Monitored during session;Premedicated before session;Repositioned    Home Living                      Prior Function            PT Goals (current goals can now be found in the care plan section) Acute Rehab PT Goals Patient Stated Goal: to go home  PT Goal Formulation: With patient Time For Goal Achievement: 05/03/20 Potential to Achieve Goals: Good    Frequency    7X/week      PT Plan Current plan remains appropriate    Co-evaluation              AM-PAC PT "6 Clicks" Mobility   Outcome Measure  Help needed turning from your back to your side while in a flat bed without using bedrails?: A Little Help  needed moving from lying on your back to sitting on the side of a flat bed without using bedrails?: A Little Help needed moving to and from a bed to a chair (including a wheelchair)?: A Little Help needed standing up from a chair using your arms (e.g., wheelchair or bedside chair)?: A Little Help needed to walk in hospital room?: A Little Help needed climbing 3-5 steps with a railing? : A Lot 6 Click Score: 17    End of Session Equipment Utilized During Treatment: Gait belt Activity Tolerance: Patient limited by pain Patient left: with call bell/phone within reach;in bed;with family/visitor present Nurse Communication: Mobility status PT Visit Diagnosis: Muscle weakness (generalized) (M62.81);Difficulty in walking, not elsewhere classified (R26.2);Pain Pain - Right/Left: Left Pain - part of body: Knee     Time: 6761-9509 PT Time Calculation (min) (ACUTE ONLY): 33 min  Charges:  $Gait Training: 8-22 mins $Therapeutic Exercise: 8-22 mins                       Lillia Pauls, PT, DPT Acute Rehabilitation Services Pager [phone] Office [phone]    Norval Morton 04/20/2020, 5:24 PM

## 2020-04-20 NOTE — Progress Notes (Signed)
Physical Therapy Treatment Patient Details Name: Nancy Vasquez MRN: [id number] DOB: [birth year] Today's Date: 04/20/2020    History of Present Illness Pt is a 79 y/o female s/p L TKA. PMH includes arthritis.     PT Comments    Pt progressing steadily towards her physical therapy goals. Ambulating 10 feet x 2 with a walker at a min guard assist level; further distance limited by mild dizziness (pt reports eating minimal breakfast this morning). Able to participate in supine and seated therapeutic exercise for ROM/strengthening. Achieving 100 degrees seated knee flexion and able to reach almost neutral in knee extension. Suspect pt will continue to progress well with ambulation distance.     Follow Up Recommendations  Follow surgeon's recommendation for DC plan and follow-up therapies;Supervision for mobility/OOB     Equipment Recommendations  None recommended by PT    Recommendations for Other Services       Precautions / Restrictions Precautions Precautions: Knee Precaution Booklet Issued: No Precaution Comments: Verbally reviewed knee precautions.  Restrictions Weight Bearing Restrictions: Yes RLE Weight Bearing: Weight bearing as tolerated    Mobility  Bed Mobility Overal bed mobility: Needs Assistance Bed Mobility: Sit to Supine       Sit to supine: Min guard   General bed mobility comments: Pt able to rise to sitting position without physical assist. Increased time and effort.   Transfers Overall transfer level: Needs assistance Equipment used: Rolling walker (2 wheeled) Transfers: Sit to/from [employer] Sit to Stand: Min assist;Min guard         General transfer comment: MinA to rise from edge of bed; min guard to rise from elevated toilet height  Ambulation/Gait Ambulation/Gait assistance: Min guard Gait Distance (Feet): 20 Feet(10", 10") Assistive device: Rolling walker (2 wheeled) Gait Pattern/deviations: Step-to pattern;Decreased  stance time - left;Decreased weight shift to right;Antalgic Gait velocity: decreased Gait velocity interpretation: <1.31 ft/sec, indicative of household ambulator General Gait Details: Min guard for safety. Min VC's provided for sequencing, negotiating unlevel surfaces, weightbearing through LLE as tolerated   Stairs             Wheelchair Mobility    Modified Rankin (Stroke Patients Only)       Balance Overall balance assessment: Needs assistance Sitting-balance support: No upper extremity supported;Feet supported Sitting balance-Leahy Scale: Fair     Standing balance support: Bilateral upper extremity supported;During functional activity Standing balance-Leahy Scale: Poor Standing balance comment: Reliant on BUE support                             Cognition Arousal/Alertness: Awake/alert Behavior During Therapy: WFL for tasks assessed/performed Overall Cognitive Status: Within Functional Limits for tasks assessed                                        Exercises Total Joint Exercises Quad Sets: Left;10 reps;Supine Heel Slides: AAROM;Left;Supine;15 reps;Seated Long Arc Quad: Left;10 reps;Seated    General Comments        Pertinent Vitals/Pain Pain Assessment: Faces Faces Pain Scale: Hurts little more Pain Location: L knee Pain Descriptors / Indicators: Aching;Operative site guarding;Grimacing Pain Intervention(s): Limited activity within patient's tolerance;Monitored during session;Premedicated before session    Home Living                      Prior Function  PT Goals (current goals can now be found in the care plan section) Acute Rehab PT Goals Patient Stated Goal: to go home PT Goal Formulation: With patient Time For Goal Achievement: 05/03/20 Potential to Achieve Goals: Good Progress towards PT goals: Progressing toward goals    Frequency    7X/week      PT Plan Current plan remains  appropriate    Co-evaluation              AM-PAC PT "6 Clicks" Mobility   Outcome Measure  Help needed turning from your back to your side while in a flat bed without using bedrails?: A Little Help needed moving from lying on your back to sitting on the side of a flat bed without using bedrails?: A Little Help needed moving to and from a bed to a chair (including a wheelchair)?: A Little Help needed standing up from a chair using your arms (e.g., wheelchair or bedside chair)?: A Little Help needed to walk in hospital room?: A Little Help needed climbing 3-5 steps with a railing? : A Lot 6 Click Score: 17    End of Session Equipment Utilized During Treatment: Gait belt Activity Tolerance: Patient tolerated treatment well Patient left: in chair;with call bell/phone within reach;Other (comment)(with bone foam ) Nurse Communication: Mobility status PT Visit Diagnosis: Muscle weakness (generalized) (M62.81);Difficulty in walking, not elsewhere classified (R26.2);Pain Pain - Right/Left: Left Pain - part of body: Knee     Time: 9024-0973 PT Time Calculation (min) (ACUTE ONLY): 30 min  Charges:  $Gait Training: 8-22 mins $Therapeutic Exercise: 8-22 mins                       Wyona Almas, PT, DPT Acute Rehabilitation Services Pager [phone] Office [phone]    Deno Etienne 04/20/2020, 9:06 AM

## 2020-04-20 NOTE — Progress Notes (Signed)
Changed outer compression wrap at this time lightly soiled from urine

## 2020-04-20 NOTE — Progress Notes (Signed)
  Subjective: Nancy Vasquez is a 79 y.o. female s/p left TKA.  They are POD1.  Pt's pain is moderate but controlled.  Pt notes some dizziness with ambulation today. Pt has ambulated with some difficulty.    Objective: Vital signs in last 24 hours: Temp:  [97.8 F (36.6 C)-98.3 F (36.8 C)] 97.8 F (36.6 C) (04/23 0700) Pulse Rate:  [67-95] 95 (04/23 0700) Resp:  [13-20] 17 (04/23 0700) BP: (91-150)/(53-83) 110/63 (04/23 0700) SpO2:  [95 %-100 %] 96 % (04/23 0700) Weight:  [79 kg] 79 kg (04/22 1846)  Intake/Output from previous day: 04/22 0701 - 04/23 0700 In: 2000 [P.O.:200; I.V.:1600; IV Piggyback:200] Out: 100 [Blood:100] Intake/Output this shift: No intake/output data recorded.  Exam:  No gross blood or drainage overlying the dressing 2+ DP pulse Sensation intact distally in the left foot Able to dorsiflex and plantarflex the left foot   Labs: No results for input(s): HGB in the last 72 hours. No results for input(s): WBC, RBC, HCT, PLT in the last 72 hours. No results for input(s): NA, K, CL, CO2, BUN, CREATININE, GLUCOSE, CALCIUM in the last 72 hours. No results for input(s): LABPT, INR in the last 72 hours.  Assessment/Plan: Pt is POD1 s/p left TKA.    -Plan to discharge to home tomorrow pending patient's pain and PT eval  -WBAT with a walker  -Okay to shower, dressing is waterproof.  Cautioned patient against soaking dressing in bath/pool/body of water  -Encouraged the use of the blue cradle boot to work on extension.  Cautioned patient against using a pillow under their knee.  -Use the CPM machine at least 3 times per day for one hour each time, increasing the degrees daily.     Joycie Peek Zen Cedillos 04/20/2020, 2:33 PM

## 2020-04-20 NOTE — Progress Notes (Signed)
OT Cancellation Note  Patient Details Name: Nancy Vasquez MRN: [id number] DOB: 1941-07-01   Cancelled Treatment:    Reason Eval/Treat Not Completed: Pain limiting ability to participate.  Pt reports pain 10/10 and just received pain medications.  Will reattempt.  Eber Jones., OTR/L Acute Rehabilitation Services Pager [phone] Office [phone]   Jeani Hawking M 04/20/2020, 1:11 PM

## 2020-04-20 NOTE — Progress Notes (Signed)
Pt stable Pain ok Left foot perfused and sensate Plan PT and cpm today Dc tomorrow likely

## 2020-04-21 DIAGNOSIS — M1712 Unilateral primary osteoarthritis, left knee: Secondary | ICD-10-CM | POA: Diagnosis not present

## 2020-04-21 MED ORDER — OXYCODONE HCL 5 MG PO TABS: 5.0000 mg | ORAL_TABLET | ORAL | 0 refills | Status: DC | PRN

## 2020-04-21 MED ORDER — CELECOXIB 200 MG PO CAPS: 200.0000 mg | ORAL_CAPSULE | Freq: Two times a day (BID) | ORAL | 0 refills | Status: DC

## 2020-04-21 MED ORDER — METHOCARBAMOL 500 MG PO TABS: 500.0000 mg | ORAL_TABLET | Freq: Three times a day (TID) | ORAL | 0 refills | Status: DC | PRN

## 2020-04-21 NOTE — Progress Notes (Signed)
Provided discharge education/instructions, all questions and concerns addressed, Pt not in distress, denies pain, discharged home with belongings accompanied by daughter.

## 2020-04-21 NOTE — Progress Notes (Signed)
Tolerated or more with the bone foam- then 2hrs on CPM- at 2000 4/23- also made several trips to bathroom with walker

## 2020-04-21 NOTE — Progress Notes (Signed)
Physical Therapy Treatment Patient Details Name: Nancy Vasquez MRN: [id number] DOB: 12-14-1941 Today's Date: 04/21/2020    History of Present Illness Pt is a 79 y/o female s/p L TKA. PMH includes arthritis.     PT Comments    Patient is making progress toward PT goals and tolerated gait/stair training well. Pt and daughter educated on HEP and given HEP and stairs w/cane handouts. Pt will continue to benefit from further skilled PT services to maximize independence and safety with mobility.     Follow Up Recommendations  Follow surgeon's recommendation for DC plan and follow-up therapies;Supervision for mobility/OOB     Equipment Recommendations  None recommended by PT    Recommendations for Other Services       Precautions / Restrictions Precautions Precautions: Knee Precaution Comments: precautions and positioning reviewed with pt and pt's daughter Restrictions Weight Bearing Restrictions: Yes RLE Weight Bearing: Weight bearing as tolerated    Mobility  Bed Mobility               General bed mobility comments: OOB in chair upon arrival   Transfers Overall transfer level: Needs assistance Equipment used: Rolling walker (2 wheeled) Transfers: Sit to/from Stand Sit to Stand: Min guard;Supervision         General transfer comment: cues for hand placement when sitting down  Ambulation/Gait Ambulation/Gait assistance: Min guard Gait Distance (Feet): (~50 ft total during session) Assistive device: Rolling walker (2 wheeled) Gait Pattern/deviations: Step-through pattern;Decreased stance time - left;Decreased step length - right;Decreased dorsiflexion - left;Decreased weight shift to left;Antalgic Gait velocity: decreased   General Gait Details: cues for upright posture and increased bilat step length/height   Stairs Stairs: Yes Stairs assistance: Min guard;Min assist Stair Management: One rail Right;With cane;One rail Left;Sideways;Step to  pattern Number of Stairs: (2 steps X multiple trials ) General stair comments: practiced with R rail and cane and L rail going sideways to simulate stairs at home    Wheelchair Mobility    Modified Rankin (Stroke Patients Only)       Balance Overall balance assessment: Needs assistance Sitting-balance support: No upper extremity supported;Feet supported Sitting balance-Leahy Scale: Good     Standing balance support: During functional activity;Bilateral upper extremity supported Standing balance-Leahy Scale: Poor                              Cognition Arousal/Alertness: Awake/alert Behavior During Therapy: WFL for tasks assessed/performed Overall Cognitive Status: Within Functional Limits for tasks assessed                                        Exercises Total Joint Exercises Straight Leg Raises: AAROM;Left;5 reps    General Comments General comments (skin integrity, edema, etc.): reviewed HEP handout with pt and daughter and stairs w/ cane handout given      Pertinent Vitals/Pain Pain Assessment: Faces Faces Pain Scale: Hurts little more Pain Location: L knee Pain Descriptors / Indicators: Guarding;Sore Pain Intervention(s): Monitored during session;Premedicated before session;Repositioned    Home Living                      Prior Function            PT Goals (current goals can now be found in the care plan section) Acute Rehab PT Goals Patient Stated Goal: to go  home Progress towards PT goals: Progressing toward goals    Frequency    7X/week      PT Plan Current plan remains appropriate    Co-evaluation              AM-PAC PT "6 Clicks" Mobility   Outcome Measure  Help needed turning from your back to your side while in a flat bed without using bedrails?: A Little Help needed moving from lying on your back to sitting on the side of a flat bed without using bedrails?: A Little Help needed moving to and  from a bed to a chair (including a wheelchair)?: A Little Help needed standing up from a chair using your arms (e.g., wheelchair or bedside chair)?: A Little Help needed to walk in hospital room?: A Little Help needed climbing 3-5 steps with a railing? : A Little 6 Click Score: 18    End of Session Equipment Utilized During Treatment: Gait belt Activity Tolerance: Patient tolerated treatment well;Other (comment)(became very drowsy toward end of session ) Patient left: in chair;with call bell/phone within reach;with family/visitor present Nurse Communication: Mobility status PT Visit Diagnosis: Muscle weakness (generalized) (M62.81);Difficulty in walking, not elsewhere classified (R26.2);Pain Pain - Right/Left: Left Pain - part of body: Knee     Time: 2778-2423 PT Time Calculation (min) (ACUTE ONLY): 51 min  Charges:  $Gait Training: 23-37 mins $Therapeutic Activity: 8-22 mins                     Erline Levine, PTA Acute Rehabilitation Services Pager: [phone] Office: [phone]     Carolynne Edouard 04/21/2020, 2:22 PM

## 2020-04-21 NOTE — Progress Notes (Signed)
   Subjective:  Patient reports not bending knee well. Walking well and steady on feet.  Objective:   VITALS:   Vitals:   04/20/20 1536 04/20/20 2003 04/21/20 0554 04/21/20 0752  BP: 111/61 (!) 142/60 111/62 107/64  Pulse: 76 81 76 83  Resp: 17 18 18 17   Temp: 98.3 F (36.8 C) (!) 97.5 F (36.4 C) 98.7 F (37.1 C) 98.5 F (36.9 C)  TempSrc: Oral Oral  Oral  SpO2: 98% 99% 99% 97%  Weight:      Height:        Neurologically intact Neurovascular intact Sensation intact distally Intact pulses distally Dorsiflexion/Plantar flexion intact Incision: dressing C/D/I and no drainage No cellulitis present Compartment soft   Lab Results  Component Value Date   WBC 7.1 04/12/2020   HGB 12.7 04/12/2020   HCT 41.2 04/12/2020   MCV 97.6 04/12/2020   PLT 302 04/12/2020     Assessment/Plan:  2 Days Post-Op   - anxious about leaving today because she's worried her knee is not bending - progressing with PT appropriately - will follow her progress with PT today - patient expressed that she may want to stay another day   04/14/2020 04/21/2020, 9:01 AM [phone]

## 2020-04-21 NOTE — TOC Transition Note (Signed)
Transition of Care [facility]) - CM/SW Discharge Note   Patient Details  Name: Nancy Vasquez MRN: [id number] Date of Birth: [birth year]  Transition of Care [facility]) CM/SW Contact:  Deveron Furlong, RN 04/21/2020, 9:55 AM   Clinical Narrative:    Patient agreeable to HHPT.  Discussed Medicare rated list and patient chooses [facility].  Referral accepted by Feliberto Gottron with [facility].  No DME needs.   Final next level of care: Home w Home Health Services Barriers to Discharge: No Barriers Identified   Patient Goals and CMS Choice Patient states their goals for this hospitalization and ongoing recovery are:: to get well CMS Medicare.gov Compare Post Acute Care list provided to:: Patient Choice offered to / list presented to : Patient   Discharge Plan and Services    HH Arranged: PT [facility] Agency: [facility] Date [facility] Agency Contacted: 04/21/20 Time HH Agency Contacted: [phone] Representative spoke with at [facility] Agency: Feliberto Gottron

## 2020-04-21 NOTE — Plan of Care (Signed)

## 2020-04-21 NOTE — Evaluation (Signed)
Occupational Therapy Evaluation Patient Details Name: Nancy Vasquez MRN: [id number] DOB: 1941/06/29 Today's Date: 04/21/2020    History of Present Illness Pt is a 79 y/o female s/p L TKA. PMH includes arthritis.    Clinical Impression   PTA patient independent using cane for mobility and ADLs/IADLs. Admitted for above and limited by L knee pain, impaired balance and decreased activity tolerance.  She requires min assist for LB ADLs, min guard to supervision for transfers and limited in room mobility using RW, and setup for UB ADLs.  Educated on compensatory techniques for LB ADLs, safety, DME and recommendations.  Patient reports plans to have spouse assist with ADLs as needed, agreeable to have support when completing tub transfers at this time.  Will follow acutely to focus on tolerance and tub transfers, but anticipate no further needs after dc home.     Follow Up Recommendations  No OT follow up;Supervision - Intermittent    Equipment Recommendations  None recommended by OT    Recommendations for Other Services       Precautions / Restrictions Precautions Precautions: Knee Precaution Booklet Issued: No Precaution Comments: Verbally reviewed knee precautions.  Restrictions Weight Bearing Restrictions: Yes RLE Weight Bearing: Weight bearing as tolerated      Mobility Bed Mobility               General bed mobility comments: OOB in recliner upon entyr   Transfers Overall transfer level: Needs assistance   Transfers: Sit to/from Stand Sit to Stand: Min guard;Supervision         General transfer comment: initally min guard for safety, fading to supervision; cueing for hand placement and L LE mgmt     Balance Overall balance assessment: Needs assistance Sitting-balance support: No upper extremity supported;Feet supported Sitting balance-Leahy Scale: Fair     Standing balance support: No upper extremity supported;During functional activity;Bilateral upper  extremity supported Standing balance-Leahy Scale: Poor Standing balance comment: able to engage in ADLs with 1-0 UE support, but reliant on BUE support dynamically                           ADL either performed or assessed with clinical judgement   ADL Overall ADL's : Needs assistance/impaired     Grooming: Set up;Sitting   Upper Body Bathing: Set up;Sitting   Lower Body Bathing: Minimal assistance;Sit to/from stand   Upper Body Dressing : Set up;Sitting   Lower Body Dressing: Minimal assistance;Sit to/from stand Lower Body Dressing Details (indicate cue type and reason): for mgmt of L LE sock and threading underwear  Toilet Transfer: Min guard;BSC;Ambulation;RW   Toileting- Clothing Manipulation and Hygiene: Min guard;Sit to/from stand       Functional mobility during ADLs: Min guard;Rolling walker;Cueing for safety;Cueing for sequencing General ADL Comments: pt limited by R knee pain     Vision         Perception     Praxis      Pertinent Vitals/Pain Pain Assessment: Faces Faces Pain Scale: Hurts little more Pain Location: L knee Pain Descriptors / Indicators: Aching;Operative site guarding;Grimacing Pain Intervention(s): Limited activity within patient's tolerance;Monitored during session;Repositioned     Hand Dominance     Extremity/Trunk Assessment Upper Extremity Assessment Upper Extremity Assessment: Overall WFL for tasks assessed   Lower Extremity Assessment Lower Extremity Assessment: Defer to PT evaluation   Cervical / Trunk Assessment Cervical / Trunk Assessment: Normal   Communication Communication Communication: No difficulties  Cognition Arousal/Alertness: Awake/alert Behavior During Therapy: WFL for tasks assessed/performed Overall Cognitive Status: Within Functional Limits for tasks assessed                                     General Comments       Exercises     Shoulder Instructions      Home  Living Family/patient expects to be discharged to:: Private residence Living Arrangements: Spouse/significant other Available Help at Discharge: Family Type of Home: House Home Access: Stairs to enter Secretary/administrator of Steps: 1 Entrance Stairs-Rails: None Home Layout: Multi-level Alternate Level Stairs-Number of Steps: 6-7 up and down  Alternate Level Stairs-Rails: Right;Left Bathroom Shower/Tub: Chief Strategy Officer: Handicapped height     Home Equipment: Environmental consultant - 2 wheels;Bedside commode;Cane - single point;Grab bars - tub/shower          Prior Functioning/Environment Level of Independence: Independent with assistive device(s)        Comments: Used cane for ambulation, indpendent ADLs, IADLs         OT Problem List: Decreased strength;Decreased activity tolerance;Impaired balance (sitting and/or standing);Pain;Decreased knowledge of use of DME or AE;Decreased knowledge of precautions      OT Treatment/Interventions: Self-care/ADL training;DME and/or AE instruction;Therapeutic activities;Balance training;Patient/family education    OT Goals(Current goals can be found in the care plan section) Acute Rehab OT Goals Patient Stated Goal: to go home OT Goal Formulation: With patient Time For Goal Achievement: 05/05/20 Potential to Achieve Goals: Good  OT Frequency: Min 2X/week   Barriers to D/C:            Co-evaluation              AM-PAC OT "6 Clicks" Daily Activity     Outcome Measure Help from another person eating meals?: None Help from another person taking care of personal grooming?: A Little Help from another person toileting, which includes using toliet, bedpan, or urinal?: A Little Help from another person bathing (including washing, rinsing, drying)?: A Little Help from another person to put on and taking off regular upper body clothing?: A Little Help from another person to put on and taking off regular lower body clothing?: A  Little 6 Click Score: 19   End of Session Equipment Utilized During Treatment: Rolling walker CPM Left Knee CPM Left Knee: Off Nurse Communication: Mobility status  Activity Tolerance: Patient tolerated treatment well Patient left: in chair;with call bell/phone within reach  OT Visit Diagnosis: Other abnormalities of gait and mobility (R26.89);Muscle weakness (generalized) (M62.81);Pain Pain - Right/Left: Left Pain - part of body: Knee                Time: 7169-6789 OT Time Calculation (min): 34 min Charges:  OT General Charges $OT Visit: 1 Visit OT Evaluation $OT Eval Low Complexity: 1 Low OT Treatments $Self Care/Home Management : 8-22 mins  Barry Brunner, OT Acute Rehabilitation Services Pager [phone] Office [phone]    Chancy Milroy 04/21/2020, 9:32 AM

## 2020-04-23 ENCOUNTER — Emergency Department (HOSPITAL_COMMUNITY)
Admission: EM | Admit: 2020-04-23 | Discharge: 2020-04-23 | Discharge status: Against Medical Advice | Payer: Medicare Other | Attending: Emergency Medicine | Admitting: Emergency Medicine

## 2020-04-23 ENCOUNTER — Other Ambulatory Visit: Payer: Self-pay

## 2020-04-23 ENCOUNTER — Other Ambulatory Visit: Payer: Self-pay | Admitting: Surgical

## 2020-04-23 ENCOUNTER — Telehealth: Payer: Self-pay | Admitting: Orthopedic Surgery

## 2020-04-23 ENCOUNTER — Telehealth: Payer: Self-pay

## 2020-04-23 ENCOUNTER — Encounter: Payer: Self-pay | Admitting: *Deleted

## 2020-04-23 ENCOUNTER — Telehealth: Payer: Self-pay | Admitting: Surgical

## 2020-04-23 DIAGNOSIS — Z5321 Procedure and treatment not carried out due to patient leaving prior to being seen by health care provider: Secondary | ICD-10-CM | POA: Diagnosis not present

## 2020-04-23 DIAGNOSIS — R1110 Vomiting, unspecified: Secondary | ICD-10-CM | POA: Diagnosis present

## 2020-04-23 LAB — COMPREHENSIVE METABOLIC PANEL
ALT: 19 U/L (ref 0–44)
AST: 20 U/L (ref 15–41)
Albumin: 3.2 g/dL — ABNORMAL LOW (ref 3.5–5.0)
Alkaline Phosphatase: 55 U/L (ref 38–126)
Anion gap: 10 (ref 5–15)
BUN: 10 mg/dL (ref 8–23)
CO2: 24 mmol/L (ref 22–32)
Calcium: 8.7 mg/dL — ABNORMAL LOW (ref 8.9–10.3)
Chloride: 102 mmol/L (ref 98–111)
Creatinine, Ser: 0.72 mg/dL (ref 0.44–1.00)
GFR calc Af Amer: >60 mL/min (ref >60)
GFR calc non Af Amer: >60 mL/min (ref >60)
Glucose, Bld: 122 mg/dL — ABNORMAL HIGH (ref 70–99)
Potassium: 3.9 mmol/L (ref 3.5–5.1)
Sodium: 136 mmol/L (ref 135–145)
Total Bilirubin: 1.2 mg/dL (ref 0.3–1.2)
Total Protein: 6.5 g/dL (ref 6.5–8.1)

## 2020-04-23 LAB — CBC
HCT: 29.8 % — ABNORMAL LOW (ref 36.0–46.0)
Hemoglobin: 9.3 g/dL — ABNORMAL LOW (ref 12.0–15.0)
MCH: 29.9 pg (ref 26.0–34.0)
MCHC: 31.2 g/dL (ref 30.0–36.0)
MCV: 95.8 fL (ref 80.0–100.0)
Platelets: 287 10*3/uL (ref 150–400)
RBC: 3.11 MIL/uL — ABNORMAL LOW (ref 3.87–5.11)
RDW: 12.2 % (ref 11.5–15.5)
WBC: 11.8 10*3/uL — ABNORMAL HIGH (ref 4.0–10.5)
nRBC: 0 % (ref 0.0–0.2)

## 2020-04-23 LAB — LIPASE, BLOOD: Lipase: 15 U/L (ref 11–51)

## 2020-04-23 MED ORDER — SODIUM CHLORIDE 0.9% FLUSH: 3.0000 mL | Freq: Once | INTRAVENOUS | Status: DC

## 2020-04-23 MED ORDER — PROMETHAZINE HCL 25 MG PO TABS: 25.0000 mg | ORAL_TABLET | Freq: Three times a day (TID) | ORAL | 0 refills | Status: DC | PRN

## 2020-04-23 MED ORDER — HYDROCODONE-ACETAMINOPHEN 5-325 MG PO TABS: 1.0000 | ORAL_TABLET | ORAL | 0 refills | Status: DC | PRN

## 2020-04-23 NOTE — Telephone Encounter (Signed)
Patient's daughter Alvino Chapel called triage line. Her mother was released from hospital Saturday, following total knee arthroplasty with Dr.Dean on 04/19/2020. Her daughter is concerned because there is bruising on the posterior side of her thigh, that they did not notice yesterday. She has also been vomiting since yesterday and not eating much. She is taking oxycodone for pain every 4-6hours, but states that she did not take any oxycodone yesterday. She is hoping that Dr.Dean will call in promethazine for patient.  Please call daughter back at #[phone]. Thanks!

## 2020-04-23 NOTE — ED Notes (Signed)
Pt tearful, states that she wanted to leave due to wait time. Pt encouraged to stay, instructed to return for any increase in symptoms etc.

## 2020-04-23 NOTE — Telephone Encounter (Signed)
N[facility] Vasquez with [facility] verbal order for PT 3x a week for 1 week , 2x a week for 4 weeks, 1x a week  for 4 weeks beginning 04/22/20. And also need instruction for the knee mobilizer.  Nancy Vasquez's call back # [phone]

## 2020-04-23 NOTE — Telephone Encounter (Signed)
[facility] advised per note below. They will call with any other questions or if patient does not improve.

## 2020-04-23 NOTE — Telephone Encounter (Signed)
Patient's daughter called and I called her back.  Spoke with patient's daughter regarding patient's recovery from surgery.  She is status post knee replacement and her main complaint these days is nausea/vomiting with abdominal pain.  This seems to be getting worse and Phenergan was prescribed today which provided no relief from vomiting.  She has only had 1 bowel movement since surgery which was "unsatisfactory".  Additionally, she has not had much of an appetite and has had difficulty keeping food/medications down without vomiting.  Strongly encouraged patient to report to the ED this evening for evaluation of obstruction.  Patient's daughter agreed and affirmed that she will make sure her mother goes to the ER tonight.

## 2020-04-23 NOTE — Telephone Encounter (Signed)
Pls advise on immobilizer

## 2020-04-23 NOTE — Telephone Encounter (Signed)
Pls advise. Thanks.  

## 2020-04-23 NOTE — Telephone Encounter (Signed)
Keep it on when ambulating.  May discontinue when she can perform 10-15 straight leg raises in a row in PT

## 2020-04-23 NOTE — ED Triage Notes (Signed)
Pt reports recent left knee replacement and was dc home Saturday. Having n/v and weakness since getting home. Denies fever. No acute distress noted.

## 2020-04-24 ENCOUNTER — Telehealth: Payer: Self-pay | Admitting: Orthopedic Surgery

## 2020-04-24 NOTE — Telephone Encounter (Signed)
Patient advised.

## 2020-04-24 NOTE — Telephone Encounter (Signed)
I have messaged Dorene Sorrow to see if patient is on their service for HHPT

## 2020-04-24 NOTE — Telephone Encounter (Signed)
hmm

## 2020-04-24 NOTE — Telephone Encounter (Signed)
Advised Nancy Vasquez

## 2020-04-24 NOTE — Telephone Encounter (Signed)
Per [facility] they are to see patient today.

## 2020-04-24 NOTE — Telephone Encounter (Signed)
Patient called wanting to know what type of PT she is going to receive.  She is requesting HH PT.  CB#[phone].  Thank you.

## 2020-04-26 NOTE — Telephone Encounter (Signed)
thx

## 2020-05-01 ENCOUNTER — Other Ambulatory Visit: Payer: Self-pay | Admitting: Surgical

## 2020-05-01 MED ORDER — BUSPIRONE HCL 5 MG PO TABS: 5.0000 mg | ORAL_TABLET | Freq: Three times a day (TID) | ORAL | 0 refills | Status: DC

## 2020-05-02 ENCOUNTER — Other Ambulatory Visit: Payer: Self-pay | Admitting: Surgical

## 2020-05-02 ENCOUNTER — Inpatient Hospital Stay: Payer: Medicare Other | Admitting: Orthopedic Surgery

## 2020-05-02 MED ORDER — TRAMADOL HCL 50 MG PO TABS: 50.0000 mg | ORAL_TABLET | Freq: Three times a day (TID) | ORAL | 0 refills | Status: DC | PRN

## 2020-05-03 ENCOUNTER — Telehealth: Payer: Self-pay | Admitting: *Deleted

## 2020-05-03 ENCOUNTER — Ambulatory Visit (HOSPITAL_COMMUNITY)
Admission: RE | Admit: 2020-05-03 | Discharge: 2020-05-03 | Disposition: A | Discharge status: Home / Self Care | Payer: Medicare Other | Source: Ambulatory Visit | Attending: Surgical | Admitting: Surgical

## 2020-05-03 ENCOUNTER — Encounter: Payer: Self-pay | Admitting: Surgical

## 2020-05-03 ENCOUNTER — Other Ambulatory Visit: Payer: Self-pay

## 2020-05-03 ENCOUNTER — Ambulatory Visit: Payer: Self-pay

## 2020-05-03 ENCOUNTER — Ambulatory Visit (INDEPENDENT_AMBULATORY_CARE_PROVIDER_SITE_OTHER): Payer: Medicare Other | Admitting: Surgical

## 2020-05-03 DIAGNOSIS — Z96652 Presence of left artificial knee joint: Secondary | ICD-10-CM | POA: Insufficient documentation

## 2020-05-03 NOTE — Progress Notes (Signed)
.   Post-Op Visit Note   Patient: Nancy Vasquez           Date of Birth: 06-12-1941           MRN: [id number] Visit Date: 05/03/2020 PCP: Macy Mis, MD   Assessment & Plan:  Chief Complaint:  Chief Complaint  Patient presents with  . Post-op Follow-up   Visit Diagnoses:  1. Status post total left knee replacement     Plan: Patient is 79 year old female presents s/p left total knee arthroplasty on 04/19/2020.  She notes that she is doing well and continues to improve.  She initially had some difficulty with nausea/vomiting and lack of appetite but this has all improved and returned to normal.  She is using CPM machine is up to 65 degrees.  On exam she has 0 degrees of extension and 65 to 70 degrees of flexion.  Her incision is healing well.  She does have some calf tenderness on exam with unilateral leg swelling compared to the contralateral leg.  She is ambulating with a knee immobilizer and a walker.  She is taking tramadol and BuSpar with good relief of pain and anxiety.  Referred patient to outpatient physical therapy upstairs in this facility.  Highly encouraged her to work on knee flexion but her extension is doing well.  Radiographs of the left knee taken today reveal a left total knee prosthesis in good position and alignment without complicating features.  She does complain of chills but she has taken her temperature multiple times without a significant fever.  No drainage is been noted from her incision.  No evidence of dehiscence.  Ordered ultrasound of the left lower extremity to evaluate for DVT.  Additionally, patient will follow up in 2 weeks for clinical recheck.  Want to check her motion at that time to make sure that her flexion is improving.   Follow-Up Instructions: No follow-ups on file.   Orders:  Orders Placed This Encounter  Procedures  . XR Knee 1-2 Views Left  . Ambulatory referral to Physical Therapy  . VAS [place] LOWER EXTREMITY VENOUS (DVT)   No orders  of the defined types were placed in this encounter.   Imaging: No results found.  PMFS History: Patient Active Problem List   Diagnosis Date Noted  . Arthritis of left knee 04/19/2020   Past Medical History:  Diagnosis Date  . Arthritis    has had right hip replacement    Family History  Problem Relation Age of Onset  . Breast cancer Neg Hx     Past Surgical History:  Procedure Laterality Date  . ABDOMINAL HYSTERECTOMY    . BREAST EXCISIONAL BIOPSY Left   . INTRAVASCULAR PRESSURE WIRE/FFR STUDY N/A 03/17/2017   Procedure: Intravascular Pressure Wire/FFR Study;  Surgeon: Rinaldo Cloud, MD;  Location: [facility] INVASIVE CV LAB;  Service: Cardiovascular;  Laterality: N/A;  mid LAD  . LEFT HEART CATH AND CORONARY ANGIOGRAPHY N/A 03/17/2017   Procedure: Left Heart Cath and Coronary Angiography;  Surgeon: Rinaldo Cloud, MD;  Location: [facility] INVASIVE CV LAB;  Service: Cardiovascular;  Laterality: N/A;  . TONSILLECTOMY     Removed as a child  . 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   Procedure: LEFT TOTAL KNEE ARTHROPLASTY;  Surgeon: Cammy Copa, MD;  Location: [facility] OR;  Service: Orthopedics;  Laterality: Left;   Social History   Occupational History  . Not on file  Tobacco Use  . Smoking status: Never Smoker  . Smokeless  tobacco: Never Used  Substance and Sexual Activity  . Alcohol use: Never  . Drug use: Never  . Sexual activity: Not on file

## 2020-05-03 NOTE — Telephone Encounter (Signed)
Vascular US scheduled today at [facility] at 3p

## 2020-05-03 NOTE — Progress Notes (Signed)
VASCULAR LAB PRELIMINARY  PRELIMINARY  PRELIMINARY  PRELIMINARY  Left lower extremity venous duplex completed.    Preliminary report:  See CV proc for preliminary results.  Called [phone], no answer, voicemail full.  Called [phone], no answer when pressing option 1, operator not available  Kynzi Levay, RVT 05/03/2020, 3:14 PM

## 2020-05-08 NOTE — Discharge Summary (Signed)
Physician Discharge Summary      Patient ID: Nancy Vasquez MRN: [id number] DOB/AGE: [birth year] 79 y.o.  Admit date: 04/19/2020 Discharge date: 04/21/2020  Admission Diagnoses:  Active Problems:   Arthritis of left knee   Discharge Diagnoses:  Same  Surgeries: Procedure(s): LEFT TOTAL KNEE ARTHROPLASTY on 04/19/2020   Consultants:   Discharged Condition: Stable  Hospital Course: Nancy Vasquez is an 79 y.o. 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  They were brought to the operating room on 04/19/2020 and underwent the above named procedures.  Pt awoke from anesthesia without complication and was transferred to the floor. On POD1, pain was controlled but patient had some dizziness with ambulation and lack of appetite.  She wanted to stay longer in the hospital to regain her strength and confidence with mobility.  On postop day 2 she was walking with more stability and felt ready for discharge home.  She was discharged home on postop day 2.  Pt will f/u with Dr. August Saucer in clinic in ~2 weeks.   Antibiotics given:  Anti-infectives (From admission, onward)   Start     Dose/Rate Route Frequency Ordered Stop   04/19/20 1830  ceFAZolin (ANCEF) IVPB 2g/100 mL premix     2 g 200 mL/hr over 30 Minutes Intravenous Every 6 hours 04/19/20 1706 04/20/20 0017   04/19/20 1005  ceFAZolin (ANCEF) 2-4 GM/100ML-% IVPB    Note to Pharmacy: Pauletta Browns   : cabinet override      04/19/20 1005 04/19/20 1244   04/19/20 1000  ceFAZolin (ANCEF) IVPB 2g/100 mL premix     2 g 200 mL/hr over 30 Minutes Intravenous On call to O.R. 04/19/20 0957 04/19/20 1250    .  Recent vital signs:  Vitals:   04/21/20 0554 04/21/20 0752  BP: 111/62 107/64  Pulse: 76 83  Resp: 18 17  Temp: 98.7 F (37.1 C) 98.5 F (36.9 C)  SpO2: 99% 97%    Recent laboratory studies:  Results for orders placed or performed  during the hospital encounter of 04/16/20  SARS CORONAVIRUS 2 (TAT 6-24 HRS) Nasopharyngeal Nasopharyngeal Swab   Specimen: Nasopharyngeal Swab  Result Value Ref Range   SARS Coronavirus 2 NEGATIVE NEGATIVE    Discharge Medications:   Allergies as of 04/21/2020      Reactions   Ibuprofen Nausea And Vomiting, Other (See Comments)   "burns stomach"      Medication List    STOP taking these medications   amoxicillin 500 MG tablet Commonly known as: AMOXIL     TAKE these medications   acetaminophen 650 MG CR tablet Commonly known as: Tylenol 8 Hour Take 2 tablets (1,300 mg total) by mouth every 8 (eight) hours as needed for pain. What changed: how much to take Notes to patient: Last dose given 04/23 12:19   amLODipine 2.5 MG tablet Commonly known as: NORVASC Take 2.5 mg by mouth daily.   aspirin 81 MG tablet Take 81 mg by mouth daily.   atorvastatin 20 MG tablet Commonly known as: LIPITOR Take 20 mg by mouth daily.   celecoxib 200 MG capsule Commonly known as: CELEBREX Take 1 capsule (200 mg total) by mouth 2 (two) times daily.   ergocalciferol 1.25 MG (50000 UT) capsule Commonly known as: VITAMIN D2 Take 50,000 Units by mouth once a week. Takes on Thursday Notes to patient: Resume home regimen   methocarbamol  500 MG tablet Commonly known as: ROBAXIN Take 1 tablet (500 mg total) by mouth every 8 (eight) hours as needed for muscle spasms. Notes to patient: Last dose given 04/25 10:35   nitroGLYCERIN 0.4 MG SL tablet Commonly known as: NITROSTAT Place 0.4 mg under the tongue every 5 (five) minutes as needed for chest pain. Notes to patient: Resume home regimen   oxyCODONE 5 MG immediate release tablet Commonly known as: Oxy IR/ROXICODONE Take 1 tablet (5 mg total) by mouth every 4 (four) hours as needed for moderate pain (pain score 4-6). Notes to patient: Last dose given 04/25 10:35   STOPAIN ROLL-ON EX Apply 1 application topically daily as needed (pain).     BIOFREEZE EX Apply 1 application topically daily as needed (pain).   VICKS VAPORUB EX Apply 1 application topically daily as needed (congestion).       Diagnostic Studies: VAS [place] LOWER EXTREMITY VENOUS (DVT)  Result Date: 05/03/2020  Lower Venous DVT Study Indications: Pain, Swelling, and Recent TKR 04/19/20.  Comparison Study: No prior study on file Performing Technologist: Sharion Dove RVS  Examination Guidelines: A complete evaluation includes B-mode imaging, spectral Doppler, color Doppler, and power Doppler as needed of all accessible portions of each vessel. Bilateral testing is considered an integral part of a complete examination. Limited examinations for reoccurring indications may be performed as noted. The reflux portion of the exam is performed with the patient in reverse Trendelenburg.  +-----+---------------+---------+-----------+----------+--------------+  RIGHT Compressibility Phasicity Spontaneity Properties Thrombus Aging  +-----+---------------+---------+-----------+----------+--------------+  CFV   Full            Yes       Yes                                    +-----+---------------+---------+-----------+----------+--------------+   +---------+---------------+---------+-----------+----------+--------------+  LEFT      Compressibility Phasicity Spontaneity Properties Thrombus Aging  +---------+---------------+---------+-----------+----------+--------------+  CFV       Full            Yes       Yes                                    +---------+---------------+---------+-----------+----------+--------------+  SFJ       Full                                                             +---------+---------------+---------+-----------+----------+--------------+  FV Prox   Full                                                             +---------+---------------+---------+-----------+----------+--------------+  FV Mid    Full                                                              +---------+---------------+---------+-----------+----------+--------------+  FV Distal Full                                                             +---------+---------------+---------+-----------+----------+--------------+  PFV       Full                                                             +---------+---------------+---------+-----------+----------+--------------+  POP       Full            Yes       Yes                                    +---------+---------------+---------+-----------+----------+--------------+  PTV       Full                                                             +---------+---------------+---------+-----------+----------+--------------+  PERO      Full                                                             +---------+---------------+---------+-----------+----------+--------------+     Summary: RIGHT: - No evidence of common femoral vein obstruction.  LEFT: - There is no evidence of deep vein thrombosis in the lower extremity.  *See table(s) above for measurements and observations. Electronically signed by Lemar Livings MD on 05/03/2020 at 5:27:11 PM.    Final     Disposition: Discharge disposition: 01-Home or Self Care       Discharge Instructions    Call MD / Call 911   Complete by: As directed    If you experience chest pain or shortness of breath, CALL 911 and be transported to the hospital emergency room.  If you develope a fever above 101 F, pus (white drainage) or increased drainage or redness at the wound, or calf pain, call your surgeon's office.   Call MD / Call 911   Complete by: As directed    If you experience chest pain or shortness of breath, CALL 911 and be transported to the hospital emergency room.  If you develope a fever above 101.5 F, pus (white drainage) or increased drainage or redness at the wound, or calf pain, call your surgeon's office.   Constipation Prevention   Complete by: As directed    Drink plenty of fluids.  Prune juice may  be helpful.  You may use a stool softener, such as Colace (over the counter) 100 mg twice a day.  Use MiraLax (over the counter) for constipation as needed.   Constipation Prevention   Complete by: As directed    Drink plenty  of fluids.  Prune juice may be helpful.  You may use a stool softener, such as Colace (over the counter) 100 mg twice a day.  Use MiraLax (over the counter) for constipation as needed.   Diet - low sodium heart healthy   Complete by: As directed    Discharge instructions   Complete by: As directed    You may shower, dressing is waterproof.  Do not remove the dressing, we will remove it at your first post-op appointment.  Do not take a bath or soak the knee in a tub or pool.  You may weightbear as you can tolerate on the operative leg with a walker.  Continue using the CPM machine 3 times per day for one hour each time, increasing the degrees of range of motion daily.  Use the blue cradle boot under your heel to work on getting your leg straight.  Do NOT put a pillow under your knee.  You will follow-up with Dr. August Saucer in the clinic in 1-2 weeks at your given appointment date.   Driving restrictions   Complete by: As directed    No driving while taking narcotic pain meds.   Increase activity slowly as tolerated   Complete by: As directed    Increase activity slowly as tolerated   Complete by: As directed       Follow-up Information    [place] Follow up.   Why: Agency will contact you to arrange physical therapy visits.  Contact information: [address] [place] [address] [phone]            Signed: Julieanne Cotton 05/08/2020, 1:08 PM

## 2020-05-21 ENCOUNTER — Ambulatory Visit (INDEPENDENT_AMBULATORY_CARE_PROVIDER_SITE_OTHER): Payer: Medicare Other | Admitting: Orthopedic Surgery

## 2020-05-21 ENCOUNTER — Encounter: Payer: Self-pay | Admitting: Orthopedic Surgery

## 2020-05-21 ENCOUNTER — Other Ambulatory Visit: Payer: Self-pay

## 2020-05-21 VITALS — Ht 66.0 in | Wt 175.0 lb

## 2020-05-21 DIAGNOSIS — Z96652 Presence of left artificial knee joint: Secondary | ICD-10-CM

## 2020-05-21 MED ORDER — TRAMADOL HCL 50 MG PO TABS: 50.0000 mg | ORAL_TABLET | Freq: Two times a day (BID) | ORAL | 0 refills | Status: DC | PRN

## 2020-05-23 ENCOUNTER — Ambulatory Visit: Payer: Medicare Other | Attending: Surgical | Admitting: Physical Therapy

## 2020-05-23 ENCOUNTER — Other Ambulatory Visit: Payer: Self-pay

## 2020-05-23 ENCOUNTER — Encounter: Payer: Self-pay | Admitting: Physical Therapy

## 2020-05-23 DIAGNOSIS — M25562 Pain in left knee: Secondary | ICD-10-CM | POA: Diagnosis present

## 2020-05-23 DIAGNOSIS — R2689 Other abnormalities of gait and mobility: Secondary | ICD-10-CM | POA: Insufficient documentation

## 2020-05-23 DIAGNOSIS — M25662 Stiffness of left knee, not elsewhere classified: Secondary | ICD-10-CM | POA: Diagnosis present

## 2020-05-23 DIAGNOSIS — M6281 Muscle weakness (generalized): Secondary | ICD-10-CM | POA: Diagnosis present

## 2020-05-23 DIAGNOSIS — R600 Localized edema: Secondary | ICD-10-CM | POA: Diagnosis present

## 2020-05-23 NOTE — Therapy (Signed)
[facility] [address] [place], [place], [address] Phone: [phone]   Fax:  [phone]  Physical Therapy Evaluation  Patient Details  Name: Nancy Vasquez MRN: [id number] Date of Birth: 04-21-1941 Referring Provider (PT): Dr Keitha Butte   Encounter Date: 05/23/2020  PT End of Session - 05/23/20 1533    Visit Number  1    Number of Visits  12    Date for PT Re-Evaluation  07/04/20    Authorization Type  [employer] MCR    Authorization Time Period  pnote 10th visit    PT Start Time  1533       Past Medical History:  Diagnosis Date  . Arthritis    has had right hip replacement    Past Surgical History:  Procedure Laterality Date  . ABDOMINAL HYSTERECTOMY    . BREAST EXCISIONAL BIOPSY Left   . INTRAVASCULAR PRESSURE WIRE/FFR STUDY N/A 03/17/2017   Procedure: Intravascular Pressure Wire/FFR Study;  Surgeon: Rinaldo Cloud, MD;  Location: [facility] INVASIVE CV LAB;  Service: Cardiovascular;  Laterality: N/A;  mid LAD  . LEFT HEART CATH AND CORONARY ANGIOGRAPHY N/A 03/17/2017   Procedure: Left Heart Cath and Coronary Angiography;  Surgeon: Rinaldo Cloud, MD;  Location: [facility] INVASIVE CV LAB;  Service: Cardiovascular;  Laterality: N/A;  . TONSILLECTOMY     Removed as a child  . 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   Procedure: LEFT TOTAL KNEE ARTHROPLASTY;  Surgeon: Cammy Copa, MD;  Location: [facility] OR;  Service: Orthopedics;  Laterality: Left;    There were no vitals filed for this visit.   Subjective Assessment - 05/23/20 1537    Subjective  Pt had an elective knee replacement in April - 4 wks now,  Had HHPT until last week , performing her HEP and using ice PRN    Patient Stated Goals  bend her Lt knee and use it    Currently in Pain?  Yes    Pain Score  2     Pain Location  Knee    Pain Orientation  Left    Pain Descriptors / Indicators  Aching;Dull    Pain Type  Surgical pain    Pain Onset  1 to 4 weeks ago    Pain  Frequency  Intermittent    Aggravating Factors   sleeping, prolonged sitting    Pain Relieving Factors  gentle walking         [facility] PT Assessment - 05/23/20 0001      Assessment   Medical Diagnosis  Lt TKA     Referring Provider (PT)  Dr Keitha Butte    Onset Date/Surgical Date  04/19/20    Next MD Visit  07/04/2020    Prior Therapy  HHPT      Precautions   Precautions  None      Balance Screen   Has the patient fallen in the past 6 months  No    Has the patient had a decrease in activity level because of a fear of falling?   No    Is the patient reluctant to leave their home because of a fear of falling?   No      Home Public house manager residence    Living Arrangements  Spouse/significant other    Home Layout  Multi-level   3 level     Prior Function   Level of Independence  Independent    Vocation  Retired  Leisure  travel,       Observation/Other Assessments   Focus on Therapeutic Outcomes (FOTO)   43% limited      Observation/Other Assessments-Edema    Edema  --   (+) in Lt knee     Functional Tests   Functional tests  Squat;Single leg stance      Squat   Comments  slight increase in motion with eccentric lowerin      Single Leg Stance   Comments  Rt 2 sec, Lt unable       ROM / Strength   AROM / PROM / Strength  AROM;PROM;Strength      AROM   AROM Assessment Site  Knee    Right/Left Knee  Left;Right    Right Knee Extension  -2    Right Knee Flexion  130    Left Knee Extension  -2    Left Knee Flexion  88      PROM   PROM Assessment Site  Knee    Right/Left Knee  Left    Left Knee Extension  0    Left Knee Flexion  98      Strength   Strength Assessment Site  Hip;Knee;Ankle    Right/Left Hip  Left    Left Hip Flexion  4+/5   (+) quad lag    Right/Left Knee  --   Rt 5/5, Lt flex 4/5, ext 3-/5 broke d/t pain   Right/Left Ankle  --   5/5     Flexibility   Soft Tissue Assessment /Muscle Length  --   some tightness in  bilat quad      Palpation   Patella mobility  hypomobile Lt    Palpation comment  decreased scar mobilization , some muscle tightness/spasming in Lt quad      Ambulation/Gait   Assistive device  Straight cane    Gait Pattern  Decreased stance time - left;Antalgic                  Objective measurements completed on examination: See above findings.      [facility] Adult PT Treatment/Exercise - 05/23/20 0001      Self-Care   Self-Care  Other Self-Care Comments    Other Self-Care Comments   modified HEP to strap assist for heel slide, seated knee flex with scooting hips FWD, and long sit quad sets to visualize contraction      Exercises   Exercises  Knee/Hip      Knee/Hip Exercises: Aerobic   Stationary Bike  for ROM - made 1 revolution BWD and was then afraid to do it again      Modalities   Modalities  Vasopneumatic      Vasopneumatic   Number Minutes Vasopneumatic   15 minutes    Vasopnuematic Location   Knee   Lt   Vasopneumatic Pressure  Medium    Vasopneumatic Temperature   3*             PT Education - 05/23/20 1608    Education Details  HEP modfication per notes, POC and FOTO    Person(s) Educated  Patient    Methods  Explanation;Demonstration    Comprehension  Returned demonstration;Verbalized understanding       PT Short Term Goals - 05/23/20 1634      PT SHORT TERM GOAL #1   Title  i with initial HEP for proprioception    Time  3    Period  Weeks  Status  New    Target Date  06/13/20      PT SHORT TERM GOAL #2   Title  improve Lt knee flexion =/> 105 degrees to assist with stairs    Time  3    Period  Weeks    Status  New    Target Date  06/13/20      PT SHORT TERM GOAL #3   Title  walk with normalized gait on even surfaces    Time  3    Period  Weeks    Status  New    Target Date  06/13/20        PT Long Term Goals - 05/23/20 1635      PT LONG TERM GOAL #1   Title  i with advanced HEP to include return to gym    Time   6    Period  Weeks    Status  New    Target Date  07/04/20      PT LONG TERM GOAL #2   Title  improve Lt knee ext to flexion 0-120 to allow her to get on off the floor    Time  6    Period  Weeks    Status  New    Target Date  07/04/20      PT LONG TERM GOAL #3   Title  improve Lt LE strength =/> 5-/5 to allow her to alternate steps on her stairs    Time  6    Period  Weeks    Status  New    Target Date  07/04/20      PT LONG TERM GOAL #4   Title  improve FOTO =/< 29% limited    Time  6    Period  Weeks    Status  New    Target Date  07/04/20      PT LONG TERM GOAL #5   Title  report no more than 2/10 Lt knee pain at night to allow her to sleep per her previous level    Time  6    Period  Weeks    Status  New    Target Date  07/04/20             Plan - 05/23/20 1610    Clinical Impression Statement  79 yo female s/p Lt TKA 04/19/2020, she had a couple weeks of HHPT and is referred to PT to continue her rehab.  She has limited knee motion, knee and hip weakness, swelling and gait abnormalities.  Would benefit form Pt to restore her PLOF and maximize motion in her knee.    Personal Factors and Comorbidities  Comorbidity 1    Comorbidities  arthirtis    Examination-Activity Limitations  Bathing;Locomotion Level;Stairs    Examination-Participation Restrictions  Community Activity;Other    Stability/Clinical Decision Making  Stable/Uncomplicated    Clinical Decision Making  Low    Rehab Potential  Good    PT Frequency  2x / week    PT Duration  6 weeks       Patient will benefit from skilled therapeutic intervention in order to improve the following deficits and impairments:  Abnormal gait, Decreased range of motion, Pain, Decreased strength, Decreased mobility, Decreased scar mobility, Increased muscle spasms  Visit Diagnosis: Stiffness of left knee, not elsewhere classified - Plan: PT plan of care cert/re-cert  Acute pain of left knee - Plan: PT plan of care  cert/re-cert  Muscle  weakness (generalized) - Plan: PT plan of care cert/re-cert  Other abnormalities of gait and mobility - Plan: PT plan of care cert/re-cert  Localized edema - Plan: PT plan of care cert/re-cert     Problem List Patient Active Problem List   Diagnosis Date Noted  . Arthritis of left knee 04/19/2020    Roderic Scarce PT  05/23/2020, 4:43 PM  [facility] [address] [place], [place], [address] Phone: [phone]   Fax:  [phone]  Name: Nancy Vasquez MRN: [id number] Date of Birth: 12-Jan-1941

## 2020-05-24 ENCOUNTER — Other Ambulatory Visit: Payer: Self-pay | Admitting: Surgical

## 2020-05-24 NOTE — Telephone Encounter (Signed)
Patient notified

## 2020-05-24 NOTE — Telephone Encounter (Signed)
Please advise 

## 2020-05-27 ENCOUNTER — Encounter: Payer: Self-pay | Admitting: Orthopedic Surgery

## 2020-05-27 NOTE — Progress Notes (Signed)
   Post-Op Visit Note   Patient: Nancy Vasquez           Date of Birth: 03/05/1941           MRN: [id number] Visit Date: 05/21/2020 PCP: Macy Mis, MD   Assessment & Plan:  Chief Complaint:  Chief Complaint  Patient presents with  . Left Knee - Follow-up    04/19/2020 Left TKA   Visit Diagnoses:  1. Status post total left knee replacement     Plan: Patrcia is now about 6 weeks out left total knee replacement.  She is coming along.  Starting outpatient therapy this week.  Ambulating with a cane.  On exam CPM is at 100.  She gets about 90 degrees cold today.  Taking tramadol at night and that is refilled.  Plan at this time is to start outpatient therapy to really increase her flexion.  Come back in 6 weeks for final check.  No calf tenderness negative Homans today.  Follow-Up Instructions: No follow-ups on file.   Orders:  No orders of the defined types were placed in this encounter.  Meds ordered this encounter  Medications  . DISCONTD: traMADol (ULTRAM) 50 MG tablet    Sig: Take 1 tablet (50 mg total) by mouth every 12 (twelve) hours as needed.    Dispense:  30 tablet    Refill:  0    Imaging: No results found.  PMFS History: Patient Active Problem List   Diagnosis Date Noted  . Arthritis of left knee 04/19/2020   Past Medical History:  Diagnosis Date  . Arthritis    has had right hip replacement    Family History  Problem Relation Age of Onset  . Breast cancer Neg Hx     Past Surgical History:  Procedure Laterality Date  . ABDOMINAL HYSTERECTOMY    . BREAST EXCISIONAL BIOPSY Left   . INTRAVASCULAR PRESSURE WIRE/FFR STUDY N/A 03/17/2017   Procedure: Intravascular Pressure Wire/FFR Study;  Surgeon: Rinaldo Cloud, MD;  Location: [facility] INVASIVE CV LAB;  Service: Cardiovascular;  Laterality: N/A;  mid LAD  . LEFT HEART CATH AND CORONARY ANGIOGRAPHY N/A 03/17/2017   Procedure: Left Heart Cath and Coronary Angiography;  Surgeon: Rinaldo Cloud, MD;  Location:  [facility] INVASIVE CV LAB;  Service: Cardiovascular;  Laterality: N/A;  . TONSILLECTOMY     Removed as a child  . 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   Procedure: LEFT TOTAL KNEE ARTHROPLASTY;  Surgeon: Cammy Copa, MD;  Location: [facility] OR;  Service: Orthopedics;  Laterality: Left;   Social History   Occupational History  . Not on file  Tobacco Use  . Smoking status: Never Smoker  . Smokeless tobacco: Never Used  Substance and Sexual Activity  . Alcohol use: Never  . Drug use: Never  . Sexual activity: Not on file

## 2020-05-30 ENCOUNTER — Encounter: Payer: Medicare Other | Admitting: Physical Therapy

## 2020-05-30 ENCOUNTER — Ambulatory Visit: Payer: Medicare Other | Attending: Surgical | Admitting: Physical Therapy

## 2020-05-30 ENCOUNTER — Other Ambulatory Visit: Payer: Self-pay

## 2020-05-30 DIAGNOSIS — M25662 Stiffness of left knee, not elsewhere classified: Secondary | ICD-10-CM | POA: Diagnosis not present

## 2020-05-30 DIAGNOSIS — R2689 Other abnormalities of gait and mobility: Secondary | ICD-10-CM | POA: Insufficient documentation

## 2020-05-30 DIAGNOSIS — M25562 Pain in left knee: Secondary | ICD-10-CM | POA: Insufficient documentation

## 2020-05-30 DIAGNOSIS — R600 Localized edema: Secondary | ICD-10-CM | POA: Insufficient documentation

## 2020-05-30 DIAGNOSIS — M6281 Muscle weakness (generalized): Secondary | ICD-10-CM | POA: Insufficient documentation

## 2020-05-30 NOTE — Therapy (Signed)
[facility]patient [facility] [address] [place], [place], [address] Phone: [phone]   Fax:  [phone]  Physical Therapy Treatment  Patient Details  Name: Nancy Vasquez MRN: [id number] Date of Birth: August 09, 1941 Referring Provider (PT): Dr Keitha Butte   Encounter Date: 05/30/2020  PT End of Session - 05/30/20 1052    Visit Number  2    Number of Visits  12    Date for PT Re-Evaluation  07/04/20    Authorization Type  BC[employer] MCR    Authorization Time Period  pnote 10th visit    PT Start Time  1019    PT Stop Time  1115    PT Time Calculation (min)  56 min       Past Medical History:  Diagnosis Date  . Arthritis    has had right hip replacement    Past Surgical History:  Procedure Laterality Date  . ABDOMINAL HYSTERECTOMY    . BREAST EXCISIONAL BIOPSY Left   . INTRAVASCULAR PRESSURE WIRE/FFR STUDY N/A 03/17/2017   Procedure: Intravascular Pressure Wire/FFR Study;  Surgeon: Rinaldo Cloud, MD;  Location: [facility] INVASIVE CV LAB;  Service: Cardiovascular;  Laterality: N/A;  mid LAD  . LEFT HEART CATH AND CORONARY ANGIOGRAPHY N/A 03/17/2017   Procedure: Left Heart Cath and Coronary Angiography;  Surgeon: Rinaldo Cloud, MD;  Location: [facility] INVASIVE CV LAB;  Service: Cardiovascular;  Laterality: N/A;  . TONSILLECTOMY     Removed as a child  . 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   Procedure: LEFT TOTAL KNEE ARTHROPLASTY;  Surgeon: Cammy Copa, MD;  Location: [facility] OR;  Service: Orthopedics;  Laterality: Left;    There were no vitals filed for this visit.  Subjective Assessment - 05/30/20 1023    Subjective  Pt reports no pain now but has increased pain at night.         [facility] PT Assessment - 05/30/20 0001      AROM   Left Knee Flexion  88                    [facility] Adult PT Treatment/Exercise - 05/30/20 0001      Ambulation/Gait   Gait Comments  gait in clinic with SPC cues for equal step length, tend to reach for  walls and furniture and hs occassional LOB       Knee/Hip Exercises: Stretches   Knee: Self-Stretch Limitations  scoot stretch x 3     Gastroc Stretch Limitations  runners stretch 3 x on left       Knee/Hip Exercises: Aerobic   Stationary Bike  partial revolutions forward and backward  x 3 minutes     Nustep  L3 UE/LE x 3 minutes       Knee/Hip Exercises: Standing   Heel Raises  10 reps    Other Standing Knee Exercises  standing 3 way hip for proprioception on LLE x 10 each way- cues for light touch      Knee/Hip Exercises: Supine   Quad Sets Limitations  long sitting for visual feedback x 20     Heel Slides  10 reps    Heel Slides Limitations  with strap       Vasopneumatic   Number Minutes Vasopneumatic   15 minutes    Vasopnuematic Location   Knee    Vasopneumatic Pressure  Low    Vasopneumatic Temperature   34            PT Education -  05/30/20 1105    Education Details  HEP    Person(s) Educated  Patient    Methods  Explanation;Handout    Comprehension  Verbalized understanding       PT Short Term Goals - 05/23/20 1634      PT SHORT TERM GOAL #1   Title  i with initial HEP for proprioception    Time  3    Period  Weeks    Status  New    Target Date  06/13/20      PT SHORT TERM GOAL #2   Title  improve Lt knee flexion =/> 105 degrees to assist with stairs    Time  3    Period  Weeks    Status  New    Target Date  06/13/20      PT SHORT TERM GOAL #3   Title  walk with normalized gait on even surfaces    Time  3    Period  Weeks    Status  New    Target Date  06/13/20        PT Long Term Goals - 05/23/20 1635      PT LONG TERM GOAL #1   Title  i with advanced HEP to include return to gym    Time  6    Period  Weeks    Status  New    Target Date  07/04/20      PT LONG TERM GOAL #2   Title  improve Lt knee ext to flexion 0-120 to allow her to get on off the floor    Time  6    Period  Weeks    Status  New    Target Date  07/04/20       PT LONG TERM GOAL #3   Title  improve Lt LE strength =/> 5-/5 to allow her to alternate steps on her stairs    Time  6    Period  Weeks    Status  New    Target Date  07/04/20      PT LONG TERM GOAL #4   Title  improve FOTO =/< 29% limited    Time  6    Period  Weeks    Status  New    Target Date  07/04/20      PT LONG TERM GOAL #5   Title  report no more than 2/10 Lt knee pain at night to allow her to sleep per her previous level    Time  6    Period  Weeks    Status  New    Target Date  07/04/20            Plan - 05/30/20 1022    Clinical Impression Statement  Pt reports the knee is feeling a lot better. She walked at the park a short distance with rest breaks. She is doing her HEP. Gait into clinic with [facility] and she is reaching for wall and handrails to steady her balance. Gait training with cues to equalize step length was helpful with gait. Performed standing 3 way hip with weight bearing into LLE. She is currently doing this as a HHPT exercise but has not put weight on LLE. Reviewed initil HEP. Her ROM is the same for knee flexion. Repeated VASO for pain and edema. She reports this was helpful last session.    PT Next Visit Plan  balance, gait, ROM, strength , progress HEP prn  PT Home Exercise Plan  heel slides with strap, seated scoot stretch, long sitting quad set.       Patient will benefit from skilled therapeutic intervention in order to improve the following deficits and impairments:  Abnormal gait, Decreased range of motion, Pain, Decreased strength, Decreased mobility, Decreased scar mobility, Increased muscle spasms  Visit Diagnosis: Stiffness of left knee, not elsewhere classified  Acute pain of left knee  Muscle weakness (generalized)  Other abnormalities of gait and mobility  Localized edema     Problem List Patient Active Problem List   Diagnosis Date Noted  . Arthritis of left knee 04/19/2020    Dorene Ar, PTA 05/30/2020, 11:13  AM  [place], [place], [address] Phone: [phone]   Fax:  [phone]  Name: KINSLEIGH LUDOLPH MRN: [id number] Date of Birth: 1941-06-14

## 2020-05-30 NOTE — Patient Instructions (Signed)
Access Code: [email or web address] URL: [email or web address] Date: 05/30/2020 Prepared by: Jannette Spanner  Exercises Seated Knee Flexion Stretch - 1 x daily - 7 x weekly - 1 sets - 3 reps - 30 hold Long Sitting Quad Set - 1 x daily - 7 x weekly - 3 sets - 10 reps - 5 hold Supine Heel Slide with Strap - 1 x daily - 7 x weekly - 2 sets - 10 reps - 5 hold

## 2020-06-01 ENCOUNTER — Encounter: Payer: Medicare Other | Admitting: Physical Therapy

## 2020-06-01 ENCOUNTER — Ambulatory Visit: Payer: Medicare Other | Admitting: Physical Therapy

## 2020-06-01 ENCOUNTER — Encounter: Payer: Self-pay | Admitting: Physical Therapy

## 2020-06-01 ENCOUNTER — Other Ambulatory Visit: Payer: Self-pay

## 2020-06-01 DIAGNOSIS — R2689 Other abnormalities of gait and mobility: Secondary | ICD-10-CM

## 2020-06-01 DIAGNOSIS — M25662 Stiffness of left knee, not elsewhere classified: Secondary | ICD-10-CM

## 2020-06-01 DIAGNOSIS — R600 Localized edema: Secondary | ICD-10-CM

## 2020-06-01 DIAGNOSIS — M6281 Muscle weakness (generalized): Secondary | ICD-10-CM

## 2020-06-01 DIAGNOSIS — M25562 Pain in left knee: Secondary | ICD-10-CM

## 2020-06-01 NOTE — Therapy (Signed)
[place], [place], [address] Phone: [phone]   Fax:  [phone]  Physical Therapy Treatment  Patient Details  Name: Nancy Vasquez MRN: [id number] Date of Birth: December 11, 1941 Referring Provider (PT): Dr Mittie Bodo   Encounter Date: 06/01/2020  PT End of Session - 06/01/20 1041    Visit Number  3    Number of Visits  12    Date for PT Re-Evaluation  07/04/20    Authorization Type  BCBS MCR    Authorization Time Period  pnote 10th visit    PT Start Time  1019    PT Stop Time  1113    PT Time Calculation (min)  54 min       Past Medical History:  Diagnosis Date  . Arthritis    has had right hip replacement    Past Surgical History:  Procedure Laterality Date  . ABDOMINAL HYSTERECTOMY    . BREAST EXCISIONAL BIOPSY Left   . INTRAVASCULAR PRESSURE WIRE/FFR STUDY N/A 03/17/2017   Procedure: Intravascular Pressure Wire/FFR Study;  Surgeon: Charolette Forward, MD;  Location: [place] CV LAB;  Service: Cardiovascular;  Laterality: N/A;  mid LAD  . LEFT HEART CATH AND CORONARY ANGIOGRAPHY N/A 03/17/2017   Procedure: Left Heart Cath and Coronary Angiography;  Surgeon: Charolette Forward, MD;  Location: [place] CV LAB;  Service: Cardiovascular;  Laterality: N/A;  . TONSILLECTOMY     Removed as a child  . 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   Procedure: LEFT TOTAL KNEE ARTHROPLASTY;  Surgeon: Meredith Pel, MD;  Location: [place];  Service: Orthopedics;  Laterality: Left;    There were no vitals filed for this visit.      [facility] PT Assessment - 06/01/20 0001      AROM   Left Knee Flexion  95   AAROM                   [facility] Adult PT Treatment/Exercise - 06/01/20 0001      Knee/Hip Exercises: Stretches   Knee: Self-Stretch Limitations  scoot stretch x 3     Gastroc Stretch Limitations  runners stretch 3 x on left       Knee/Hip Exercises: Aerobic   Stationary Bike  full   revolutions forward and backward  x 5 minutes     Nustep  L5 Ue/LE x 6 minutes       Knee/Hip Exercises: Standing   Heel Raises  10 reps    Functional Squat  10 reps    Functional Squat Limitations  mini squats at sink     Other Standing Knee Exercises  standing 3 way hip for proprioception on LLE x 10x 2  each way- cues for light touch      Knee/Hip Exercises: Supine   Heel Slides  10 reps    Heel Slides Limitations  with strap       Vasopneumatic   Number Minutes Vasopneumatic   15 minutes    Vasopnuematic Location   Knee    Vasopneumatic Pressure  Low    Vasopneumatic Temperature   34      Manual Therapy   Manual therapy comments  patell amobilizations , Knee flexion PROM                PT Short Term Goals - 05/23/20 1634      PT SHORT TERM GOAL #1   Title  i with initial HEP for proprioception  Time  3    Period  Weeks    Status  New    Target Date  06/13/20      PT SHORT TERM GOAL #2   Title  improve Lt knee flexion =/> 105 degrees to assist with stairs    Time  3    Period  Weeks    Status  New    Target Date  06/13/20      PT SHORT TERM GOAL #3   Title  walk with normalized gait on even surfaces    Time  3    Period  Weeks    Status  New    Target Date  06/13/20        PT Long Term Goals - 05/23/20 1635      PT LONG TERM GOAL #1   Title  i with advanced HEP to include return to gym    Time  6    Period  Weeks    Status  New    Target Date  07/04/20      PT LONG TERM GOAL #2   Title  improve Lt knee ext to flexion 0-120 to allow her to get on off the floor    Time  6    Period  Weeks    Status  New    Target Date  07/04/20      PT LONG TERM GOAL #3   Title  improve Lt LE strength =/> 5-/5 to allow her to alternate steps on her stairs    Time  6    Period  Weeks    Status  New    Target Date  07/04/20      PT LONG TERM GOAL #4   Title  improve FOTO =/< 29% limited    Time  6    Period  Weeks    Status  New    Target Date   07/04/20      PT LONG TERM GOAL #5   Title  report no more than 2/10 Lt knee pain at night to allow her to sleep per her previous level    Time  6    Period  Weeks    Status  New    Target Date  07/04/20            Plan - 06/01/20 1041    Clinical Impression Statement  Improved tolerance to revolutions on recumbent bike today. AAROM measures 98 degrees. COntinued with Le strength and proprioception. Decreased antalgic pattern today with SPC in clinic.    PT Next Visit Plan  balance, gait, ROM, strength , progress HEP prn    PT Home Exercise Plan  heel slides with strap, seated scoot stretch, long sitting quad set.       Patient will benefit from skilled therapeutic intervention in order to improve the following deficits and impairments:  Abnormal gait, Decreased range of motion, Pain, Decreased strength, Decreased mobility, Decreased scar mobility, Increased muscle spasms  Visit Diagnosis: Stiffness of left knee, not elsewhere classified  Acute pain of left knee  Muscle weakness (generalized)  Other abnormalities of gait and mobility  Localized edema     Problem List Patient Active Problem List   Diagnosis Date Noted  . Arthritis of left knee 04/19/2020    Sherrie Mustache, PTA 06/01/2020, 11:02 AM  [facility] [address] [place], [place], [address] Phone: [phone]   Fax:  [phone]  Name:  Nancy Vasquez MRN: [id number] Date of Birth: 1941-06-10

## 2020-06-05 ENCOUNTER — Encounter: Payer: Self-pay | Admitting: Physical Therapy

## 2020-06-05 ENCOUNTER — Other Ambulatory Visit: Payer: Self-pay

## 2020-06-05 ENCOUNTER — Ambulatory Visit: Payer: Medicare Other | Admitting: Physical Therapy

## 2020-06-05 DIAGNOSIS — M6281 Muscle weakness (generalized): Secondary | ICD-10-CM

## 2020-06-05 DIAGNOSIS — M25662 Stiffness of left knee, not elsewhere classified: Secondary | ICD-10-CM

## 2020-06-05 DIAGNOSIS — M25562 Pain in left knee: Secondary | ICD-10-CM

## 2020-06-05 DIAGNOSIS — R2689 Other abnormalities of gait and mobility: Secondary | ICD-10-CM

## 2020-06-05 DIAGNOSIS — R600 Localized edema: Secondary | ICD-10-CM

## 2020-06-05 NOTE — Therapy (Signed)
[facility] Outpatient Rehabilitation [facility] [address] [place], [place], [address] Phone: [phone]   Fax:  [phone]  Physical Therapy Treatment  Patient Details  Name: Nancy Vasquez MRN: [id number] Date of Birth: 1941/04/17 Referring Provider (PT): Dr Keitha Butte   Encounter Date: 06/05/2020  PT End of Session - 06/05/20 1220    Visit Number  4    Number of Visits  12    Date for PT Re-Evaluation  07/04/20    Authorization Type  BC[employer] MCR    Authorization Time Period  pnote 10th visit    PT Start Time  1154    PT Stop Time  1250    PT Time Calculation (min)  56 min    Activity Tolerance  Patient tolerated treatment well    Behavior During Therapy  [facility] for tasks assessed/performed       Past Medical History:  Diagnosis Date  . Arthritis    has had right hip replacement    Past Surgical History:  Procedure Laterality Date  . ABDOMINAL HYSTERECTOMY    . BREAST EXCISIONAL BIOPSY Left   . INTRAVASCULAR PRESSURE WIRE/FFR STUDY N/A 03/17/2017   Procedure: Intravascular Pressure Wire/FFR Study;  Surgeon: Rinaldo Cloud, MD;  Location: [facility] INVASIVE CV LAB;  Service: Cardiovascular;  Laterality: N/A;  mid LAD  . LEFT HEART CATH AND CORONARY ANGIOGRAPHY N/A 03/17/2017   Procedure: Left Heart Cath and Coronary Angiography;  Surgeon: Rinaldo Cloud, MD;  Location: [facility] INVASIVE CV LAB;  Service: Cardiovascular;  Laterality: N/A;  . TONSILLECTOMY     Removed as a child  . 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   Procedure: LEFT TOTAL KNEE ARTHROPLASTY;  Surgeon: Cammy Copa, MD;  Location: [facility] OR;  Service: Orthopedics;  Laterality: Left;    There were no vitals filed for this visit.  Subjective Assessment - 06/05/20 1158    Subjective  Patient continues to have pain at night. She has been taking tramadol so she can sleep. She has been working on her exercises.    Patient Stated Goals  bend her Lt knee and use it    Currently in Pain?  Yes    Pain  Score  1     Pain Location  Knee    Pain Orientation  Left    Pain Descriptors / Indicators  Aching    Pain Type  Surgical pain    Pain Onset  1 to 4 weeks ago    Pain Frequency  Intermittent    Aggravating Factors   sleeping and prolonged sitting         [facility] PT Assessment - 06/05/20 0001      AROM   Left Knee Flexion  100                    [facility] Adult PT Treatment/Exercise - 06/05/20 0001      Knee/Hip Exercises: Aerobic   Nustep  L5 Ue/LE x 6 minutes       Knee/Hip Exercises: Standing   Heel Raises  10 reps    Hip Flexion Limitations  standing march 2x10     Functional Squat  10 reps    Other Standing Knee Exercises  standing 3 way hip for proprioception on LLE x 10x 2  each way- cues for light touch      Knee/Hip Exercises: Supine   Quad Sets Limitations  x10 5 sec hold     Short Arc Quad Sets Limitations  x15  Heel Slides  10 reps    Heel Slides Limitations  with strap       Vasopneumatic   Number Minutes Vasopneumatic   15 minutes    Vasopnuematic Location   Knee    Vasopneumatic Pressure  Low    Vasopneumatic Temperature   34      Manual Therapy   Manual Therapy  Joint mobilization    Manual therapy comments  patell mobilizations , Knee flexion PROM     Joint Mobilization  Grade II and III PA and AP glides to improve flexion              PT Education - 06/05/20 1219    Education Details  reviewed exercises and pain management at night    Person(s) Educated  Patient    Methods  Explanation;Demonstration;Tactile cues;Verbal cues    Comprehension  Verbalized understanding;Returned demonstration;Verbal cues required;Tactile cues required       PT Short Term Goals - 05/23/20 1634      PT SHORT TERM GOAL #1   Title  i with initial HEP for proprioception    Time  3    Period  Weeks    Status  New    Target Date  06/13/20      PT SHORT TERM GOAL #2   Title  improve Lt knee flexion =/> 105 degrees to assist with stairs    Time   3    Period  Weeks    Status  New    Target Date  06/13/20      PT SHORT TERM GOAL #3   Title  walk with normalized gait on even surfaces    Time  3    Period  Weeks    Status  New    Target Date  06/13/20        PT Long Term Goals - 05/23/20 1635      PT LONG TERM GOAL #1   Title  i with advanced HEP to include return to gym    Time  6    Period  Weeks    Status  New    Target Date  07/04/20      PT LONG TERM GOAL #2   Title  improve Lt knee ext to flexion 0-120 to allow her to get on off the floor    Time  6    Period  Weeks    Status  New    Target Date  07/04/20      PT LONG TERM GOAL #3   Title  improve Lt LE strength =/> 5-/5 to allow her to alternate steps on her stairs    Time  6    Period  Weeks    Status  New    Target Date  07/04/20      PT LONG TERM GOAL #4   Title  improve FOTO =/< 29% limited    Time  6    Period  Weeks    Status  New    Target Date  07/04/20      PT LONG TERM GOAL #5   Title  report no more than 2/10 Lt knee pain at night to allow her to sleep per her previous level    Time  6    Period  Weeks    Status  New    Target Date  07/04/20            Plan - 06/05/20 1221  Clinical Impression Statement  Patients PROm measured at 100 degrees today. She handeld ther-ex well. She continues to have pain with end range flexion byut it is improving. Patient had no significant increase in pain with treatment. Therapy will continue to progress as tolerated.    Personal Factors and Comorbidities  Comorbidity 1    Comorbidities  arthirtis    Examination-Activity Limitations  Bathing;Locomotion Level;Stairs    Examination-Participation Restrictions  Community Activity;Other    Stability/Clinical Decision Making  Stable/Uncomplicated    Clinical Decision Making  Low    Rehab Potential  Good    PT Frequency  2x / week    PT Duration  6 weeks    PT Treatment/Interventions  ADLs/Self Care Home Management;Electrical  Stimulation;Iontophoresis 4mg /ml Dexamethasone;Ultrasound;DME Instruction;Gait training;Functional mobility training;Therapeutic exercise;Therapeutic activities;Stair training;Neuromuscular re-education;Patient/family education;Passive range of motion;Taping    PT Next Visit Plan  balance, gait, ROM, strength , progress HEP prn    PT Home Exercise Plan  heel slides with strap, seated scoot stretch, long sitting quad set.    Consulted and Agree with Plan of Care  Patient       Patient will benefit from skilled therapeutic intervention in order to improve the following deficits and impairments:  Abnormal gait, Decreased range of motion, Pain, Decreased strength, Decreased mobility, Decreased scar mobility, Increased muscle spasms  Visit Diagnosis: Stiffness of left knee, not elsewhere classified  Acute pain of left knee  Muscle weakness (generalized)  Other abnormalities of gait and mobility  Localized edema     Problem List Patient Active Problem List   Diagnosis Date Noted  . Arthritis of left knee 04/19/2020    04/21/2020 PT DPT  06/05/2020, 3:43 PM  [facility] [address] [place], [place], [place] Phone: [phone]   Fax:  [phone]  Name: Nancy Vasquez MRN: [name] Date of Birth: 02/23/1941

## 2020-06-07 ENCOUNTER — Other Ambulatory Visit: Payer: Self-pay

## 2020-06-07 ENCOUNTER — Ambulatory Visit: Payer: Medicare Other | Admitting: Physical Therapy

## 2020-06-07 ENCOUNTER — Encounter: Payer: Self-pay | Admitting: Physical Therapy

## 2020-06-07 DIAGNOSIS — R2689 Other abnormalities of gait and mobility: Secondary | ICD-10-CM

## 2020-06-07 DIAGNOSIS — M6281 Muscle weakness (generalized): Secondary | ICD-10-CM

## 2020-06-07 DIAGNOSIS — R600 Localized edema: Secondary | ICD-10-CM

## 2020-06-07 DIAGNOSIS — M25562 Pain in left knee: Secondary | ICD-10-CM

## 2020-06-07 DIAGNOSIS — M25662 Stiffness of left knee, not elsewhere classified: Secondary | ICD-10-CM | POA: Diagnosis not present

## 2020-06-07 NOTE — Therapy (Signed)
[facility] [address] [place], [place], [address] Phone: [phone]   Fax:  [phone]  Physical Therapy Treatment  Patient Details  Name: Nancy Vasquez MRN: [id number] Date of Birth: January 11, 1941 Referring Provider (PT): Dr Keitha Butte   Encounter Date: 06/07/2020   PT End of Session - 06/07/20 1144    Visit Number 5    Number of Visits 12    Date for PT Re-Evaluation 07/04/20    Authorization Type BCBS MCR    Authorization Time Period pnote 10th visit    PT Start Time 1100    PT Stop Time 1158    PT Time Calculation (min) 58 min    Activity Tolerance Patient tolerated treatment well    Behavior During Therapy [facility] for tasks assessed/performed           Past Medical History:  Diagnosis Date  . Arthritis    has had right hip replacement    Past Surgical History:  Procedure Laterality Date  . ABDOMINAL HYSTERECTOMY    . BREAST EXCISIONAL BIOPSY Left   . INTRAVASCULAR PRESSURE WIRE/FFR STUDY N/A 03/17/2017   Procedure: Intravascular Pressure Wire/FFR Study;  Surgeon: Rinaldo Cloud, MD;  Location: [facility] INVASIVE CV LAB;  Service: Cardiovascular;  Laterality: N/A;  mid LAD  . LEFT HEART CATH AND CORONARY ANGIOGRAPHY N/A 03/17/2017   Procedure: Left Heart Cath and Coronary Angiography;  Surgeon: Rinaldo Cloud, MD;  Location: [facility] INVASIVE CV LAB;  Service: Cardiovascular;  Laterality: N/A;  . TONSILLECTOMY     Removed as a child  . 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   Procedure: LEFT TOTAL KNEE ARTHROPLASTY;  Surgeon: Cammy Copa, MD;  Location: [facility] OR;  Service: Orthopedics;  Laterality: Left;    There were no vitals filed for this visit.   Subjective Assessment - 06/07/20 1103    Subjective Pt. noting some lateral knee knee soreness this AM and continues with discomfort at night.    Currently in Pain? Yes    Pain Score 2     Pain Location Knee    Pain Orientation Left    Pain Descriptors / Indicators  Aching    Pain Type Surgical pain    Pain Onset More than a month ago    Pain Frequency Intermittent    Aggravating Factors  sleeping and prolonged sitting    Pain Relieving Factors gentle walking                             [facility] Adult PT Treatment/Exercise - 06/07/20 0001      Knee/Hip Exercises: Aerobic   Nustep L5 Ue/LE x 6 minutes       Knee/Hip Exercises: Standing   Heel Raises Both;20 reps    Heel Raises Limitations Airex    Hip Flexion Limitations Airex marches x 20 reps    Lateral Step Up Left;1 set;15 reps;Hand Hold: 2;Step Height: 4"    Forward Step Up Left;1 set;15 reps;Hand Hold: 2;Step Height: 4"    Functional Squat 15 reps    Functional Squat Limitations partial squat at counnter x 15 reps    Other Standing Knee Exercises standing 3 way hip for proprioception on LLE x 10x 2  each way- cues for light touch      Knee/Hip Exercises: Seated   Long Arc Quad AROM;Strengthening;Left;2 sets;10 reps    Long Arc Quad Weight 2 lbs.      Knee/Hip Exercises: Supine  Quad Sets Limitations 10 x 5 sec holds    Short Arc [employer] Limitations 15 reps    Heel Slides 10 reps    Straight Leg Raises AROM;Left;10 reps    Straight Leg Raises Limitations mild quad lag      Vasopneumatic   Number Minutes Vasopneumatic  15 minutes    Vasopnuematic Location  Knee    Vasopneumatic Pressure Low    Vasopneumatic Temperature  34      Manual Therapy   Manual Therapy Passive ROM    Manual therapy comments patell mobilizations , Knee flexion PROM     Passive ROM left knee flexion in sitting with slight distraction and tibial IR                    PT Short Term Goals - 05/23/20 1634      PT SHORT TERM GOAL #1   Title i with initial HEP for proprioception    Time 3    Period Weeks    Status New    Target Date 06/13/20      PT SHORT TERM GOAL #2   Title improve Lt knee flexion =/> 105 degrees to assist with stairs    Time 3    Period Weeks    Status  New    Target Date 06/13/20      PT SHORT TERM GOAL #3   Title walk with normalized gait on even surfaces    Time 3    Period Weeks    Status New    Target Date 06/13/20             PT Long Term Goals - 05/23/20 1635      PT LONG TERM GOAL #1   Title i with advanced HEP to include return to gym    Time 6    Period Weeks    Status New    Target Date 07/04/20      PT LONG TERM GOAL #2   Title improve Lt knee ext to flexion 0-120 to allow her to get on off the floor    Time 6    Period Weeks    Status New    Target Date 07/04/20      PT LONG TERM GOAL #3   Title improve Lt LE strength =/> 5-/5 to allow her to alternate steps on her stairs    Time 6    Period Weeks    Status New    Target Date 07/04/20      PT LONG TERM GOAL #4   Title improve FOTO =/< 29% limited    Time 6    Period Weeks    Status New    Target Date 07/04/20      PT LONG TERM GOAL #5   Title report no more than 2/10 Lt knee pain at night to allow her to sleep per her previous level    Time 6    Period Weeks    Status New    Target Date 07/04/20                 Plan - 06/07/20 1144    Clinical Impression Statement Progressed standing exercises for closed chain strengthening with 4 in. step up-well-tolerate but quad weakness evident with eccentric difficulty for step down portion of motion. Otherwise continued focus knee flexion ROM with fair tolerance-still stiff but making gradual improvements from baseline measures. Pt. would benefit from continued PT for  further progress to address strength and ROM limitations to improve functional status for mobility.    Personal Factors and Comorbidities Comorbidity 1    Comorbidities arthritis    Examination-Activity Limitations Bathing;Locomotion Level;Stairs    Examination-Participation Restrictions Community Activity;Other    Stability/Clinical Decision Making Stable/Uncomplicated    Clinical Decision Making Low    Rehab Potential Good    PT  Frequency 2x / week    PT Duration 6 weeks    PT Treatment/Interventions ADLs/Self Care Home Management;Electrical Stimulation;Iontophoresis 4mg /ml Dexamethasone;Ultrasound;DME Instruction;Gait training;Functional mobility training;Therapeutic exercise;Therapeutic activities;Stair training;Neuromuscular re-education;Patient/family education;Passive range of motion;Taping    PT Next Visit Plan balance, gait, ROM, strength , progress HEP prn    PT Home Exercise Plan heel slides with strap, seated scoot stretch, long sitting quad set.    Consulted and Agree with Plan of Care Patient           Patient will benefit from skilled therapeutic intervention in order to improve the following deficits and impairments:  Abnormal gait, Decreased range of motion, Pain, Decreased strength, Decreased mobility, Decreased scar mobility, Increased muscle spasms  Visit Diagnosis: Stiffness of left knee, not elsewhere classified  Acute pain of left knee  Localized edema  Muscle weakness (generalized)  Other abnormalities of gait and mobility     Problem List Patient Active Problem List   Diagnosis Date Noted  . Arthritis of left knee 04/19/2020    Beaulah Dinning, PT, DPT 06/07/20 11:47 AM  [facility] [address] [place], [place], [address] Phone: [phone]   Fax:  [phone]  Name: Nancy Vasquez MRN: [id number] Date of Birth: July 31, 1941

## 2020-06-12 ENCOUNTER — Other Ambulatory Visit: Payer: Self-pay

## 2020-06-12 ENCOUNTER — Encounter: Payer: Self-pay | Admitting: Physical Therapy

## 2020-06-12 ENCOUNTER — Ambulatory Visit: Payer: Medicare Other | Admitting: Physical Therapy

## 2020-06-12 DIAGNOSIS — M25662 Stiffness of left knee, not elsewhere classified: Secondary | ICD-10-CM

## 2020-06-12 DIAGNOSIS — R2689 Other abnormalities of gait and mobility: Secondary | ICD-10-CM

## 2020-06-12 DIAGNOSIS — M6281 Muscle weakness (generalized): Secondary | ICD-10-CM

## 2020-06-12 DIAGNOSIS — R600 Localized edema: Secondary | ICD-10-CM

## 2020-06-12 DIAGNOSIS — M25562 Pain in left knee: Secondary | ICD-10-CM

## 2020-06-12 NOTE — Therapy (Signed)
[place], [place], [address] Phone: [phone]   Fax:  [phone]  Physical Therapy Treatment  Patient Details  Name: Nancy Vasquez MRN: [id number] Date of Birth: 07-12-1941 Referring Provider (PT): Dr Mittie Bodo   Encounter Date: 06/12/2020   PT End of Session - 06/12/20 1022    Visit Number 6    Number of Visits 12    Date for PT Re-Evaluation 07/04/20    Authorization Type BCBS MCR    Authorization Time Period pnote 10th visit    PT Start Time 1015    PT Stop Time 1113    PT Time Calculation (min) 58 min    Activity Tolerance Patient tolerated treatment well    Behavior During Therapy [facility] for tasks assessed/performed           Past Medical History:  Diagnosis Date  . Arthritis    has had right hip replacement    Past Surgical History:  Procedure Laterality Date  . ABDOMINAL HYSTERECTOMY    . BREAST EXCISIONAL BIOPSY Left   . INTRAVASCULAR PRESSURE WIRE/FFR STUDY N/A 03/17/2017   Procedure: Intravascular Pressure Wire/FFR Study;  Surgeon: Charolette Forward, MD;  Location: [place] CV LAB;  Service: Cardiovascular;  Laterality: N/A;  mid LAD  . LEFT HEART CATH AND CORONARY ANGIOGRAPHY N/A 03/17/2017   Procedure: Left Heart Cath and Coronary Angiography;  Surgeon: Charolette Forward, MD;  Location: [place] CV LAB;  Service: Cardiovascular;  Laterality: N/A;  . TONSILLECTOMY     Removed as a child  . 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   Procedure: LEFT TOTAL KNEE ARTHROPLASTY;  Surgeon: Meredith Pel, MD;  Location: [place];  Service: Orthopedics;  Laterality: Left;    There were no vitals filed for this visit.   Subjective Assessment - 06/12/20 1021    Subjective Pt. reports feels fine after exercises/PT but tendency soreness later on. She continues with cane use.    Currently in Pain? Yes    Pain Score 3     Pain Location Knee    Pain Orientation Left    Pain Descriptors /  Indicators Aching    Pain Type Surgical pain    Pain Onset More than a month ago    Pain Frequency Intermittent    Aggravating Factors  sleeping and prolonged sitting    Pain Relieving Factors gentle walking              [facility] PT Assessment - 06/12/20 0001      Observation/Other Assessments   Focus on Therapeutic Outcomes (FOTO)  22% limited      Strength   Right/Left Knee Left    Left Knee Flexion 4+/5    Left Knee Extension 4/5                         [facility] Adult PT Treatment/Exercise - 06/12/20 0001      Knee/Hip Exercises: Stretches   Other Knee/Hip Stretches step stretch left knee flexion 10 x 5-10 sec holds    Other Knee/Hip Stretches reviewed seated self stretch left knee flexion with right leg assist 5-10 sec holds x 5 reps      Knee/Hip Exercises: Aerobic   Nustep L5 x 5 min UE/LE      Knee/Hip Exercises: Standing   Heel Raises Both;20 reps    Heel Raises Limitations Airex    Lateral Step Up Left;2 sets;10 reps;Hand Hold: 2;Step Height:  4"    Forward Step Up Left;2 sets;10 reps;Hand Hold: 2;Step Height: 6"    Functional Squat 20 reps    Functional Squat Limitations partial squat at counnter x 15 reps    Rocker Board 2 minutes    Rocker Board Limitations 1 min ea. dynamic balance lateral and fw/rev    SLS 5 x 5-10 sec holds left side    Other Standing Knee Exercises Romberg eyes closed 20 sec x 3 at counter      Vasopneumatic   Number Minutes Vasopneumatic  15 minutes    Vasopnuematic Location  Knee    Vasopneumatic Pressure Low    Vasopneumatic Temperature  --   started at 34 but too cold per pt. so increased to 36     Manual Therapy   Passive ROM left knee flexion in sitting with slight distraction and tibial IR                  PT Education - 06/12/20 1325    Education Details HEP updates, POC    Person(s) Educated Patient    Methods Explanation;Demonstration;Verbal cues    Comprehension Verbalized understanding;Returned  demonstration            PT Short Term Goals - 06/12/20 1022      PT SHORT TERM GOAL #1   Title i with initial HEP for proprioception    Baseline reviewed today    Time 3    Period Weeks    Status Achieved    Target Date 06/13/20      PT SHORT TERM GOAL #2   Title improve Lt knee flexion =/> 105 degrees to assist with stairs    Baseline 100    Time 3    Period Weeks    Status On-going      PT SHORT TERM GOAL #3   Title walk with normalized gait on even surfaces    Baseline still some antalgia, requiring SPC use, goal ongoing    Time 3    Period Weeks    Status On-going             PT Long Term Goals - 06/12/20 1024      PT LONG TERM GOAL #1   Title i with advanced HEP to include return to gym    Baseline updates ongoing    Time 6    Period Weeks      PT LONG TERM GOAL #2   Title improve Lt knee ext to flexion 0-120 to allow her to get on off the floor    Baseline ongoing, 2-100 deg    Time 6    Period Weeks    Status On-going      PT LONG TERM GOAL #3   Title improve Lt LE strength =/> 5-/5 to allow her to alternate steps on her stairs    Baseline left knee ext 4/5, 4+/5 flexion    Time 6    Period Weeks    Status On-going      PT LONG TERM GOAL #4   Title improve FOTO =/< 29% limited    Baseline 22% limited    Time 6    Period Weeks    Status Achieved      PT LONG TERM GOAL #5   Title report no more than 2/10 Lt knee pain at night to allow her to sleep per her previous level    Baseline painimproving but still >2/10    Time  6    Period Weeks    Status On-going                 Plan - 06/12/20 1239    Clinical Impression Statement Continued closed-chain exercise progression and updated HEP for squats, step ups and balance/proprioceptive challenges. Left knee stiffness still limiting ability for activities such as stair navigation and still with gait/balance deficits with continued need AD. Functionally progressing as evidenced by FOTO  score but would benefit from continued PT for further progress for remaining LTGs for therapy.    Personal Factors and Comorbidities Comorbidity 1    Comorbidities arthritis    Examination-Activity Limitations Bathing;Locomotion Level;Stairs    Examination-Participation Restrictions Community Activity;Other    Stability/Clinical Decision Making Stable/Uncomplicated    Clinical Decision Making Low    Rehab Potential Good    PT Frequency 2x / week    PT Duration 6 weeks    PT Treatment/Interventions ADLs/Self Care Home Management;Electrical Stimulation;Iontophoresis 4mg /ml Dexamethasone;Ultrasound;DME Instruction;Gait training;Functional mobility training;Therapeutic exercise;Therapeutic activities;Stair training;Neuromuscular re-education;Patient/family education;Passive range of motion;Taping    PT Next Visit Plan balance, gait, ROM, strength , progress HEP prn    PT Home Exercise Plan heel slides with strap, seated scoot stretch, long sitting quad set, squat at counter, step ups, step stretch for knee flexion vs. seated knee flexion with RLE assist, Romberg and SLS at counter    Consulted and Agree with Plan of Care Patient           Patient will benefit from skilled therapeutic intervention in order to improve the following deficits and impairments:  Abnormal gait, Decreased range of motion, Pain, Decreased strength, Decreased mobility, Decreased scar mobility, Increased muscle spasms  Visit Diagnosis: Acute pain of left knee  Stiffness of left knee, not elsewhere classified  Localized edema  Muscle weakness (generalized)  Other abnormalities of gait and mobility     Problem List Patient Active Problem List   Diagnosis Date Noted  . Arthritis of left knee 04/19/2020    04/21/2020, PT, DPT 06/12/20 1:30 PM  [facility] [address] [place], [place], [place] Phone: [phone]   Fax:  [phone]  Name:  Nancy Vasquez MRN: [name] Date of Birth: Jan 03, 1941

## 2020-06-14 ENCOUNTER — Other Ambulatory Visit: Payer: Self-pay

## 2020-06-14 ENCOUNTER — Encounter: Payer: Self-pay | Admitting: Physical Therapy

## 2020-06-14 ENCOUNTER — Ambulatory Visit: Payer: Medicare Other | Admitting: Physical Therapy

## 2020-06-14 DIAGNOSIS — M25662 Stiffness of left knee, not elsewhere classified: Secondary | ICD-10-CM | POA: Diagnosis not present

## 2020-06-14 DIAGNOSIS — M25562 Pain in left knee: Secondary | ICD-10-CM

## 2020-06-14 DIAGNOSIS — M6281 Muscle weakness (generalized): Secondary | ICD-10-CM

## 2020-06-14 DIAGNOSIS — R2689 Other abnormalities of gait and mobility: Secondary | ICD-10-CM

## 2020-06-14 DIAGNOSIS — R600 Localized edema: Secondary | ICD-10-CM

## 2020-06-14 NOTE — Therapy (Signed)
[place], [place], [address] Phone: [phone]   Fax:  [phone]  Physical Therapy Treatment  Patient Details  Name: Nancy Vasquez MRN: [id number] Date of Birth: 1941/08/05 Referring Provider (PT): Dr Mittie Bodo   Encounter Date: 06/14/2020   PT End of Session - 06/14/20 1025    Visit Number 7    Number of Visits 12    Date for PT Re-Evaluation 07/04/20    Authorization Type BCBS MCR    PT Start Time 1015    PT Stop Time 1113    PT Time Calculation (min) 58 min    Activity Tolerance Patient tolerated treatment well    Behavior During Therapy [facility] for tasks assessed/performed           Past Medical History:  Diagnosis Date  . Arthritis    has had right hip replacement    Past Surgical History:  Procedure Laterality Date  . ABDOMINAL HYSTERECTOMY    . BREAST EXCISIONAL BIOPSY Left   . INTRAVASCULAR PRESSURE WIRE/FFR STUDY N/A 03/17/2017   Procedure: Intravascular Pressure Wire/FFR Study;  Surgeon: Charolette Forward, MD;  Location: [place] CV LAB;  Service: Cardiovascular;  Laterality: N/A;  mid LAD  . LEFT HEART CATH AND CORONARY ANGIOGRAPHY N/A 03/17/2017   Procedure: Left Heart Cath and Coronary Angiography;  Surgeon: Charolette Forward, MD;  Location: [place] CV LAB;  Service: Cardiovascular;  Laterality: N/A;  . TONSILLECTOMY     Removed as a child  . 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   Procedure: LEFT TOTAL KNEE ARTHROPLASTY;  Surgeon: Meredith Pel, MD;  Location: [place];  Service: Orthopedics;  Laterality: Left;    There were no vitals filed for this visit.   Subjective Assessment - 06/14/20 1024    Subjective Patient reports continues swelling in her knee. She reports tightness but not so much pain. She had some difficulty sleeping last night.    Patient Stated Goals bend her Lt knee and use it    Currently in Pain? Yes    Pain Score 3     Pain Location Knee    Pain  Orientation Left    Pain Descriptors / Indicators Aching    Pain Type Chronic pain    Pain Onset More than a month ago    Pain Frequency Intermittent    Aggravating Factors  sleeping and prolonged positions    Pain Relieving Factors tylenol                             [facility] Adult PT Treatment/Exercise - 06/14/20 0001      Knee/Hip Exercises: Aerobic   Nustep L5 x 5 min UE/LE      Knee/Hip Exercises: Standing   Heel Raises Both;20 reps    Heel Raises Limitations Airex    Lateral Step Up Left;2 sets;10 reps;Hand Hold: 2;Step Height: 4"    Forward Step Up Left;2 sets;10 reps;Hand Hold: 2;Step Height: 6"    Functional Squat 20 reps    SLS 5 x 5-10 sec holds left side      Knee/Hip Exercises: Supine   Quad Sets Limitations 10x 5 sec hold     Short Arc Quad Sets Limitations 15    Heel Slides 10 reps    Straight Leg Raises AROM;Left;10 reps    Straight Leg Raises Limitations mild quad lag      Vasopneumatic   Number Minutes  Vasopneumatic  15 minutes    Vasopnuematic Location  Knee    Vasopneumatic Pressure Low    Vasopneumatic Temperature  34      Manual Therapy   Joint Mobilization gentl PA and AP glides grade IIand III     Passive ROM left knee flexion in sitting with slight distraction and tibial IR                  PT Education - 06/14/20 1025    Education Details reviewwed sleeping positions    Person(s) Educated Patient    Methods Explanation;Demonstration;Tactile cues;Verbal cues    Comprehension Verbalized understanding;Returned demonstration;Verbal cues required;Tactile cues required            PT Short Term Goals - 06/12/20 1022      PT SHORT TERM GOAL #1   Title i with initial HEP for proprioception    Baseline reviewed today    Time 3    Period Weeks    Status Achieved    Target Date 06/13/20      PT SHORT TERM GOAL #2   Title improve Lt knee flexion =/> 105 degrees to assist with stairs    Baseline 100    Time 3     Period Weeks    Status On-going      PT SHORT TERM GOAL #3   Title walk with normalized gait on even surfaces    Baseline still some antalgia, requiring SPC use, goal ongoing    Time 3    Period Weeks    Status On-going             PT Long Term Goals - 06/12/20 1024      PT LONG TERM GOAL #1   Title i with advanced HEP to include return to gym    Baseline updates ongoing    Time 6    Period Weeks      PT LONG TERM GOAL #2   Title improve Lt knee ext to flexion 0-120 to allow her to get on off the floor    Baseline ongoing, 2-100 deg    Time 6    Period Weeks    Status On-going      PT LONG TERM GOAL #3   Title improve Lt LE strength =/> 5-/5 to allow her to alternate steps on her stairs    Baseline left knee ext 4/5, 4+/5 flexion    Time 6    Period Weeks    Status On-going      PT LONG TERM GOAL #4   Title improve FOTO =/< 29% limited    Baseline 22% limited    Time 6    Period Weeks    Status Achieved      PT LONG TERM GOAL #5   Title report no more than 2/10 Lt knee pain at night to allow her to sleep per her previous level    Baseline painimproving but still >2/10    Time 6    Period Weeks    Status On-going                 Plan - 06/14/20 1047    Clinical Impression Statement Patient tolerated treatment well. hermotion remains at 5-100 degress. Therapy reviewed sleeping poisitions with her. She was keeping a pillow under her ankle in a side lying position which may have been contributing to lateral pain. She had no significan increase in pain with ttreamtnet. Therapy will continue to  progress functional exercises as tolerated.    Personal Factors and Comorbidities Comorbidity 1    Comorbidities arthritis    Examination-Activity Limitations Bathing;Locomotion Level;Stairs    Examination-Participation Restrictions Community Activity;Other    Stability/Clinical Decision Making Stable/Uncomplicated    Clinical Decision Making Low    Rehab Potential  Good    PT Frequency 2x / week    PT Duration 6 weeks    PT Treatment/Interventions ADLs/Self Care Home Management;Electrical Stimulation;Iontophoresis 4mg /ml Dexamethasone;Ultrasound;DME Instruction;Gait training;Functional mobility training;Therapeutic exercise;Therapeutic activities;Stair training;Neuromuscular re-education;Patient/family education;Passive range of motion;Taping    PT Next Visit Plan balance, gait, ROM, strength , progress HEP prn    PT Home Exercise Plan heel slides with strap, seated scoot stretch, long sitting quad set, squat at counter, step ups, step stretch for knee flexion vs. seated knee flexion with RLE assist, Romberg and SLS at counter    Consulted and Agree with Plan of Care Patient           Patient will benefit from skilled therapeutic intervention in order to improve the following deficits and impairments:  Abnormal gait, Decreased range of motion, Pain, Decreased strength, Decreased mobility, Decreased scar mobility, Increased muscle spasms  Visit Diagnosis: Acute pain of left knee  Stiffness of left knee, not elsewhere classified  Localized edema  Muscle weakness (generalized)  Other abnormalities of gait and mobility     Problem List Patient Active Problem List   Diagnosis Date Noted  . Arthritis of left knee 04/19/2020    04/21/2020 PT DPT  06/14/2020, 12:32 PM  [facility] [address] [place], [place], [place] Phone: [phone]   Fax:  [phone]  Name: Nancy Vasquez MRN: [name] Date of Birth: April 01, 1941

## 2020-06-19 ENCOUNTER — Other Ambulatory Visit: Payer: Self-pay

## 2020-06-19 ENCOUNTER — Encounter: Payer: Self-pay | Admitting: Physical Therapy

## 2020-06-19 ENCOUNTER — Ambulatory Visit: Payer: Medicare Other | Admitting: Physical Therapy

## 2020-06-19 DIAGNOSIS — M25562 Pain in left knee: Secondary | ICD-10-CM

## 2020-06-19 DIAGNOSIS — M25662 Stiffness of left knee, not elsewhere classified: Secondary | ICD-10-CM

## 2020-06-19 DIAGNOSIS — M6281 Muscle weakness (generalized): Secondary | ICD-10-CM

## 2020-06-19 DIAGNOSIS — R600 Localized edema: Secondary | ICD-10-CM

## 2020-06-19 DIAGNOSIS — R2689 Other abnormalities of gait and mobility: Secondary | ICD-10-CM

## 2020-06-19 NOTE — Therapy (Signed)
[facility] [address] [place], [place], [address] Phone: [phone]   Fax:  [phone]  Physical Therapy Treatment  Patient Details  Name: Nancy Vasquez MRN: [id number] Date of Birth: Jan 11, 1941 Referring Provider (PT): Dr Keitha Butte   Encounter Date: 06/19/2020   PT End of Session - 06/19/20 1235    Visit Number 8    Number of Visits 12    Date for PT Re-Evaluation 07/04/20    Authorization Type BCBS MCR    Authorization Time Period pnote 10th visit    PT Start Time 1015    PT Stop Time 1111    PT Time Calculation (min) 56 min    Activity Tolerance Patient tolerated treatment well    Behavior During Therapy [facility] for tasks assessed/performed           Past Medical History:  Diagnosis Date   Arthritis    has had right hip replacement    Past Surgical History:  Procedure Laterality Date   ABDOMINAL HYSTERECTOMY     BREAST EXCISIONAL BIOPSY Left    INTRAVASCULAR PRESSURE WIRE/FFR STUDY N/A 03/17/2017   Procedure: Intravascular Pressure Wire/FFR Study;  Surgeon: Rinaldo Cloud, MD;  Location: [facility] INVASIVE CV LAB;  Service: Cardiovascular;  Laterality: N/A;  mid LAD   LEFT HEART CATH AND CORONARY ANGIOGRAPHY N/A 03/17/2017   Procedure: Left Heart Cath and Coronary Angiography;  Surgeon: Rinaldo Cloud, MD;  Location: [facility] INVASIVE CV LAB;  Service: Cardiovascular;  Laterality: N/A;   TONSILLECTOMY     Removed as a child   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   Procedure: LEFT TOTAL KNEE ARTHROPLASTY;  Surgeon: Cammy Copa, MD;  Location: [facility] OR;  Service: Orthopedics;  Laterality: Left;    There were no vitals filed for this visit.   Subjective Assessment - 06/19/20 1020    Subjective Patient reports swelling continues in her knee, but is much better since last visit. She had some difficulty slepeing last night with pain down into the ankle. Tramadol taken to help with the pain.    Patient Stated Goals  bend her Lt knee and use it    Currently in Pain? Yes    Pain Score 2     Pain Location Knee    Pain Orientation Left    Pain Descriptors / Indicators Aching    Pain Type Chronic pain    Pain Onset More than a month ago    Pain Frequency Intermittent    Aggravating Factors  sleeping and prolonged positions    Pain Relieving Factors tylenol, tradmadol                             [facility] Adult PT Treatment/Exercise - 06/19/20 0001      Knee/Hip Exercises: Aerobic   Nustep L5 x 5 min UE/LE      Knee/Hip Exercises: Standing   Heel Raises Both;20 reps    Heel Raises Limitations A[employer]    Lateral Step Up Left;2 sets;10 reps;Hand Hold: 2;Step Height: 4"    Forward Step Up Left;2 sets;10 reps;Hand Hold: 2;Step Height: 6"    Functional Squat 20 reps    SLS 5 x 5-10 sec holds left side      Knee/Hip Exercises: Seated   Long Arc Quad AROM;Strengthening;Left;2 sets;10 reps      Knee/Hip Exercises: Supine   Quad Sets Limitations 10x 5 sec hold     Short Arc [employer]  Sets Limitations 15    Straight Leg Raises AROM;Left;15 reps    Straight Leg Raises Limitations improved strength       Vasopneumatic   Number Minutes Vasopneumatic  15 minutes    Vasopnuematic Location  Knee    Vasopneumatic Pressure Low    Vasopneumatic Temperature  34      Manual Therapy   Joint Mobilization gentle PA and AP glides grade IIand III     Passive ROM left knee flexion in sitting with slight distraction and tibial IR                  PT Education - 06/19/20 1235    Education Details reviewed POC    Person(s) Educated Patient    Methods Explanation;Demonstration;Verbal cues;Tactile cues    Comprehension Verbalized understanding;Returned demonstration;Verbal cues required;Tactile cues required            PT Short Term Goals - 06/12/20 1022      PT SHORT TERM GOAL #1   Title i with initial HEP for proprioception    Baseline reviewed today    Time 3    Period Weeks     Status Achieved    Target Date 06/13/20      PT SHORT TERM GOAL #2   Title improve Lt knee flexion =/> 105 degrees to assist with stairs    Baseline 100    Time 3    Period Weeks    Status On-going      PT SHORT TERM GOAL #3   Title walk with normalized gait on even surfaces    Baseline still some antalgia, requiring SPC use, goal ongoing    Time 3    Period Weeks    Status On-going             PT Long Term Goals - 06/12/20 1024      PT LONG TERM GOAL #1   Title i with advanced HEP to include return to gym    Baseline updates ongoing    Time 6    Period Weeks      PT LONG TERM GOAL #2   Title improve Lt knee ext to flexion 0-120 to allow her to get on off the floor    Baseline ongoing, 2-100 deg    Time 6    Period Weeks    Status On-going      PT LONG TERM GOAL #3   Title improve Lt LE strength =/> 5-/5 to allow her to alternate steps on her stairs    Baseline left knee ext 4/5, 4+/5 flexion    Time 6    Period Weeks    Status On-going      PT LONG TERM GOAL #4   Title improve FOTO =/< 29% limited    Baseline 22% limited    Time 6    Period Weeks    Status Achieved      PT LONG TERM GOAL #5   Title report no more than 2/10 Lt knee pain at night to allow her to sleep per her previous level    Baseline painimproving but still >2/10    Time 6    Period Weeks    Status On-going                 Plan - 06/19/20 1238    Clinical Impression Statement Patient tolerated ther-ex well. She had noi significant increase in pain. His knee pain was measured at 0-100 dgrees.  Therapy will continue to progress functional there-x as tolerated. She has not been using her cane at home. Therapy will also work on gait traing without the cane,    Personal Factors and Comorbidities Comorbidity 1    Comorbidities arthritis    Examination-Activity Limitations Bathing;Locomotion Level;Stairs    Examination-Participation Restrictions Community Activity;Other     Stability/Clinical Decision Making Stable/Uncomplicated    Clinical Decision Making Low    Rehab Potential Good    PT Frequency 2x / week    PT Duration 6 weeks    PT Treatment/Interventions ADLs/Self Care Home Management;Electrical Stimulation;Iontophoresis 4mg /ml Dexamethasone;Ultrasound;DME Instruction;Gait training;Functional mobility training;Therapeutic exercise;Therapeutic activities;Stair training;Neuromuscular re-education;Patient/family education;Passive range of motion;Taping    PT Next Visit Plan balance, gait, ROM, strength , progress HEP prn    PT Home Exercise Plan heel slides with strap, seated scoot stretch, long sitting quad set, squat at counter, step ups, step stretch for knee flexion vs. seated knee flexion with RLE assist, Romberg and SLS at counter           Patient will benefit from skilled therapeutic intervention in order to improve the following deficits and impairments:  Abnormal gait, Decreased range of motion, Pain, Decreased strength, Decreased mobility, Decreased scar mobility, Increased muscle spasms  Visit Diagnosis: Acute pain of left knee  Stiffness of left knee, not elsewhere classified  Localized edema  Muscle weakness (generalized)  Other abnormalities of gait and mobility     Problem List Patient Active Problem List   Diagnosis Date Noted   Arthritis of left knee 04/19/2020    Carney Living PT DPT  06/19/2020, 12:42 PM  [facility] [address] [place], [place], [address] Phone: [phone]   Fax:  [phone]  Name: DIANI JILLSON MRN: [id number] Date of Birth: 01/09/1941

## 2020-06-21 ENCOUNTER — Encounter: Payer: Self-pay | Admitting: Physical Therapy

## 2020-06-21 ENCOUNTER — Ambulatory Visit: Payer: Medicare Other | Admitting: Physical Therapy

## 2020-06-21 ENCOUNTER — Other Ambulatory Visit: Payer: Self-pay

## 2020-06-21 DIAGNOSIS — R600 Localized edema: Secondary | ICD-10-CM

## 2020-06-21 DIAGNOSIS — M25562 Pain in left knee: Secondary | ICD-10-CM

## 2020-06-21 DIAGNOSIS — M25662 Stiffness of left knee, not elsewhere classified: Secondary | ICD-10-CM

## 2020-06-21 DIAGNOSIS — M6281 Muscle weakness (generalized): Secondary | ICD-10-CM

## 2020-06-21 DIAGNOSIS — R2689 Other abnormalities of gait and mobility: Secondary | ICD-10-CM

## 2020-06-21 NOTE — Therapy (Signed)
[facility] [address] [place], [place], [address] Phone: [phone]   Fax:  [phone]  Physical Therapy Treatment  Patient Details  Name: Nancy Vasquez MRN: [id number] Date of Birth: Jul 05, 1941 Referring Provider (PT): Dr Keitha Butte   Encounter Date: 06/21/2020   PT End of Session - 06/21/20 1021    Visit Number 9    Number of Visits 12    Date for PT Re-Evaluation 07/04/20    PT Start Time 1018    PT Stop Time 1110    PT Time Calculation (min) 52 min    Activity Tolerance Patient tolerated treatment well    Behavior During Therapy [facility] for tasks assessed/performed           Past Medical History:  Diagnosis Date  . Arthritis    has had right hip replacement    Past Surgical History:  Procedure Laterality Date  . ABDOMINAL HYSTERECTOMY    . BREAST EXCISIONAL BIOPSY Left   . INTRAVASCULAR PRESSURE WIRE/FFR STUDY N/A 03/17/2017   Procedure: Intravascular Pressure Wire/FFR Study;  Surgeon: Rinaldo Cloud, MD;  Location: [facility] INVASIVE CV LAB;  Service: Cardiovascular;  Laterality: N/A;  mid LAD  . LEFT HEART CATH AND CORONARY ANGIOGRAPHY N/A 03/17/2017   Procedure: Left Heart Cath and Coronary Angiography;  Surgeon: Rinaldo Cloud, MD;  Location: [facility] INVASIVE CV LAB;  Service: Cardiovascular;  Laterality: N/A;  . TONSILLECTOMY     Removed as a child  . 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   Procedure: LEFT TOTAL KNEE ARTHROPLASTY;  Surgeon: Cammy Copa, MD;  Location: [facility] OR;  Service: Orthopedics;  Laterality: Left;    There were no vitals filed for this visit.   Subjective Assessment - 06/21/20 1019    Subjective Patient reports she was in a lot of pain last night when going to sleep. Patient sleeps on her back. Took tradmadol to help with the pain. Not currently in pain, just generalized discomfort. Patient felt good after last session.    Currently in Pain? Yes    Pain Score 1     Pain Location Knee     Pain Orientation Left    Pain Descriptors / Indicators Aching    Pain Type Chronic pain    Pain Onset More than a month ago    Pain Frequency Intermittent                             [facility] Adult PT Treatment/Exercise - 06/21/20 0001      Knee/Hip Exercises: Stretches   Other Knee/Hip Stretches supine PROM stretch x 5 reps 10-15 sec hold      Knee/Hip Exercises: Aerobic   Nustep L4 x 5 min UE/LE      Knee/Hip Exercises: Standing   Lateral Step Up Left;1 set;10 reps;Step Height: 4"    Forward Step Up Left;1 set;10 reps;Step Height: 4"      Knee/Hip Exercises: Supine   Quad Sets Limitations 15x 5 sec hold    Short Arc Quad Sets Limitations 15    Straight Leg Raises AROM;Left;15 reps    Straight Leg Raises Limitations min quad lag      Vasopneumatic   Number Minutes Vasopneumatic  15 minutes    Vasopnuematic Location  Knee    Vasopneumatic Pressure Low    Vasopneumatic Temperature  34      Manual Therapy   Manual therapy comments IASTM to quad  Joint Mobilization gentle PA and AP glides grade II and III     Passive ROM left knee flexion in sitting with slight distraction and tibial IR                    PT Short Term Goals - 06/12/20 1022      PT SHORT TERM GOAL #1   Title i with initial HEP for proprioception    Baseline reviewed today    Time 3    Period Weeks    Status Achieved    Target Date 06/13/20      PT SHORT TERM GOAL #2   Title improve Lt knee flexion =/> 105 degrees to assist with stairs    Baseline 100    Time 3    Period Weeks    Status On-going      PT SHORT TERM GOAL #3   Title walk with normalized gait on even surfaces    Baseline still some antalgia, requiring SPC use, goal ongoing    Time 3    Period Weeks    Status On-going             PT Long Term Goals - 06/12/20 1024      PT LONG TERM GOAL #1   Title i with advanced HEP to include return to gym    Baseline updates ongoing    Time 6    Period  Weeks      PT LONG TERM GOAL #2   Title improve Lt knee ext to flexion 0-120 to allow her to get on off the floor    Baseline ongoing, 2-100 deg    Time 6    Period Weeks    Status On-going      PT LONG TERM GOAL #3   Title improve Lt LE strength =/> 5-/5 to allow her to alternate steps on her stairs    Baseline left knee ext 4/5, 4+/5 flexion    Time 6    Period Weeks    Status On-going      PT LONG TERM GOAL #4   Title improve FOTO =/< 29% limited    Baseline 22% limited    Time 6    Period Weeks    Status Achieved      PT LONG TERM GOAL #5   Title report no more than 2/10 Lt knee pain at night to allow her to sleep per her previous level    Baseline painimproving but still >2/10    Time 6    Period Weeks    Status On-going                 Plan - 06/21/20 1123    Clinical Impression Statement Patient tolerated treatment well. She had no significant pain. Her knee PROM was measured at 0-102 degrees. Pain/aching improved wtih exercises. Therapy continued to work on strenghtening quad and hip. Patinet had some muscle lag and fatigue with SLR, but no verbal cues were stated.    Personal Factors and Comorbidities Comorbidity 1    Stability/Clinical Decision Making Stable/Uncomplicated    Clinical Decision Making Low    Rehab Potential Good    PT Frequency 2x / week    PT Duration 6 weeks    PT Treatment/Interventions ADLs/Self Care Home Management;Electrical Stimulation;Ultrasound;DME Instruction;Gait training;Functional mobility training;Therapeutic exercise;Therapeutic activities;Stair training;Neuromuscular re-education;Patient/family education;Passive range of motion;Taping    PT Next Visit Plan assess stairs, balance, gait, ROM, strength , progress HEP  prn    PT Home Exercise Plan heel slides with strap, seated scoot stretch, long sitting quad set, squat at counter, step ups, step stretch for knee flexion vs. seated knee flexion with RLE assist, Romberg and SLS at  counter    Consulted and Agree with Plan of Care Patient           Patient will benefit from skilled therapeutic intervention in order to improve the following deficits and impairments:  Abnormal gait, Decreased range of motion, Pain, Decreased strength, Decreased mobility, Decreased scar mobility, Increased muscle spasms  Visit Diagnosis: No diagnosis found.     Problem List Patient Active Problem List   Diagnosis Date Noted  . Arthritis of left knee 04/19/2020    Minna Merritts SPT  06/21/2020, 11:29 AM  [facility] [address] [place], [place], [address] Phone: [phone]   Fax:  [phone]  Name: Nancy Vasquez MRN: [id number] Date of Birth: 05/09/1941

## 2020-06-22 NOTE — Therapy (Signed)
[place], [place], [address] Phone: [phone]   Fax:  [phone]  Physical Therapy Treatment  Patient Details  Name: Nancy Vasquez MRN: [id number] Date of Birth: [birth year] Referring Provider (PT): Dr Mittie Bodo   Encounter Date: 06/21/2020   PT End of Session - 06/21/20 1021    Visit Number 9    Number of Visits 12    Date for PT Re-Evaluation 07/04/20    PT Start Time 1018    PT Stop Time 1115    PT Time Calculation (min) 57 min    Activity Tolerance Patient tolerated treatment well    Behavior During Therapy [facility] for tasks assessed/performed           Past Medical History:  Diagnosis Date  . Arthritis    has had right hip replacement    Past Surgical History:  Procedure Laterality Date  . ABDOMINAL HYSTERECTOMY    . BREAST EXCISIONAL BIOPSY Left   . INTRAVASCULAR PRESSURE WIRE/FFR STUDY N/A 03/17/2017   Procedure: Intravascular Pressure Wire/FFR Study;  Surgeon: Charolette Forward, MD;  Location: [place] CV LAB;  Service: Cardiovascular;  Laterality: N/A;  mid LAD  . LEFT HEART CATH AND CORONARY ANGIOGRAPHY N/A 03/17/2017   Procedure: Left Heart Cath and Coronary Angiography;  Surgeon: Charolette Forward, MD;  Location: [place] CV LAB;  Service: Cardiovascular;  Laterality: N/A;  . TONSILLECTOMY     Removed as a child  . 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   Procedure: LEFT TOTAL KNEE ARTHROPLASTY;  Surgeon: Meredith Pel, MD;  Location: [place];  Service: Orthopedics;  Laterality: Left;    There were no vitals filed for this visit.   Subjective Assessment - 06/21/20 1019    Subjective Patient reports she was in a lot of pain last night when going to sleep. Patient sleeps on her back. Took tradmadol to help with the pain. Not currently in pain, just generalized discomfort. Patient felt good after last session.    Currently in Pain? Yes    Pain Score 1     Pain Location Knee     Pain Orientation Left    Pain Descriptors / Indicators Aching    Pain Type Chronic pain    Pain Onset More than a month ago    Pain Frequency Intermittent                             [facility] Adult PT Treatment/Exercise - 06/22/20 0001      Knee/Hip Exercises: Stretches   Other Knee/Hip Stretches supine PROM stretch x 5 reps 10-15 sec hold      Knee/Hip Exercises: Aerobic   Nustep L4 x 5 min UE/LE      Knee/Hip Exercises: Standing   Heel Raises 20 reps    Lateral Step Up Left;1 set;10 reps;Step Height: 4"    Forward Step Up Left;1 set;10 reps;Step Height: 4"      Knee/Hip Exercises: Supine   Quad Sets Limitations x15 5 sec hold     Short Arc Quad Sets Limitations tried to add weight but unable x15 without weight     Straight Leg Raises AROM;Left;15 reps    Straight Leg Raises Limitations min quad lag      Vasopneumatic   Number Minutes Vasopneumatic  15 minutes    Vasopnuematic Location  Knee    Vasopneumatic Pressure Low    Vasopneumatic Temperature  34  Manual Therapy   Manual therapy comments IASTM to quad     Joint Mobilization gentle PA and AP glides grade II and III     Passive ROM left knee flexion in sitting with slight distraction and tibial IR                    PT Short Term Goals - 06/12/20 1022      PT SHORT TERM GOAL #1   Title i with initial HEP for proprioception    Baseline reviewed today    Time 3    Period Weeks    Status Achieved    Target Date 06/13/20      PT SHORT TERM GOAL #2   Title improve Lt knee flexion =/> 105 degrees to assist with stairs    Baseline 100    Time 3    Period Weeks    Status On-going      PT SHORT TERM GOAL #3   Title walk with normalized gait on even surfaces    Baseline still some antalgia, requiring SPC use, goal ongoing    Time 3    Period Weeks    Status On-going             PT Long Term Goals - 06/12/20 1024      PT LONG TERM GOAL #1   Title i with advanced HEP to  include return to gym    Baseline updates ongoing    Time 6    Period Weeks      PT LONG TERM GOAL #2   Title improve Lt knee ext to flexion 0-120 to allow her to get on off the floor    Baseline ongoing, 2-100 deg    Time 6    Period Weeks    Status On-going      PT LONG TERM GOAL #3   Title improve Lt LE strength =/> 5-/5 to allow her to alternate steps on her stairs    Baseline left knee ext 4/5, 4+/5 flexion    Time 6    Period Weeks    Status On-going      PT LONG TERM GOAL #4   Title improve FOTO =/< 29% limited    Baseline 22% limited    Time 6    Period Weeks    Status Achieved      PT LONG TERM GOAL #5   Title report no more than 2/10 Lt knee pain at night to allow her to sleep per her previous level    Baseline painimproving but still >2/10    Time 6    Period Weeks    Status On-going                 Plan - 06/21/20 1123    Clinical Impression Statement Patient tolerated treatment well. She had no significant pain. Her knee PROM was measured at 0-102 degrees. Pain/aching improved wtih exercises. Therapy continued to work on strenghtening quad and hip. Patinet had some muscle lag and fatigue with SLR, but no verbal cues were stated.    Personal Factors and Comorbidities Comorbidity 1    Stability/Clinical Decision Making Stable/Uncomplicated    Clinical Decision Making Low    Rehab Potential Good    PT Frequency 2x / week    PT Duration 6 weeks    PT Treatment/Interventions ADLs/Self Care Home Management;Electrical Stimulation;Ultrasound;DME Instruction;Gait training;Functional mobility training;Therapeutic exercise;Therapeutic activities;Stair training;Neuromuscular re-education;Patient/family education;Passive range of motion;Taping  PT Next Visit Plan assess stairs, balance, gait, ROM, strength , progress HEP prn    PT Home Exercise Plan heel slides with strap, seated scoot stretch, long sitting quad set, squat at counter, step ups, step stretch for  knee flexion vs. seated knee flexion with RLE assist, Romberg and SLS at counter    Consulted and Agree with Plan of Care Patient           Patient will benefit from skilled therapeutic intervention in order to improve the following deficits and impairments:  Abnormal gait, Decreased range of motion, Pain, Decreased strength, Decreased mobility, Decreased scar mobility, Increased muscle spasms  Visit Diagnosis: Stiffness of left knee, not elsewhere classified  Acute pain of left knee  Localized edema  Muscle weakness (generalized)  Other abnormalities of gait and mobility     Problem List Patient Active Problem List   Diagnosis Date Noted  . Arthritis of left knee 04/19/2020    Dessie Coma  PT DPT 06/22/2020, 7:39 AM   Lowell Bouton SPT  06/21/2020  [facility] [address] [place], [place], [address] Phone: [phone]   Fax:  [phone]  Name: Nancy Vasquez MRN: [id number] Date of Birth: 12-Sep-1941

## 2020-06-26 ENCOUNTER — Ambulatory Visit: Payer: Medicare Other | Admitting: Physical Therapy

## 2020-06-26 ENCOUNTER — Other Ambulatory Visit: Payer: Self-pay

## 2020-06-26 ENCOUNTER — Encounter: Payer: Self-pay | Admitting: Physical Therapy

## 2020-06-26 DIAGNOSIS — R600 Localized edema: Secondary | ICD-10-CM

## 2020-06-26 DIAGNOSIS — M25662 Stiffness of left knee, not elsewhere classified: Secondary | ICD-10-CM | POA: Diagnosis not present

## 2020-06-26 DIAGNOSIS — R2689 Other abnormalities of gait and mobility: Secondary | ICD-10-CM

## 2020-06-26 DIAGNOSIS — M6281 Muscle weakness (generalized): Secondary | ICD-10-CM

## 2020-06-26 DIAGNOSIS — M25562 Pain in left knee: Secondary | ICD-10-CM

## 2020-06-26 NOTE — Therapy (Signed)
[facility] Outpatient Rehabilitation [facility] [address] [place], [place], [address] Phone: [phone]   Fax:  [phone]  Physical Therapy Treatment/Progress note   Patient Details  Name: Nancy Vasquez MRN: [id number] Date of Birth: [birth year] Referring Provider (PT): Dr Keitha Butte  Progress Note Reporting Period 05/23/2020 to 06/26/2020  See note below for Objective Data and Assessment of Progress/Goals.       Encounter Date: 06/26/2020   PT End of Session - 06/26/20 1025    Visit Number 10    Number of Visits 22    Date for PT Re-Evaluation 08/07/20    Authorization Type [employer] MCR    Authorization Time Period 20th visit progress note    PT Start Time 1020    PT Stop Time 1115    PT Time Calculation (min) 55 min    Activity Tolerance Patient tolerated treatment well    Behavior During Therapy WFL for tasks assessed/performed           Past Medical History:  Diagnosis Date  . Arthritis    has had right hip replacement    Past Surgical History:  Procedure Laterality Date  . ABDOMINAL HYSTERECTOMY    . BREAST EXCISIONAL BIOPSY Left   . INTRAVASCULAR PRESSURE WIRE/FFR STUDY N/A 03/17/2017   Procedure: Intravascular Pressure Wire/FFR Study;  Surgeon: Rinaldo Cloud, MD;  Location: [facility] INVASIVE CV LAB;  Service: Cardiovascular;  Laterality: N/A;  mid LAD  . LEFT HEART CATH AND CORONARY ANGIOGRAPHY N/A 03/17/2017   Procedure: Left Heart Cath and Coronary Angiography;  Surgeon: Rinaldo Cloud, MD;  Location: [facility] INVASIVE CV LAB;  Service: Cardiovascular;  Laterality: N/A;  . TONSILLECTOMY     Removed as a child  . 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   Procedure: LEFT TOTAL KNEE ARTHROPLASTY;  Surgeon: Cammy Copa, MD;  Location: [facility] OR;  Service: Orthopedics;  Laterality: Left;    There were no vitals filed for this visit.   Subjective Assessment - 06/26/20 1023    Subjective Patient reports she has been feeling stiff the last few days.  She thinks she has been sleeping better the past few nights. Patient states she felt "really good" after last session.    Currently in Pain? Yes    Pain Score 0-No pain   just stiffness   Pain Location Knee    Pain Orientation Left    Pain Descriptors / Indicators Aching              [facility] PT Assessment - 06/26/20 0001      PROM   Left Knee Extension 0    Left Knee Flexion 103      Strength   Left Hip Flexion 4/5    Left Hip ABduction 4-/5    Left Knee Flexion 4+/5    Left Knee Extension 4+/5                         [facility] Adult PT Treatment/Exercise - 06/26/20 0001      Knee/Hip Exercises: Aerobic   Nustep L5 x 5 min UE/LE      Knee/Hip Exercises: Standing   Lateral Step Up Left;1 set;10 reps;Step Height: 4"    Forward Step Up Left;1 set;10 reps;Step Height: 4"    Other Standing Knee Exercises Standing march x20       Knee/Hip Exercises: Supine   Quad Sets Limitations x20     Short Arc Quad Sets Limitations x20  Straight Leg Raises AROM;Left;2 sets;10 reps      Vasopneumatic   Number Minutes Vasopneumatic  15 minutes    Vasopnuematic Location  Knee    Vasopneumatic Pressure Low    Vasopneumatic Temperature  34      Manual Therapy   Manual therapy comments trigger point release to quad     Joint Mobilization gentle PA and AP glides grade II and III     Passive ROM left knee PROM Into flexion; mulligan strap mobilization with heat                     PT Short Term Goals - 06/26/20 1304      PT SHORT TERM GOAL #1   Title i with initial HEP for proprioception    Baseline reviewed today    Time 3    Period Weeks    Status Achieved    Target Date 06/13/20      PT SHORT TERM GOAL #2   Title improve Lt knee flexion =/> 105 degrees to assist with stairs    Baseline 103 today    Time 3    Period Weeks    Status On-going    Target Date 06/13/20      PT SHORT TERM GOAL #3   Title walk with normalized gait on even surfaces     Baseline still has an antalgic gait    Time 3    Period Weeks    Status On-going    Target Date 08/07/20             PT Long Term Goals - 06/26/20 1306      PT LONG TERM GOAL #1   Title i with advanced HEP to include return to gym    Baseline continue to progress    Time 6    Period Weeks    Status On-going      PT LONG TERM GOAL #2   Title improve Lt knee ext to flexion 0-120 to allow her to get on off the floor    Baseline ongoing, 2-103 deg    Period Weeks    Status On-going      PT LONG TERM GOAL #3   Title improve Lt LE strength =/> 5-/5 to allow her to alternate steps on her stairs    Baseline improving knee extension; hip flexion and knee flexion consistent    Time 6    Period Weeks    Status On-going      PT LONG TERM GOAL #4   Title improve FOTO =/< 29% limited    Baseline not tested today    Time 6    Period Weeks    Status Achieved      PT LONG TERM GOAL #5   Title report no more than 2/10 Lt knee pain at night to allow her to sleep per her previous level    Baseline still having some difficulty sleeping    Time 6    Period Weeks    Status On-going                 Plan - 06/26/20 1047    Clinical Impression Statement Patient continues to reportsastiffness in the morning and with gait. Her pain levels have been decreasing. Her range has progressed but has plateaued over the past few visits. Therapy used mulligan mobilization with movement today along with mosit heat. Her range was measured at 0-103 after stretching. She continues to  have pain and gaurd at end range. She was encouraged to continue with self streytching 2-3x daily at home. Therapy continues to progress patient with functional strengthening such as steps and squats. She would benefit from further skilled therapy 2W6 to continue to work on functional mobility and range of motion for flexion    Personal Factors and Comorbidities Comorbidity 1    Comorbidities arthritis     Examination-Activity Limitations Bathing;Locomotion Level;Stairs    Examination-Participation Restrictions Community Activity;Other    Clinical Decision Making Low    Rehab Potential Good    PT Frequency 2x / week    PT Duration 6 weeks    PT Treatment/Interventions ADLs/Self Care Home Management;Electrical Stimulation;Ultrasound;DME Instruction;Gait training;Functional mobility training;Therapeutic exercise;Therapeutic activities;Stair training;Neuromuscular re-education;Patient/family education;Passive range of motion;Taping    PT Next Visit Plan continue to wok on strethcing for flexion    PT Home Exercise Plan heel slides with strap, seated scoot stretch, long sitting quad set, squat at counter, step ups, step stretch for knee flexion vs. seated knee flexion with RLE assist, Romberg and SLS at counter    Consulted and Agree with Plan of Care Patient           Patient will benefit from skilled therapeutic intervention in order to improve the following deficits and impairments:  Abnormal gait, Decreased range of motion, Pain, Decreased strength, Decreased mobility, Decreased scar mobility, Increased muscle spasms  Visit Diagnosis: Stiffness of left knee, not elsewhere classified  Acute pain of left knee  Localized edema  Muscle weakness (generalized)  Other abnormalities of gait and mobility     Problem List Patient Active Problem List   Diagnosis Date Noted  . Arthritis of left knee 04/19/2020    Dessie Coma PT DPT  06/26/2020, 1:10 PM  [facility] [address] [place], [place], [address] Phone: [phone]   Fax:  [phone]  Name: ORLY QUIMBY MRN: [id number] Date of Birth: 10/16/1941

## 2020-06-26 NOTE — Addendum Note (Signed)
Addended by: Dessie Coma on: 06/26/2020 01:41 PM   Modules accepted: Orders

## 2020-06-27 ENCOUNTER — Ambulatory Visit (INDEPENDENT_AMBULATORY_CARE_PROVIDER_SITE_OTHER): Payer: Medicare Other | Admitting: Orthopedic Surgery

## 2020-06-27 DIAGNOSIS — Z96652 Presence of left artificial knee joint: Secondary | ICD-10-CM

## 2020-06-28 ENCOUNTER — Ambulatory Visit: Payer: Medicare Other | Attending: Surgical | Admitting: Physical Therapy

## 2020-06-28 ENCOUNTER — Other Ambulatory Visit: Payer: Self-pay

## 2020-06-28 ENCOUNTER — Encounter: Payer: Self-pay | Admitting: Physical Therapy

## 2020-06-28 DIAGNOSIS — M25662 Stiffness of left knee, not elsewhere classified: Secondary | ICD-10-CM | POA: Insufficient documentation

## 2020-06-28 DIAGNOSIS — M6281 Muscle weakness (generalized): Secondary | ICD-10-CM | POA: Insufficient documentation

## 2020-06-28 DIAGNOSIS — R600 Localized edema: Secondary | ICD-10-CM | POA: Insufficient documentation

## 2020-06-28 DIAGNOSIS — R2689 Other abnormalities of gait and mobility: Secondary | ICD-10-CM | POA: Diagnosis present

## 2020-06-28 DIAGNOSIS — M25562 Pain in left knee: Secondary | ICD-10-CM | POA: Diagnosis present

## 2020-06-28 NOTE — Therapy (Signed)
[facility] Outpatient Rehabilitation [facility] [address] [place], [place], [address] Phone: [phone]   Fax:  [phone]  Physical Therapy Treatment  Patient Details  Name: Nancy Vasquez MRN: [id number] Date of Birth: 08-24-1941 Referring Provider (PT): Dr Keitha Butte   Encounter Date: 06/28/2020   PT End of Session - 06/28/20 1017    Visit Number 11    Number of Visits 22    Date for PT Re-Evaluation 08/07/20    PT Start Time 1016    PT Stop Time 1100    PT Time Calculation (min) 44 min    Activity Tolerance Patient tolerated treatment well    Behavior During Therapy [facility] for tasks assessed/performed           Past Medical History:  Diagnosis Date  . Arthritis    has had right hip replacement    Past Surgical History:  Procedure Laterality Date  . ABDOMINAL HYSTERECTOMY    . BREAST EXCISIONAL BIOPSY Left   . INTRAVASCULAR PRESSURE WIRE/FFR STUDY N/A 03/17/2017   Procedure: Intravascular Pressure Wire/FFR Study;  Surgeon: Rinaldo Cloud, MD;  Location: [facility] INVASIVE CV LAB;  Service: Cardiovascular;  Laterality: N/A;  mid LAD  . LEFT HEART CATH AND CORONARY ANGIOGRAPHY N/A 03/17/2017   Procedure: Left Heart Cath and Coronary Angiography;  Surgeon: Rinaldo Cloud, MD;  Location: [facility] INVASIVE CV LAB;  Service: Cardiovascular;  Laterality: N/A;  . TONSILLECTOMY     Removed as a child  . 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   Procedure: LEFT TOTAL KNEE ARTHROPLASTY;  Surgeon: Cammy Copa, MD;  Location: [facility] OR;  Service: Orthopedics;  Laterality: Left;    There were no vitals filed for this visit.   Subjective Assessment - 06/28/20 1018    Subjective Patient states she has been feeling good after the last session. She went to the pool a few days ago and did some walking and marches. She reports her knee feels a little looser this morning.    Currently in Pain? No/denies    Pain Score 0-No pain                              [facility] Adult PT Treatment/Exercise - 06/28/20 0001      Knee/Hip Exercises: Aerobic   Nustep L5 x 5 min UE/LE      Knee/Hip Exercises: Standing   Heel Raises 20 reps    Hip Abduction Left;20 reps    Lateral Step Up Left;1 set;15 reps;5 reps;Step Height: 6"    Forward Step Up Left;1 set;15 reps;Step Height: 6"    Functional Squat Limitations --    Other Standing Knee Exercises Standing march x20       Knee/Hip Exercises: Supine   Quad Sets Limitations x20    Short Arc Quad Sets Limitations x20    Straight Leg Raises AROM;Left;2 sets;15 reps    Other Supine Knee/Hip Exercises Clam shells x20   red      Vasopneumatic   Number Minutes Vasopneumatic  15 minutes    Vasopnuematic Location  Knee    Vasopneumatic Pressure Low    Vasopneumatic Temperature  34      Manual Therapy   Manual therapy comments trigger point release to quad     Passive ROM left knee PROM Into flexion                    PT Short Term Goals -  06/26/20 1304      PT SHORT TERM GOAL #1   Title i with initial HEP for proprioception    Baseline reviewed today    Time 3    Period Weeks    Status Achieved    Target Date 06/13/20      PT SHORT TERM GOAL #2   Title improve Lt knee flexion =/> 105 degrees to assist with stairs    Baseline 103 today    Time 3    Period Weeks    Status On-going    Target Date 06/13/20      PT SHORT TERM GOAL #3   Title walk with normalized gait on even surfaces    Baseline still has an antalgic gait    Time 3    Period Weeks    Status On-going    Target Date 08/07/20             PT Long Term Goals - 06/26/20 1306      PT LONG TERM GOAL #1   Title i with advanced HEP to include return to gym    Baseline continue to progress    Time 6    Period Weeks    Status On-going      PT LONG TERM GOAL #2   Title improve Lt knee ext to flexion 0-120 to allow her to get on off the floor    Baseline ongoing, 2-103 deg    Period Weeks    Status On-going      PT  LONG TERM GOAL #3   Title improve Lt LE strength =/> 5-/5 to allow her to alternate steps on her stairs    Baseline improving knee extension; hip flexion and knee flexion consistent    Time 6    Period Weeks    Status On-going      PT LONG TERM GOAL #4   Title improve FOTO =/< 29% limited    Baseline not tested today    Time 6    Period Weeks    Status Achieved      PT LONG TERM GOAL #5   Title report no more than 2/10 Lt knee pain at night to allow her to sleep per her previous level    Baseline still having some difficulty sleeping    Time 6    Period Weeks    Status On-going                 Plan - 06/28/20 1117    Clinical Impression Statement Therapy continued to do trigger point release and PROM for knee flexion. Patient demonstrated an increase in knee flexion at 0-106 today after stretching which was a few degrees of improvement from last session. Therapy continued working on hip and quad strengthening, progressing the step height from 4" to 6". Patient tolerated treatment well. Therapy reinforced the need to continue exercises and stretches at home.    PT Treatment/Interventions ADLs/Self Care Home Management;Electrical Stimulation;Ultrasound;DME Instruction;Gait training;Functional mobility training;Therapeutic exercise;Therapeutic activities;Stair training;Neuromuscular re-education;Patient/family education;Passive range of motion;Taping    PT Next Visit Plan continue to wok on stretching for flexion    PT Home Exercise Plan heel slides with strap, seated scoot stretch, long sitting quad set, squat at counter, step ups, step stretch for knee flexion vs. seated knee flexion with RLE assist, Romberg and SLS at counter    Consulted and Agree with Plan of Care Patient           Patient  will benefit from skilled therapeutic intervention in order to improve the following deficits and impairments:  Abnormal gait, Decreased range of motion, Pain, Decreased strength,  Decreased mobility, Decreased scar mobility, Increased muscle spasms  Visit Diagnosis: Stiffness of left knee, not elsewhere classified  Acute pain of left knee  Localized edema  Muscle weakness (generalized)  Other abnormalities of gait and mobility     Problem List Patient Active Problem List   Diagnosis Date Noted  . Arthritis of left knee 04/19/2020    Lowell Bouton SPT 06/28/2020, 11:24 AM  [facility] [address] [place], [place], [address] Phone: [phone]   Fax:  [phone]  Name: DONIKA BUTNER MRN: [id number] Date of Birth: Jan 13, 1941

## 2020-07-02 ENCOUNTER — Encounter: Payer: Self-pay | Admitting: Orthopedic Surgery

## 2020-07-02 NOTE — Progress Notes (Signed)
° °  Post-Op Visit Note   Patient: Nancy Vasquez           Date of Birth: November 02, 1941           MRN: [id number] Visit Date: 06/27/2020 PCP: Macy Mis, MD   Assessment & Plan:  Chief Complaint:  Chief Complaint  Patient presents with   Post-op Follow-up   Visit Diagnoses:  1. Status post total left knee replacement     Plan: Patient is a 79 year old female who presents s/p left total knee arthroplasty on 04/19/2020.  She notes that she is improving slowly but steadily.  She is ambulating with a cane.  She is going to physical therapy 2 times a week and also doing a home exercise program.  She feels that she is continuing to make improvements week by week in physical therapy.  She is taking Tylenol as needed with the occasional tramadol.  She discontinued her Celebrex due to a perceived increased pressure behind her right eye.  Denies any fevers, chills, night sweats.  She does note she has difficulty sleeping still due to knee pain but this is continuing to improve.  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  On exam she has a well-healed incision with 5 degrees of knee extension and 100 to 105 degrees of knee flexion.  No calf tenderness and a negative Homans' sign on exam.  Plan to continue physical therapy.  Patient is doing well overall.  Follow-up in 2 months for final check.  Follow-Up Instructions: No follow-ups on file.   Orders:  No orders of the defined types were placed in this encounter.  No orders of the defined types were placed in this encounter.   Imaging: No results found.  PMFS History: Patient Active Problem List   Diagnosis Date Noted   Arthritis of left knee 04/19/2020   Past Medical History:  Diagnosis Date   Arthritis    has had right hip replacement    Family History  Problem Relation Age of Onset   Breast cancer Neg Hx     Past Surgical History:  Procedure Laterality Date   ABDOMINAL HYSTERECTOMY     BREAST EXCISIONAL BIOPSY Left     INTRAVASCULAR PRESSURE WIRE/FFR STUDY N/A 03/17/2017   Procedure: Intravascular Pressure Wire/FFR Study;  Surgeon: Rinaldo Cloud, MD;  Location: [facility] INVASIVE CV LAB;  Service: Cardiovascular;  Laterality: N/A;  mid LAD   LEFT HEART CATH AND CORONARY ANGIOGRAPHY N/A 03/17/2017   Procedure: Left Heart Cath and Coronary Angiography;  Surgeon: Rinaldo Cloud, MD;  Location: [facility] INVASIVE CV LAB;  Service: Cardiovascular;  Laterality: N/A;   TONSILLECTOMY     Removed as a child   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   Procedure: LEFT TOTAL KNEE ARTHROPLASTY;  Surgeon: Cammy Copa, MD;  Location: [facility] OR;  Service: Orthopedics;  Laterality: Left;   Social History   Occupational History   Not on file  Tobacco Use   Smoking status: Never Smoker   Smokeless tobacco: Never Used  Substance and Sexual Activity   Alcohol use: Never   Drug use: Never   Sexual activity: Not on file

## 2020-07-04 ENCOUNTER — Ambulatory Visit: Payer: Medicare Other | Admitting: Orthopedic Surgery

## 2020-07-05 ENCOUNTER — Encounter: Payer: Self-pay | Admitting: Physical Therapy

## 2020-07-05 ENCOUNTER — Ambulatory Visit: Payer: Medicare Other | Admitting: Physical Therapy

## 2020-07-05 ENCOUNTER — Other Ambulatory Visit: Payer: Self-pay

## 2020-07-05 DIAGNOSIS — M25662 Stiffness of left knee, not elsewhere classified: Secondary | ICD-10-CM

## 2020-07-05 DIAGNOSIS — R2689 Other abnormalities of gait and mobility: Secondary | ICD-10-CM

## 2020-07-05 DIAGNOSIS — R600 Localized edema: Secondary | ICD-10-CM

## 2020-07-05 DIAGNOSIS — M6281 Muscle weakness (generalized): Secondary | ICD-10-CM

## 2020-07-05 DIAGNOSIS — M25562 Pain in left knee: Secondary | ICD-10-CM

## 2020-07-05 NOTE — Therapy (Addendum)
[facility] Outpatient Rehabilitation [facility] [address] [place], [place], [address] Phone: [phone]   Fax:  [phone]  Physical Therapy Treatment  Patient Details  Name: Nancy Vasquez MRN: [id number] Date of Birth: 1941/06/06 Referring Provider (PT): Dr Keitha Butte   Encounter Date: 07/05/2020   PT End of Session - 07/05/20 1555    Visit Number 12    Number of Visits 22    Date for PT Re-Evaluation 08/07/20    Authorization Type BCBS MCR    Authorization Time Period 20th visit progress note    PT Start Time 1416    PT Stop Time 1502    PT Time Calculation (min) 46 min    Activity Tolerance Patient tolerated treatment well    Behavior During Therapy [facility] for tasks assessed/performed           Past Medical History:  Diagnosis Date  . Arthritis    has had right hip replacement    Past Surgical History:  Procedure Laterality Date  . ABDOMINAL HYSTERECTOMY    . BREAST EXCISIONAL BIOPSY Left   . INTRAVASCULAR PRESSURE WIRE/FFR STUDY N/A 03/17/2017   Procedure: Intravascular Pressure Wire/FFR Study;  Surgeon: Rinaldo Cloud, MD;  Location: [facility] INVASIVE CV LAB;  Service: Cardiovascular;  Laterality: N/A;  mid LAD  . LEFT HEART CATH AND CORONARY ANGIOGRAPHY N/A 03/17/2017   Procedure: Left Heart Cath and Coronary Angiography;  Surgeon: Rinaldo Cloud, MD;  Location: [facility] INVASIVE CV LAB;  Service: Cardiovascular;  Laterality: N/A;  . TONSILLECTOMY     Removed as a child  . 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   Procedure: LEFT TOTAL KNEE ARTHROPLASTY;  Surgeon: Cammy Copa, MD;  Location: [facility] OR;  Service: Orthopedics;  Laterality: Left;    There were no vitals filed for this visit.   Subjective Assessment - 07/05/20 1421    Subjective Patient states she is sore today. She woke up this morning and her knee was very sore. Pt states she thinks the rainy weather is affecting her knee. Over the weekend her knee was feeling better.    Patient Stated  Goals bend her Lt knee and use it    Currently in Pain? Yes    Pain Score 2    mainly soreness   Pain Location Knee    Pain Orientation Left    Pain Descriptors / Indicators Aching    Pain Type Chronic pain    Pain Onset More than a month ago    Pain Frequency Intermittent                             [facility] Adult PT Treatment/Exercise - 07/05/20 0001      Knee/Hip Exercises: Aerobic   Nustep L4 x 5 min UE/LE      Knee/Hip Exercises: Standing   Hip Abduction Left;Right;10 reps    Lateral Step Up Left;1 set;15 reps;5 reps;Step Height: 4"    Forward Step Up Left;1 set;15 reps;Step Height: 4"    Functional Squat Limitations partial squat at counnter x 15 reps    Other Standing Knee Exercises Standing march x20       Knee/Hip Exercises: Supine   Quad Sets Limitations 20 reps    Short Arc Quad Sets Limitations 2x10    Straight Leg Raises AROM;Left;2 sets;15 reps    Other Supine Knee/Hip Exercises Clam shells x20   green     Vasopneumatic   Number Minutes Vasopneumatic  15 minutes    Vasopnuematic Location  Knee    Vasopneumatic Pressure Low    Vasopneumatic Temperature  34      Manual Therapy   Manual therapy comments trigger point release to quad; to posterior knee    Passive ROM left knee PROM Into flexion                  PT Education - 07/05/20 1554    Education Details HEP and symptom management; benefits of pool exercises    Person(s) Educated Patient    Methods Explanation;Demonstration;Tactile cues;Verbal cues    Comprehension Tactile cues required;Verbal cues required;Returned demonstration;Verbalized understanding            PT Short Term Goals - 06/26/20 1304      PT SHORT TERM GOAL #1   Title i with initial HEP for proprioception    Baseline reviewed today    Time 3    Period Weeks    Status Achieved    Target Date 06/13/20      PT SHORT TERM GOAL #2   Title improve Lt knee flexion =/> 105 degrees to assist with stairs     Baseline 103 today    Time 3    Period Weeks    Status On-going    Target Date 06/13/20      PT SHORT TERM GOAL #3   Title walk with normalized gait on even surfaces    Baseline still has an antalgic gait    Time 3    Period Weeks    Status On-going    Target Date 08/07/20             PT Long Term Goals - 06/26/20 1306      PT LONG TERM GOAL #1   Title i with advanced HEP to include return to gym    Baseline continue to progress    Time 6    Period Weeks    Status On-going      PT LONG TERM GOAL #2   Title improve Lt knee ext to flexion 0-120 to allow her to get on off the floor    Baseline ongoing, 2-103 deg    Period Weeks    Status On-going      PT LONG TERM GOAL #3   Title improve Lt LE strength =/> 5-/5 to allow her to alternate steps on her stairs    Baseline improving knee extension; hip flexion and knee flexion consistent    Time 6    Period Weeks    Status On-going      PT LONG TERM GOAL #4   Title improve FOTO =/< 29% limited    Baseline not tested today    Time 6    Period Weeks    Status Achieved      PT LONG TERM GOAL #5   Title report no more than 2/10 Lt knee pain at night to allow her to sleep per her previous level    Baseline still having some difficulty sleeping    Time 6    Period Weeks    Status On-going                 Plan - 07/05/20 1557    Clinical Impression Statement Patient exhibited more tightness today. Therapy did some STW on the posterior knee and quad to release the muscle tightness. PROM was done to continue stretching knee flexion. Therapy continues to work on quad, hip, and  glute strenghtening and step height was decreased to 4" today due to pt's increased soreness. Pt tolerated treatment well. Therapy will continue progressing exercises as tolerated.    Personal Factors and Comorbidities Comorbidity 1    Examination-Activity Limitations Bathing;Locomotion Level;Stairs    Examination-Participation Restrictions  Community Activity;Other    Stability/Clinical Decision Making Stable/Uncomplicated    Clinical Decision Making Low    Rehab Potential Good    PT Frequency 2x / week    PT Duration 6 weeks    PT Treatment/Interventions ADLs/Self Care Home Management;Electrical Stimulation;Ultrasound;DME Instruction;Gait training;Functional mobility training;Therapeutic exercise;Therapeutic activities;Stair training;Neuromuscular re-education;Patient/family education;Passive range of motion;Taping    PT Next Visit Plan continue to wok on stretching for flexion; continue progressing strengthening exercises    PT Home Exercise Plan heel slides with strap, seated scoot stretch, long sitting quad set, squat at counter, step ups, step stretch for knee flexion vs. seated knee flexion with RLE assist, Romberg and SLS at counter    Consulted and Agree with Plan of Care Patient           Patient will benefit from skilled therapeutic intervention in order to improve the following deficits and impairments:  Abnormal gait, Decreased range of motion, Pain, Decreased strength, Decreased mobility, Decreased scar mobility, Increased muscle spasms  Visit Diagnosis: Stiffness of left knee, not elsewhere classified  Acute pain of left knee  Localized edema  Muscle weakness (generalized)  Other abnormalities of gait and mobility     Problem List Patient Active Problem List   Diagnosis Date Noted  . Arthritis of left knee 04/19/2020   During this treatment session, the therapist was present, participating in and directing the treatment.   Lorayne Bender PT DPT  07/05/2020 Lowell Bouton SPT 07/05/2020, 4:03 PM  [facility] [address] [place], [place], [address] Phone: [phone]   Fax:  [phone]  Name: Nancy Vasquez MRN: [id number] Date of Birth: 07-20-1941

## 2020-07-24 ENCOUNTER — Ambulatory Visit: Payer: Medicare Other | Admitting: Physical Therapy

## 2020-07-24 ENCOUNTER — Other Ambulatory Visit: Payer: Self-pay

## 2020-07-24 ENCOUNTER — Encounter: Payer: Self-pay | Admitting: Physical Therapy

## 2020-07-24 DIAGNOSIS — M25662 Stiffness of left knee, not elsewhere classified: Secondary | ICD-10-CM | POA: Diagnosis not present

## 2020-07-24 DIAGNOSIS — M25562 Pain in left knee: Secondary | ICD-10-CM

## 2020-07-24 DIAGNOSIS — M6281 Muscle weakness (generalized): Secondary | ICD-10-CM

## 2020-07-24 DIAGNOSIS — R600 Localized edema: Secondary | ICD-10-CM

## 2020-07-24 DIAGNOSIS — R2689 Other abnormalities of gait and mobility: Secondary | ICD-10-CM

## 2020-07-24 NOTE — Therapy (Signed)
[facility] Outpatient Rehabilitation [facility] [address] [place], [place], [address] Phone: [phone]   Fax:  [phone]  Physical Therapy Treatment  Patient Details  Name: Nancy Vasquez MRN: [id number] Date of Birth: [birth year] Referring Provider (PT): Dr Keitha Butte   Encounter Date: 07/24/2020   PT End of Session - 07/24/20 1024    Visit Number 13    Number of Visits 22    Date for PT Re-Evaluation 08/07/20    Authorization Type BCBS MCR    Authorization Time Period 20th visit progress note    PT Start Time 1016    PT Stop Time 1058    PT Time Calculation (min) 42 min    Activity Tolerance Patient tolerated treatment well    Behavior During Therapy [facility] for tasks assessed/performed           Past Medical History:  Diagnosis Date  . Arthritis    has had right hip replacement    Past Surgical History:  Procedure Laterality Date  . ABDOMINAL HYSTERECTOMY    . BREAST EXCISIONAL BIOPSY Left   . INTRAVASCULAR PRESSURE WIRE/FFR STUDY N/A 03/17/2017   Procedure: Intravascular Pressure Wire/FFR Study;  Surgeon: Rinaldo Cloud, MD;  Location: [facility] INVASIVE CV LAB;  Service: Cardiovascular;  Laterality: N/A;  mid LAD  . LEFT HEART CATH AND CORONARY ANGIOGRAPHY N/A 03/17/2017   Procedure: Left Heart Cath and Coronary Angiography;  Surgeon: Rinaldo Cloud, MD;  Location: [facility] INVASIVE CV LAB;  Service: Cardiovascular;  Laterality: N/A;  . TONSILLECTOMY     Removed as a child  . 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   Procedure: LEFT TOTAL KNEE ARTHROPLASTY;  Surgeon: Cammy Copa, MD;  Location: [facility] OR;  Service: Orthopedics;  Laterality: Left;    There were no vitals filed for this visit.   Subjective Assessment - 07/24/20 1022    Subjective Pt reports she is feeling good today, no pain. She was able to get in the hot tub since last visit and said that felt great.    Patient Stated Goals bend her Lt knee and use it    Currently in Pain? No/denies     Pain Location Knee    Pain Onset More than a month ago    Pain Frequency Intermittent    Aggravating Factors  sleeping    Pain Relieving Factors tylenol              [facility] PT Assessment - 07/24/20 0001      Assessment   Medical Diagnosis Lt TKA     Referring Provider (PT) Dr Keitha Butte      PROM   Left Knee Extension 0    Left Knee Flexion 108                         [facility] Adult PT Treatment/Exercise - 07/24/20 0001      Knee/Hip Exercises: Aerobic   Nustep L4 x 5 min UE/LE      Knee/Hip Exercises: Standing   Hip Abduction Left;Right;15 reps    Lateral Step Up Left;15 reps;Step Height: 6"    Forward Step Up Left;15 reps;Step Height: 6"    Functional Squat Limitations partial squat at counter x20 reps    Other Standing Knee Exercises standing march x15 each leg      Knee/Hip Exercises: Supine   Quad Sets 15 reps    Short Arc Quad Sets 20 reps    Straight Leg Raises Right;Left;15  reps    Other Supine Knee/Hip Exercises Clam shells x30 green band      Manual Therapy   Manual therapy comments IASTM to quad and ITB    Passive ROM left knee PROM Into flexion   measured at 108 degrees flexion                 PT Education - 07/24/20 1056    Education Details reviewed HEP and symptom mangement    Person(s) Educated Patient    Methods Explanation;Tactile cues;Verbal cues;Demonstration    Comprehension Returned demonstration;Verbal cues required;Verbalized understanding;Tactile cues required            PT Short Term Goals - 06/26/20 1304      PT SHORT TERM GOAL #1   Title i with initial HEP for proprioception    Baseline reviewed today    Time 3    Period Weeks    Status Achieved    Target Date 06/13/20      PT SHORT TERM GOAL #2   Title improve Lt knee flexion =/> 105 degrees to assist with stairs    Baseline 103 today    Time 3    Period Weeks    Status On-going    Target Date 06/13/20      PT SHORT TERM GOAL #3   Title walk with  normalized gait on even surfaces    Baseline still has an antalgic gait    Time 3    Period Weeks    Status On-going    Target Date 08/07/20             PT Long Term Goals - 06/26/20 1306      PT LONG TERM GOAL #1   Title i with advanced HEP to include return to gym    Baseline continue to progress    Time 6    Period Weeks    Status On-going      PT LONG TERM GOAL #2   Title improve Lt knee ext to flexion 0-120 to allow her to get on off the floor    Baseline ongoing, 2-103 deg    Period Weeks    Status On-going      PT LONG TERM GOAL #3   Title improve Lt LE strength =/> 5-/5 to allow her to alternate steps on her stairs    Baseline improving knee extension; hip flexion and knee flexion consistent    Time 6    Period Weeks    Status On-going      PT LONG TERM GOAL #4   Title improve FOTO =/< 29% limited    Baseline not tested today    Time 6    Period Weeks    Status Achieved      PT LONG TERM GOAL #5   Title report no more than 2/10 Lt knee pain at night to allow her to sleep per her previous level    Baseline still having some difficulty sleeping    Time 6    Period Weeks    Status On-going                 Plan - 07/24/20 1124    Clinical Impression Statement Pt stated feeling looser today. therapy did STW to quads and ITB to release muscle tigthness. PROM for knee flexion was measured at 108 which improved from last visit. Therapy worked on hip, glute, and quad strengthening, and patinet tolerates exercises well. Continue progressing strengthening  exercises and work on gait to minimize lateral shift.    Personal Factors and Comorbidities Comorbidity 1    Comorbidities arthritis    Examination-Activity Limitations Bathing;Locomotion Level;Stairs    Examination-Participation Restrictions Community Activity;Other    Stability/Clinical Decision Making Stable/Uncomplicated    Clinical Decision Making Low    Rehab Potential Good    PT Frequency 2x /  week    PT Duration 6 weeks    PT Treatment/Interventions ADLs/Self Care Home Management;Electrical Stimulation;Ultrasound;DME Instruction;Gait training;Functional mobility training;Therapeutic exercise;Therapeutic activities;Stair training;Neuromuscular re-education;Patient/family education;Passive range of motion;Taping    PT Next Visit Plan continue to wok on stretching for flexion; continue progressing strengthening exercises; gait training to work on lateral shift    PT Home Exercise Plan heel slides with strap, seated scoot stretch, long sitting quad set, squat at counter, step ups, step stretch for knee flexion vs. seated knee flexion with RLE assist, Romberg and SLS at counter    Consulted and Agree with Plan of Care Patient           Patient will benefit from skilled therapeutic intervention in order to improve the following deficits and impairments:  Abnormal gait, Decreased range of motion, Pain, Decreased strength, Decreased mobility, Decreased scar mobility, Increased muscle spasms  Visit Diagnosis: Stiffness of left knee, not elsewhere classified  Acute pain of left knee  Localized edema  Muscle weakness (generalized)  Other abnormalities of gait and mobility     Problem List Patient Active Problem List   Diagnosis Date Noted  . Arthritis of left knee 04/19/2020    Dessie Coma  PT DPT  07/24/2020, 3:36 PM  Lowell Bouton SPT  07/24/2020  During this treatment session, the therapist was present, participating in and directing the treatment.    [facility] Outpatient Rehabilitation [facility] [address] [place], [place], [address] Phone: [phone]   Fax:  [phone]  Name: Nancy Vasquez MRN: [id number] Date of Birth: 10/06/1941

## 2020-07-25 ENCOUNTER — Encounter: Payer: Medicare Other | Admitting: Physical Therapy

## 2020-07-26 ENCOUNTER — Encounter: Payer: Self-pay | Admitting: Physical Therapy

## 2020-07-26 ENCOUNTER — Other Ambulatory Visit: Payer: Self-pay

## 2020-07-26 ENCOUNTER — Ambulatory Visit: Payer: Medicare Other | Admitting: Physical Therapy

## 2020-07-26 DIAGNOSIS — M25662 Stiffness of left knee, not elsewhere classified: Secondary | ICD-10-CM | POA: Diagnosis not present

## 2020-07-26 DIAGNOSIS — M6281 Muscle weakness (generalized): Secondary | ICD-10-CM

## 2020-07-26 DIAGNOSIS — R600 Localized edema: Secondary | ICD-10-CM

## 2020-07-26 DIAGNOSIS — M25562 Pain in left knee: Secondary | ICD-10-CM

## 2020-07-26 DIAGNOSIS — R2689 Other abnormalities of gait and mobility: Secondary | ICD-10-CM

## 2020-07-26 NOTE — Therapy (Signed)
[facility] Outpatient Rehabilitation [facility] [address] [place], [place], [address] Phone: [phone]   Fax:  [phone]  Physical Therapy Treatment  Patient Details  Name: Nancy Vasquez MRN: [id number] Date of Birth: [birth year] Referring Provider (PT): Dr Keitha Butte   Encounter Date: 07/26/2020   PT End of Session - 07/26/20 1020    Visit Number 14    Number of Visits 22    Date for PT Re-Evaluation 08/07/20    Authorization Type BCBS MCR    Authorization Time Period 20th visit progress note    PT Start Time 1020    PT Stop Time 1117    PT Time Calculation (min) 57 min    Activity Tolerance Patient tolerated treatment well    Behavior During Therapy [facility] for tasks assessed/performed           Past Medical History:  Diagnosis Date   Arthritis    has had right hip replacement    Past Surgical History:  Procedure Laterality Date   ABDOMINAL HYSTERECTOMY     BREAST EXCISIONAL BIOPSY Left    INTRAVASCULAR PRESSURE WIRE/FFR STUDY N/A 03/17/2017   Procedure: Intravascular Pressure Wire/FFR Study;  Surgeon: Rinaldo Cloud, MD;  Location: [facility] INVASIVE CV LAB;  Service: Cardiovascular;  Laterality: N/A;  mid LAD   LEFT HEART CATH AND CORONARY ANGIOGRAPHY N/A 03/17/2017   Procedure: Left Heart Cath and Coronary Angiography;  Surgeon: Rinaldo Cloud, MD;  Location: [facility] INVASIVE CV LAB;  Service: Cardiovascular;  Laterality: N/A;   TONSILLECTOMY     Removed as a child   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   Procedure: LEFT TOTAL KNEE ARTHROPLASTY;  Surgeon: Cammy Copa, MD;  Location: [facility] OR;  Service: Orthopedics;  Laterality: Left;    There were no vitals filed for this visit.   Subjective Assessment - 07/26/20 1046    Subjective Pt states she is not doing good today. Yesterday she could barely get around. She tried walking, but could not finish her walk. She is a little better today, but still in a lot of pain, and the knee feels swollen.      Patient Stated Goals bend her Lt knee and use it    Currently in Pain? Yes    Pain Score 4    pt says yesterday it was 10/10   Pain Location Knee    Pain Orientation Left    Pain Descriptors / Indicators Aching    Pain Type Chronic pain    Pain Onset More than a month ago    Pain Frequency Intermittent    Aggravating Factors  sleeping    Pain Relieving Factors tylenol                             [facility] Adult PT Treatment/Exercise - 07/26/20 0001      Knee/Hip Exercises: Supine   Quad Sets 2 sets;10 reps    Short Arc Quad Sets 2 sets;15 reps    Other Supine Knee/Hip Exercises Clam shells x30 green band      Vasopneumatic   Number Minutes Vasopneumatic  15 minutes    Vasopnuematic Location  Knee    Vasopneumatic Pressure Low    Vasopneumatic Temperature  34      Manual Therapy   Manual Therapy Passive ROM;Edema management    Edema Management Edema massage to L knee    Passive ROM left knee PROM Into flexion   measured at  108 degrees flexion                 PT Education - 07/26/20 1104    Education Details symptom management    Person(s) Educated Patient    Methods Demonstration;Explanation    Comprehension Verbalized understanding;Returned demonstration            PT Short Term Goals - 06/26/20 1304      PT SHORT TERM GOAL #1   Title i with initial HEP for proprioception    Baseline reviewed today    Time 3    Period Weeks    Status Achieved    Target Date 06/13/20      PT SHORT TERM GOAL #2   Title improve Lt knee flexion =/> 105 degrees to assist with stairs    Baseline 103 today    Time 3    Period Weeks    Status On-going    Target Date 06/13/20      PT SHORT TERM GOAL #3   Title walk with normalized gait on even surfaces    Baseline still has an antalgic gait    Time 3    Period Weeks    Status On-going    Target Date 08/07/20             PT Long Term Goals - 06/26/20 1306      PT LONG TERM GOAL #1   Title i  with advanced HEP to include return to gym    Baseline continue to progress    Time 6    Period Weeks    Status On-going      PT LONG TERM GOAL #2   Title improve Lt knee ext to flexion 0-120 to allow her to get on off the floor    Baseline ongoing, 2-103 deg    Period Weeks    Status On-going      PT LONG TERM GOAL #3   Title improve Lt LE strength =/> 5-/5 to allow her to alternate steps on her stairs    Baseline improving knee extension; hip flexion and knee flexion consistent    Time 6    Period Weeks    Status On-going      PT LONG TERM GOAL #4   Title improve FOTO =/< 29% limited    Baseline not tested today    Time 6    Period Weeks    Status Achieved      PT LONG TERM GOAL #5   Title report no more than 2/10 Lt knee pain at night to allow her to sleep per her previous level    Baseline still having some difficulty sleeping    Time 6    Period Weeks    Status On-going                 Plan - 07/26/20 1121    Clinical Impression Statement Pt presents with more pain and stiffness today. Edema massage was done on L knee to help control fluid and decrease swelling. therapy did light exercises for quad and hip strenthening due to patinet's pain and stiffness. Therapy suggested pt continue to walk in the pool to help reduce swelling and get her knee moving. Her knee was not red or hot.    Personal Factors and Comorbidities Comorbidity 1    Comorbidities arthritis    Examination-Activity Limitations Bathing;Locomotion Level;Stairs    Examination-Participation Restrictions Community Activity;Other    Stability/Clinical Decision Making Stable/Uncomplicated  Rehab Potential Good    PT Frequency 2x / week    PT Duration 6 weeks    PT Treatment/Interventions ADLs/Self Care Home Management;Electrical Stimulation;Ultrasound;DME Instruction;Gait training;Functional mobility training;Therapeutic exercise;Therapeutic activities;Stair training;Neuromuscular  re-education;Patient/family education;Passive range of motion;Taping    PT Next Visit Plan continue to wok on stretching for flexion; continue progressing strengthening exercises; gait training to work on lateral shift    PT Home Exercise Plan heel slides with strap, seated scoot stretch, long sitting quad set, squat at counter, step ups, step stretch for knee flexion vs. seated knee flexion with RLE assist, Romberg and SLS at counter    Consulted and Agree with Plan of Care Patient           Patient will benefit from skilled therapeutic intervention in order to improve the following deficits and impairments:  Abnormal gait, Decreased range of motion, Pain, Decreased strength, Decreased mobility, Decreased scar mobility, Increased muscle spasms  Visit Diagnosis: Stiffness of left knee, not elsewhere classified  Acute pain of left knee  Localized edema  Muscle weakness (generalized)  Other abnormalities of gait and mobility     Problem List Patient Active Problem List   Diagnosis Date Noted   Arthritis of left knee 04/19/2020    Dessie Coma PT DPT  07/26/2020, 12:06 PM   Lowell Bouton  7/29/20201  [facility] [address] [place], [place], [address] Phone: [phone]   Fax:  [phone]  Name: Nancy Vasquez MRN: [id number] Date of Birth: 03/14/1941

## 2020-07-27 ENCOUNTER — Telehealth: Payer: Self-pay | Admitting: Orthopedic Surgery

## 2020-07-27 NOTE — Telephone Encounter (Signed)
Ok to come in

## 2020-07-27 NOTE — Telephone Encounter (Signed)
Patient called advised she has been going to (PT)    Patient said she went to (PT) Tuesday and now she is having trouble standing on her left leg. Patient asked if she should schedule an appointment to see Dr August Saucer.  Patient said right below her knee is swollen.  The number to contact patient is [phone]

## 2020-07-27 NOTE — Telephone Encounter (Signed)
Pls advise.  

## 2020-07-30 ENCOUNTER — Telehealth: Payer: Self-pay

## 2020-07-30 ENCOUNTER — Ambulatory Visit: Payer: Medicare Other | Admitting: Orthopedic Surgery

## 2020-07-30 ENCOUNTER — Encounter: Payer: Self-pay | Admitting: Orthopedic Surgery

## 2020-07-30 ENCOUNTER — Ambulatory Visit: Payer: Self-pay

## 2020-07-30 DIAGNOSIS — Z96652 Presence of left artificial knee joint: Secondary | ICD-10-CM

## 2020-07-30 NOTE — Progress Notes (Signed)
Office Visit Note   Patient: Nancy Vasquez           Date of Birth: 1941-04-03           MRN: [id number] Visit Date: 07/30/2020 Requested by: Macy Mis, MD [address] [place],  [place] [address] PCP: Macy Mis, MD  Subjective: Chief Complaint  Patient presents with  . Left Knee - Pain    HPI: Nancy Vasquez is a 79 y.o. female who presents to the office complaining of left knee pain.  She is s/p left total knee arthroplasty on 04/19/2020.  She complains of severe pain and stiffness.  Pain onset was last Wednesday.  She woke up with severe pain without any injury.  She denies any fevers, chills, drainage.  She has been ambulating with a cane.  Pain is worse in the morning and gets better throughout the day.  She has not taken any NSAIDs because it causes her stomach pain..                ROS: All systems reviewed are negative as they relate to the chief complaint within the history of present illness.  Patient denies fevers or chills.  Assessment & Plan: Visit Diagnoses:  1. Status post total left knee replacement     Plan: Patient is a 79 year old female presents s/p left total knee arthroplasty on 04/19/2020.  She has had increased pain in her left knee without injury over the last 5 days.  No signs of infection.  She does have calf tenderness today.  Radiographs taken today are negative for any acute pathology to explain her pain.  No lucency surrounding the prosthesis.  No fractures noted.  Plan to obtain ultrasound to rule out DVT of the left lower extremity.  Discontinue physical therapy this week.  Restart physical therapy next week.  Follow-up in 2 weeks for clinical recheck.  Patient agreed with plan.  Follow-Up Instructions: No follow-ups on file.   Orders:  Orders Placed This Encounter  Procedures  . XR Knee 1-2 Views Left  . VAS [place] LOWER EXTREMITY VENOUS (DVT)   No orders of the defined types were placed in this encounter.      Procedures: No procedures performed   Clinical Data: No additional findings.  Objective: Vital Signs: There were no vitals taken for this visit.  Physical Exam:  Constitutional: Patient appears well-developed HEENT:  Head: Normocephalic Eyes:EOM are normal Neck: Normal range of motion Cardiovascular: Normal rate Pulmonary/chest: Effort normal Neurologic: Patient is alert Skin: Skin is warm Psychiatric: Patient has normal mood and affect  Ortho Exam: Ortho exam demonstrates well-healed incision without any evidence of erythema, drainage, dehiscence.  No knee effusion on exam today.  Flexes to 108 degrees.  Extension to 0 degrees.  Tenderness throughout the posterior calf.  Negative Homans' sign.  Extensor mechanism intact.  Mild tenderness in the medial and lateral joint lines.  No gross instability in flexion or extension.  Specialty Comments:  No specialty comments available.  Imaging: No results found.   PMFS History: Patient Active Problem List   Diagnosis Date Noted  . Arthritis of left knee 04/19/2020   Past Medical History:  Diagnosis Date  . Arthritis    has had right hip replacement    Family History  Problem Relation Age of Onset  . Breast cancer Neg Hx     Past Surgical History:  Procedure Laterality Date  . ABDOMINAL HYSTERECTOMY    .  BREAST EXCISIONAL BIOPSY Left   . INTRAVASCULAR PRESSURE WIRE/FFR STUDY N/A 03/17/2017   Procedure: Intravascular Pressure Wire/FFR Study;  Surgeon: Rinaldo Cloud, MD;  Location: [facility] INVASIVE CV LAB;  Service: Cardiovascular;  Laterality: N/A;  mid LAD  . LEFT HEART CATH AND CORONARY ANGIOGRAPHY N/A 03/17/2017   Procedure: Left Heart Cath and Coronary Angiography;  Surgeon: Rinaldo Cloud, MD;  Location: [facility] INVASIVE CV LAB;  Service: Cardiovascular;  Laterality: N/A;  . TONSILLECTOMY     Removed as a child  . 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   Procedure: LEFT TOTAL KNEE ARTHROPLASTY;  Surgeon: Cammy Copa, MD;   Location: [facility] OR;  Service: Orthopedics;  Laterality: Left;   Social History   Occupational History  . Not on file  Tobacco Use  . Smoking status: Never Smoker  . Smokeless tobacco: Never Used  Substance and Sexual Activity  . Alcohol use: Never  . Drug use: Never  . Sexual activity: Not on file

## 2020-07-30 NOTE — Telephone Encounter (Signed)
Worked patient in for this afternoon

## 2020-07-30 NOTE — Telephone Encounter (Signed)
Patient seen in office Monday afternoon, Dr August Saucer would like patient to be scheduled for doppler on Tuesday. I tried calling vascular lab and could not get anyone to answer. Can you please try to get patient scheduled.

## 2020-07-31 ENCOUNTER — Ambulatory Visit (HOSPITAL_COMMUNITY)
Admission: RE | Admit: 2020-07-31 | Discharge: 2020-07-31 | Disposition: A | Discharge status: Home / Self Care | Payer: Medicare Other | Source: Ambulatory Visit | Attending: Orthopedic Surgery | Admitting: Orthopedic Surgery

## 2020-07-31 ENCOUNTER — Ambulatory Visit: Payer: Medicare Other | Admitting: Physical Therapy

## 2020-07-31 ENCOUNTER — Other Ambulatory Visit: Payer: Self-pay

## 2020-07-31 ENCOUNTER — Encounter (HOSPITAL_COMMUNITY): Payer: Medicare Other

## 2020-07-31 DIAGNOSIS — Z96652 Presence of left artificial knee joint: Secondary | ICD-10-CM | POA: Diagnosis present

## 2020-07-31 NOTE — Telephone Encounter (Signed)
Tried calling pt again, no answer, left vm  

## 2020-07-31 NOTE — Telephone Encounter (Signed)
Pt is scheduled today at 1:30pm with arrival at 1:15pm at [address] [facility].   I tried calling pt, left vm to return my call

## 2020-07-31 NOTE — Progress Notes (Signed)
Left lower extremity venous duplex has been completed. Preliminary results can be found in CV Proc through chart review.  Results were given to Dr. August Saucer.  07/31/20 3:49 PM Olen Cordial RVT

## 2020-07-31 NOTE — Telephone Encounter (Signed)
Pt called back and stated her phone was messed up and did nto get my messages, she is scheduled back at [facility] later today for the Ultrasound.

## 2020-08-02 ENCOUNTER — Telehealth: Payer: Self-pay | Admitting: Orthopedic Surgery

## 2020-08-02 NOTE — Telephone Encounter (Signed)
Pls advise.  

## 2020-08-02 NOTE — Telephone Encounter (Signed)
Patient called asked if she can start (PT) back again? The number to contact patient is [phone]

## 2020-08-02 NOTE — Telephone Encounter (Signed)
Tried calling back. No answer. LMVM advising may resume PT

## 2020-08-02 NOTE — Telephone Encounter (Signed)
y

## 2020-08-03 ENCOUNTER — Encounter: Payer: Self-pay | Admitting: Orthopedic Surgery

## 2020-08-07 ENCOUNTER — Encounter: Payer: Self-pay | Admitting: Physical Therapy

## 2020-08-07 ENCOUNTER — Other Ambulatory Visit: Payer: Self-pay

## 2020-08-07 ENCOUNTER — Ambulatory Visit: Payer: Medicare Other | Attending: Surgical | Admitting: Physical Therapy

## 2020-08-07 DIAGNOSIS — R2689 Other abnormalities of gait and mobility: Secondary | ICD-10-CM | POA: Diagnosis present

## 2020-08-07 DIAGNOSIS — M6281 Muscle weakness (generalized): Secondary | ICD-10-CM | POA: Diagnosis present

## 2020-08-07 DIAGNOSIS — M25562 Pain in left knee: Secondary | ICD-10-CM | POA: Insufficient documentation

## 2020-08-07 DIAGNOSIS — M25662 Stiffness of left knee, not elsewhere classified: Secondary | ICD-10-CM | POA: Insufficient documentation

## 2020-08-07 DIAGNOSIS — R600 Localized edema: Secondary | ICD-10-CM | POA: Diagnosis present

## 2020-08-07 NOTE — Therapy (Signed)
Nancy Vasquez, [place], [address] Phone: [phone]   Fax:  [phone]  Physical Therapy Treatment/Recertification Progress Note Reporting Period 06/26/20 to 08/07/20  See note below for Objective Data and Assessment of Progress/Goals.       Patient Details  Name: Nancy Vasquez MRN: [id number] Date of Birth: 1941/11/23 Referring Provider (PT): Dr Mittie Bodo   Encounter Date: 08/07/2020   PT End of Session - 08/07/20 0932    Visit Number 15    Number of Visits 26    Date for PT Re-Evaluation 09/18/20    Authorization Type BCBS MCR    Authorization Time Period next progress note at visit 25, add KX    PT Start Time 0928    PT Stop Time 1028    PT Time Calculation (min) 60 min    Activity Tolerance Patient limited by pain    Behavior During Therapy [facility] for tasks assessed/performed           Past Medical History:  Diagnosis Date  . Arthritis    has had right hip replacement    Past Surgical History:  Procedure Laterality Date  . ABDOMINAL HYSTERECTOMY    . BREAST EXCISIONAL BIOPSY Left   . INTRAVASCULAR PRESSURE WIRE/FFR STUDY N/A 03/17/2017   Procedure: Intravascular Pressure Wire/FFR Study;  Surgeon: Charolette Forward, MD;  Location: [place] CV LAB;  Service: Cardiovascular;  Laterality: N/A;  mid LAD  . LEFT HEART CATH AND CORONARY ANGIOGRAPHY N/A 03/17/2017   Procedure: Left Heart Cath and Coronary Angiography;  Surgeon: Charolette Forward, MD;  Location: [place] CV LAB;  Service: Cardiovascular;  Laterality: N/A;  . TONSILLECTOMY     Removed as a child  . 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   Procedure: LEFT TOTAL KNEE ARTHROPLASTY;  Surgeon: Meredith Pel, MD;  Location: [place];  Service: Orthopedics;  Laterality: Left;    There were no vitals filed for this visit.   Subjective Assessment - 08/07/20 0933    Subjective Pt. returns, not seen for therapy since 07/26/20-she had been  experiencing left lower leg pain and therapy was on hold while she underwent Doppler US which was (-) for DVT. Pt. cleared by MD to resume therapy. She continues with left knee and lower leg pain 3/10 this AM.    Currently in Pain? Yes    Pain Location Knee    Pain Orientation Left    Pain Descriptors / Indicators Aching    Pain Type Chronic pain    Pain Onset More than a month ago    Pain Frequency Intermittent    Aggravating Factors  lying down/sleeping position    Pain Relieving Factors tylenol and tramadol              [facility] PT Assessment - 08/07/20 0001      Observation/Other Assessments   Focus on Therapeutic Outcomes (FOTO)  33% limited      AROM   Left Knee Extension 4    Left Knee Flexion 90      PROM   Left Knee Extension 2    Left Knee Flexion 95      Strength   Left Knee Flexion 5/5    Left Knee Extension 4+/5                         [facility] Adult PT Treatment/Exercise - 08/07/20 0001      Knee/Hip Exercises: Stretches  Gastroc Stretch Left;3 reps;20 seconds    Gastroc Stretch Limitations standig gastroc stretch at counter      Knee/Hip Exercises: Standing   Hip Abduction AROM;Stengthening;Right;Left;15 reps    Lateral Step Up Left;15 reps;Hand Hold: 2;Step Height: 4"    Forward Step Up Left;15 reps;Hand Hold: 2;Step Height: 4"    Functional Squat 15 reps    Functional Squat Limitations partial squat at counter    Other Standing Knee Exercises "church pews" x 15 reps standing in front of chair      Knee/Hip Exercises: Seated   Long Arc Quad AROM;Strengthening;Left;2 sets;10 reps    Long Arc Quad Weight 3 lbs.      Knee/Hip Exercises: Supine   Quad Sets 2 sets;10 reps    Quad Sets Limitations tactile cues ro quad activation and verbal cues to minimize compensatory glut activation    Short Arc Quad Sets 2 sets;15 reps    Bridges --    Bridges Limitations attempted but unable due to hip pain      Vasopneumatic   Number Minutes  Vasopneumatic  15 minutes    Vasopnuematic Location  Knee    Vasopneumatic Pressure Low    Vasopneumatic Temperature  34      Manual Therapy   Passive ROM left knee flexion and extension in supine                  PT Education - 08/07/20 0952    Education Details POC, HEP    Person(s) Educated Patient    Methods Explanation;Demonstration;Verbal cues    Comprehension Verbalized understanding            PT Short Term Goals - 08/07/20 1016      PT SHORT TERM GOAL #1   Title i with initial HEP for proprioception    Baseline met    Time 3    Period Weeks    Status Achieved      PT SHORT TERM GOAL #2   Title improve Lt knee flexion =/> 105 degrees to assist with stairs    Baseline 90 deg AROM 08/07/20    Time 3    Period Weeks    Status On-going    Target Date 08/28/20      PT SHORT TERM GOAL #3   Title walk with normalized gait on even surfaces    Baseline still has an antalgic gait    Time 3    Period Weeks    Status On-going    Target Date 08/28/20             PT Long Term Goals - 08/07/20 1004      PT LONG TERM GOAL #1   Title i with advanced HEP to include return to gym    Baseline ongoing    Time 6    Period Weeks    Status On-going    Target Date 09/18/20      PT LONG TERM GOAL #2   Title improve Lt knee ext to flexion 0-120 to allow her to get on off the floor    Baseline 4-90 deg 08/07/20    Time 6    Period Weeks    Status On-going    Target Date 09/18/20      PT LONG TERM GOAL #3   Title improve Lt LE strength =/> 5-/5 to allow her to alternate steps on her stairs    Baseline left knee ext 4+/5, flex 5/5    Time   6    Period Weeks    Status Partially Met    Target Date 09/18/20      PT LONG TERM GOAL #5   Title report no more than 2/10 Lt knee pain at night to allow her to sleep per her previous level    Baseline 3/10 pre-tx., intermittent increased/higher pain level    Time 6    Period Weeks    Status On-going    Target Date  09/18/20                 Plan - 08/07/20 4536    Clinical Impression Statement Pt. returns after nearly 2 week absence from therapy due to therapy on hold while getting Doppler to rule out DVT (with findings negative). She presents today with increased left knee stiffness from previous status with some setback from not stretching much during absence from therapy. Pain continues to be a limiting factor on exercise and activity progress and pt. also continues with gait impairments with continues need SPC for safety with gait. Plan resume/continue therapy for further progress to address remaining functional limitations.    Personal Factors and Comorbidities Comorbidity 1    Comorbidities arthritis    Examination-Activity Limitations Bathing;Locomotion Level;Stairs    Examination-Participation Restrictions Community Activity;Other    Stability/Clinical Decision Making Stable/Uncomplicated    Clinical Decision Making Low    Rehab Potential Good    PT Frequency 2x / week    PT Duration 6 weeks    PT Treatment/Interventions ADLs/Self Care Home Management;Electrical Stimulation;Ultrasound;DME Instruction;Gait training;Functional mobility training;Therapeutic exercise;Therapeutic activities;Stair training;Neuromuscular re-education;Patient/family education;Passive range of motion;Taping    PT Next Visit Plan continue to wok on stretching for flexion; continue progressing strengthening exercises; gait training to work on lateral shift    PT Home Exercise Plan heel slides with strap, seated scoot stretch, long sitting quad set, squat at counter, step ups, step stretch for knee flexion vs. seated knee flexion with RLE assist, Romberg and SLS at counter    Consulted and Agree with Plan of Care Patient           Patient will benefit from skilled therapeutic intervention in order to improve the following deficits and impairments:  Abnormal gait, Decreased range of motion, Pain, Decreased strength,  Decreased mobility, Decreased scar mobility, Increased muscle spasms  Visit Diagnosis: Acute pain of left knee  Stiffness of left knee, not elsewhere classified  Localized edema  Muscle weakness (generalized)  Other abnormalities of gait and mobility     Problem List Patient Active Problem List   Diagnosis Date Noted  . Arthritis of left knee 04/19/2020    Beaulah Dinning, PT, DPT 08/07/20 10:20 AM  [facility] [address] [place], [place], [address] Phone: [phone]   Fax:  [phone]  Name: Nancy Vasquez MRN: [id number] Date of Birth: 02-11-1941

## 2020-08-09 ENCOUNTER — Encounter: Payer: Self-pay | Admitting: Physical Therapy

## 2020-08-09 ENCOUNTER — Other Ambulatory Visit: Payer: Self-pay

## 2020-08-09 ENCOUNTER — Ambulatory Visit: Payer: Medicare Other | Admitting: Physical Therapy

## 2020-08-09 DIAGNOSIS — M25562 Pain in left knee: Secondary | ICD-10-CM

## 2020-08-09 DIAGNOSIS — M25662 Stiffness of left knee, not elsewhere classified: Secondary | ICD-10-CM

## 2020-08-09 DIAGNOSIS — R2689 Other abnormalities of gait and mobility: Secondary | ICD-10-CM

## 2020-08-09 DIAGNOSIS — R600 Localized edema: Secondary | ICD-10-CM

## 2020-08-09 DIAGNOSIS — M6281 Muscle weakness (generalized): Secondary | ICD-10-CM

## 2020-08-09 NOTE — Therapy (Signed)
[place], [place], [address] Phone: [phone]   Fax:  [phone]  Physical Therapy Treatment  Patient Details  Name: Nancy Vasquez MRN: [id number] Date of Birth: 1941-04-25 Referring Provider (PT): Dr Mittie Bodo   Encounter Date: 08/09/2020   PT End of Session - 08/09/20 0953    Visit Number 16    Number of Visits 26    Date for PT Re-Evaluation 09/18/20    Authorization Type BCBS MCR    Authorization Time Period next progress note at visit 25, add KX    PT Start Time 0928    PT Stop Time 1009    PT Time Calculation (min) 41 min    Activity Tolerance Patient tolerated treatment well    Behavior During Therapy [facility] for tasks assessed/performed           Past Medical History:  Diagnosis Date  . Arthritis    has had right hip replacement    Past Surgical History:  Procedure Laterality Date  . ABDOMINAL HYSTERECTOMY    . BREAST EXCISIONAL BIOPSY Left   . INTRAVASCULAR PRESSURE WIRE/FFR STUDY N/A 03/17/2017   Procedure: Intravascular Pressure Wire/FFR Study;  Surgeon: Charolette Forward, MD;  Location: [place] CV LAB;  Service: Cardiovascular;  Laterality: N/A;  mid LAD  . LEFT HEART CATH AND CORONARY ANGIOGRAPHY N/A 03/17/2017   Procedure: Left Heart Cath and Coronary Angiography;  Surgeon: Charolette Forward, MD;  Location: [place] CV LAB;  Service: Cardiovascular;  Laterality: N/A;  . TONSILLECTOMY     Removed as a child  . 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   Procedure: LEFT TOTAL KNEE ARTHROPLASTY;  Surgeon: Meredith Pel, MD;  Location: [place];  Service: Orthopedics;  Laterality: Left;    There were no vitals filed for this visit.   Subjective Assessment - 08/09/20 0930    Subjective Pt. reports legs feeling a little better today. Did some walking in pool yesterday which also helped.              [facility] PT Assessment - 08/09/20 0001      AROM   Left Knee Extension 2     Left Knee Flexion 97                         [facility] Adult PT Treatment/Exercise - 08/09/20 0001      Knee/Hip Exercises: Stretches   Gastroc Stretch Left;3 reps;20 seconds    Gastroc Stretch Limitations standing gastroc stretch at counter      Knee/Hip Exercises: Aerobic   Nustep LE only L4 x 5 min      Knee/Hip Exercises: Machines for Strengthening   Cybex Leg Press 45 lbs. 2x10 bilat. LE      Knee/Hip Exercises: Standing   Heel Raises Both;20 reps    Heel Raises Limitations Airex    Terminal Knee Extension AROM;Strengthening;Left;20 reps    Theraband Level (Terminal Knee Extension) Level 3 (Green)    Hip Abduction AROM;Stengthening;Right;Left;15 reps    Abduction Limitations 2 lbs. weight ea. leg bilat.    Lateral Step Up Left;2 sets;10 reps;Hand Hold: 2;Step Height: 4"    Forward Step Up Left;15 reps;Hand Hold: 2;Step Height: 6"    Functional Squat --    Functional Squat Limitations "touch and go" squat without UE support to high low table 2x10    Rocker Board 2 minutes    Rocker Board Limitations 1 min ea. dynamic  balance lateral and fw/rev    Other Standing Knee Exercises "church pews" x 15 reps standing in front of chair      Knee/Hip Exercises: Seated   Long Arc Quad AROM;Strengthening;Left;2 sets;10 reps    Long Arc Quad Weight 3 lbs.      Modalities   Modalities --   pt. declined cryo/vaso     Manual Therapy   Passive ROM left knee flexion in sitting with distraction and tibial IR, left knee extension in supine with femoral IR/tibial ER                    PT Short Term Goals - 08/07/20 1016      PT SHORT TERM GOAL #1   Title i with initial HEP for proprioception    Baseline met    Time 3    Period Weeks    Status Achieved      PT SHORT TERM GOAL #2   Title improve Lt knee flexion =/> 105 degrees to assist with stairs    Baseline 90 deg AROM 08/07/20    Time 3    Period Weeks    Status On-going    Target Date 08/28/20      PT  SHORT TERM GOAL #3   Title walk with normalized gait on even surfaces    Baseline still has an antalgic gait    Time 3    Period Weeks    Status On-going    Target Date 08/28/20             PT Long Term Goals - 08/07/20 1004      PT LONG TERM GOAL #1   Title i with advanced HEP to include return to gym    Baseline ongoing    Time 6    Period Weeks    Status On-going    Target Date 09/18/20      PT LONG TERM GOAL #2   Title improve Lt knee ext to flexion 0-120 to allow her to get on off the floor    Baseline 4-90 deg 08/07/20    Time 6    Period Weeks    Status On-going    Target Date 09/18/20      PT LONG TERM GOAL #3   Title improve Lt LE strength =/> 5-/5 to allow her to alternate steps on her stairs    Baseline left knee ext 4+/5, flex 5/5    Time 6    Period Weeks    Status Partially Met    Target Date 09/18/20      PT LONG TERM GOAL #5   Title report no more than 2/10 Lt knee pain at night to allow her to sleep per her previous level    Baseline 3/10 pre-tx., intermittent increased/higher pain level    Time 6    Period Weeks    Status On-going    Target Date 09/18/20                 Plan - 08/09/20 0948    Clinical Impression Statement Still with stiffness limiting left knee flexion ROM but showing improvement from status at visit earlier this week. Able to continue/progress closed chain strengthening with some balance challenges as noted per flowsheet with good tolerance in terms of pain. Some eccentric quad weakness still evident with stepdown motions.    Personal Factors and Comorbidities Comorbidity 1    Examination-Activity Limitations Bathing;Locomotion Level;Stairs    Examination-Participation  Restrictions Community Activity;Other    Stability/Clinical Decision Making Stable/Uncomplicated    Clinical Decision Making Low    Rehab Potential Good    PT Frequency 2x / week    PT Duration 6 weeks    PT Treatment/Interventions ADLs/Self Care Home  Management;Electrical Stimulation;Ultrasound;DME Instruction;Gait training;Functional mobility training;Therapeutic exercise;Therapeutic activities;Stair training;Neuromuscular re-education;Patient/family education;Passive range of motion;Taping    PT Next Visit Plan continue to work on stretching for flexion; continue progressing strengthening exercises; gait training to work on lateral shift    PT Home Exercise Plan heel slides with strap, seated scoot stretch, long sitting quad set, squat at counter, step ups, step stretch for knee flexion vs. seated knee flexion with RLE assist, Romberg and SLS at counter    Consulted and Agree with Plan of Care Patient           Patient will benefit from skilled therapeutic intervention in order to improve the following deficits and impairments:  Abnormal gait, Decreased range of motion, Pain, Decreased strength, Decreased mobility, Decreased scar mobility, Increased muscle spasms  Visit Diagnosis: Acute pain of left knee  Stiffness of left knee, not elsewhere classified  Localized edema  Muscle weakness (generalized)  Other abnormalities of gait and mobility     Problem List Patient Active Problem List   Diagnosis Date Noted  . Arthritis of left knee 04/19/2020    Beaulah Dinning, PT, DPT 08/09/20 10:12 AM  [facility] [address] [place], [place], [address] Phone: [phone]   Fax:  [phone]  Name: Nancy Vasquez MRN: [id number] Date of Birth: 11/06/1941

## 2020-08-20 ENCOUNTER — Encounter: Payer: Self-pay | Admitting: Physical Therapy

## 2020-08-20 ENCOUNTER — Ambulatory Visit: Payer: Medicare Other | Admitting: Physical Therapy

## 2020-08-20 ENCOUNTER — Other Ambulatory Visit: Payer: Self-pay

## 2020-08-20 DIAGNOSIS — R600 Localized edema: Secondary | ICD-10-CM

## 2020-08-20 DIAGNOSIS — M6281 Muscle weakness (generalized): Secondary | ICD-10-CM

## 2020-08-20 DIAGNOSIS — M25562 Pain in left knee: Secondary | ICD-10-CM | POA: Diagnosis not present

## 2020-08-20 DIAGNOSIS — R2689 Other abnormalities of gait and mobility: Secondary | ICD-10-CM

## 2020-08-20 DIAGNOSIS — M25662 Stiffness of left knee, not elsewhere classified: Secondary | ICD-10-CM

## 2020-08-20 NOTE — Therapy (Signed)
[place], [place], [address] Phone: [phone]   Fax:  [phone]  Physical Therapy Treatment  Patient Details  Name: Nancy Vasquez MRN: [id number] Date of Birth: 1941-02-06 Referring Provider (PT): Dr Mittie Bodo   Encounter Date: 08/20/2020   PT End of Session - 08/20/20 0944    Visit Number 17    Number of Visits 26    Date for PT Re-Evaluation 09/18/20    Authorization Type BCBS MCR    Authorization Time Period next progress note at visit 25, add KX    PT Start Time 0930    PT Stop Time 1014    PT Time Calculation (min) 44 min    Activity Tolerance Patient tolerated treatment well    Behavior During Therapy [facility] for tasks assessed/performed           Past Medical History:  Diagnosis Date  . Arthritis    has had right hip replacement    Past Surgical History:  Procedure Laterality Date  . ABDOMINAL HYSTERECTOMY    . BREAST EXCISIONAL BIOPSY Left   . INTRAVASCULAR PRESSURE WIRE/FFR STUDY N/A 03/17/2017   Procedure: Intravascular Pressure Wire/FFR Study;  Surgeon: Charolette Forward, MD;  Location: [place] [facility];  Service: Cardiovascular;  Laterality: N/A;  mid LAD  . LEFT HEART CATH AND CORONARY ANGIOGRAPHY N/A 03/17/2017   Procedure: Left Heart Cath and Coronary Angiography;  Surgeon: Charolette Forward, MD;  Location: [place] [facility];  Service: Cardiovascular;  Laterality: N/A;  . TONSILLECTOMY     Removed as a child  . 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   Procedure: LEFT TOTAL KNEE ARTHROPLASTY;  Surgeon: Meredith Pel, MD;  Location: [place];  Service: Orthopedics;  Laterality: Left;    There were no vitals filed for this visit.   Subjective Assessment - 08/20/20 0938    Subjective Patient reports pain in her posterior knee. She feels like her pain s better at noght. She is alos having pain in the lateral knee.    Patient Stated Goals bend her Lt knee and use it    Currently in  Pain? Yes    Pain Score 4     Pain Location Knee    Pain Orientation Left    Pain Descriptors / Indicators Aching    Pain Type Chronic pain    Pain Onset More than a month ago    Pain Frequency Intermittent    Aggravating Factors  lying down    Pain Relieving Factors tylenol and tramadol    Multiple Pain Sites No              [facility] PT Assessment - 08/20/20 0001      PROM   Left Knee Extension 5    Left Knee Flexion 100                         [facility] Adult PT Treatment/Exercise - 08/20/20 0001      Knee/Hip Exercises: Machines for Strengthening   Cybex Leg Press 45 lbs. 2x10 bilat. LE      Knee/Hip Exercises: Standing   Heel Raises Both;20 reps    Heel Raises Limitations Airex    Terminal Knee Extension AROM;Strengthening;Left;20 reps    Theraband Level (Terminal Knee Extension) Level 3 (Green)    Lateral Step Up Left;2 sets;10 reps;Hand Hold: 2;Step Height: 4"    Forward Step Up Left;15 reps;Hand Hold: 2;Step Height: 6"  Knee/Hip Exercises: Supine   Quad Sets 15 reps    Quad Sets Limitations 5 sec hold     Short Arc Quad Sets 2 sets;15 reps    Straight Leg Raises Limitations 2x15       Manual Therapy   Manual therapy comments sof ttissue mobilization to the IT band     Edema Management Edema massage to L knee    Joint Mobilization gentle PA and AP glides grade II and III     Passive ROM left knee flexion in sitting with distraction and tibial IR, left knee extension in supine with femoral IR/tibial ER                  PT Education - 08/20/20 0944    Education Details reviewed HEP and edema management    Person(s) Educated Patient    Methods Explanation;Demonstration;Tactile cues;Verbal cues    Comprehension Verbalized understanding;Returned demonstration;Verbal cues required;Tactile cues required            PT Short Term Goals - 08/07/20 1016      PT SHORT TERM GOAL #1   Title i with initial HEP for proprioception    Baseline  met    Time 3    Period Weeks    Status Achieved      PT SHORT TERM GOAL #2   Title improve Lt knee flexion =/> 105 degrees to assist with stairs    Baseline 90 deg AROM 08/07/20    Time 3    Period Weeks    Status On-going    Target Date 08/28/20      PT SHORT TERM GOAL #3   Title walk with normalized gait on even surfaces    Baseline still has an antalgic gait    Time 3    Period Weeks    Status On-going    Target Date 08/28/20             PT Long Term Goals - 08/07/20 1004      PT LONG TERM GOAL #1   Title i with advanced HEP to include return to gym    Baseline ongoing    Time 6    Period Weeks    Status On-going    Target Date 09/18/20      PT LONG TERM GOAL #2   Title improve Lt knee ext to flexion 0-120 to allow her to get on off the floor    Baseline 4-90 deg 08/07/20    Time 6    Period Weeks    Status On-going    Target Date 09/18/20      PT LONG TERM GOAL #3   Title improve Lt LE strength =/> 5-/5 to allow her to alternate steps on her stairs    Baseline left knee ext 4+/5, flex 5/5    Time 6    Period Weeks    Status Partially Met    Target Date 09/18/20      PT LONG TERM GOAL #5   Title report no more than 2/10 Lt knee pain at night to allow her to sleep per her previous level    Baseline 3/10 pre-tx., intermittent increased/higher pain level    Time 6    Period Weeks    Status On-going    Target Date 09/18/20                 Plan - 08/20/20 0955    Clinical Impression Statement Patients range is  moving back in the right dtriection. her passive range was measured at 2-100 degrees today. She had tightness in her IT band. zHer knee tolerated PROM better today. She had no significant increase in pain with ther-ex. pT will continue to advance patient as tolerated.    Personal Factors and Comorbidities Comorbidity 1    Examination-Activity Limitations Bathing;Locomotion Level;Stairs    Examination-Participation Restrictions Community  Activity;Other    Stability/Clinical Decision Making Stable/Uncomplicated    Clinical Decision Making Low    Rehab Potential Good    PT Frequency 2x / week    PT Duration 6 weeks    PT Treatment/Interventions ADLs/Self Care Home Management;Electrical Stimulation;Ultrasound;DME Instruction;Gait training;Functional mobility training;Therapeutic exercise;Therapeutic activities;Stair training;Neuromuscular re-education;Patient/family education;Passive range of motion;Taping    PT Next Visit Plan continue to work on stretching for flexion; continue progressing strengthening exercises; gait training to work on lateral shift    PT Home Exercise Plan heel slides with strap, seated scoot stretch, long sitting quad set, squat at counter, step ups, step stretch for knee flexion vs. seated knee flexion with RLE assist, Romberg and SLS at counter    Consulted and Agree with Plan of Care Patient           Patient will benefit from skilled therapeutic intervention in order to improve the following deficits and impairments:  Abnormal gait, Decreased range of motion, Pain, Decreased strength, Decreased mobility, Decreased scar mobility, Increased muscle spasms  Visit Diagnosis: Acute pain of left knee  Stiffness of left knee, not elsewhere classified  Localized edema  Muscle weakness (generalized)  Other abnormalities of gait and mobility     Problem List Patient Active Problem List   Diagnosis Date Noted  . Arthritis of left knee 04/19/2020    Carney Living PT DPT  08/20/2020, 1:52 PM  [facility] [address] [place], [place], [address] Phone: [phone]   Fax:  [phone]  Name: Nancy Vasquez MRN: [id number] Date of Birth: 1941/10/30

## 2020-08-22 ENCOUNTER — Encounter: Payer: Self-pay | Admitting: Physical Therapy

## 2020-08-22 ENCOUNTER — Ambulatory Visit: Payer: Medicare Other | Admitting: Physical Therapy

## 2020-08-22 ENCOUNTER — Other Ambulatory Visit: Payer: Self-pay

## 2020-08-22 DIAGNOSIS — M25562 Pain in left knee: Secondary | ICD-10-CM | POA: Diagnosis not present

## 2020-08-22 DIAGNOSIS — M25662 Stiffness of left knee, not elsewhere classified: Secondary | ICD-10-CM

## 2020-08-22 DIAGNOSIS — M6281 Muscle weakness (generalized): Secondary | ICD-10-CM

## 2020-08-22 DIAGNOSIS — R2689 Other abnormalities of gait and mobility: Secondary | ICD-10-CM

## 2020-08-22 DIAGNOSIS — R600 Localized edema: Secondary | ICD-10-CM

## 2020-08-22 NOTE — Therapy (Signed)
[place], [place], [address] Phone: [phone]   Fax:  [phone]  Physical Therapy Treatment  Patient Details  Name: Nancy Vasquez MRN: [id number] Date of Birth: 1941/06/30 Referring Provider (PT): Dr Mittie Bodo   Encounter Date: 08/22/2020   PT End of Session - 08/22/20 0946    Visit Number 18    Number of Visits 26    Date for PT Re-Evaluation 09/18/20    Authorization Type BCBS MCR    Authorization Time Period next progress note at visit 25, add KX    PT Start Time [id number]   Patient 7 minutes late   PT Stop Time 1015    PT Time Calculation (min) 38 min    Activity Tolerance Patient tolerated treatment well    Behavior During Therapy [facility] for tasks assessed/performed           Past Medical History:  Diagnosis Date  . Arthritis    has had right hip replacement    Past Surgical History:  Procedure Laterality Date  . ABDOMINAL HYSTERECTOMY    . BREAST EXCISIONAL BIOPSY Left   . INTRAVASCULAR PRESSURE WIRE/FFR STUDY N/A 03/17/2017   Procedure: Intravascular Pressure Wire/FFR Study;  Surgeon: Charolette Forward, MD;  Location: [place] [facility];  Service: Cardiovascular;  Laterality: N/A;  mid LAD  . LEFT HEART CATH AND CORONARY ANGIOGRAPHY N/A 03/17/2017   Procedure: Left Heart Cath and Coronary Angiography;  Surgeon: Charolette Forward, MD;  Location: [place] [facility];  Service: Cardiovascular;  Laterality: N/A;  . TONSILLECTOMY     Removed as a child  . 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   Procedure: LEFT TOTAL KNEE ARTHROPLASTY;  Surgeon: Meredith Pel, MD;  Location: [place];  Service: Orthopedics;  Laterality: Left;    There were no vitals filed for this visit.   Subjective Assessment - 08/22/20 0944    Subjective Patient reports the prior to last visit she had sat and twisted her knee which increased her pain. She reports it is  a little sore today but overall it is doing well.     Patient Stated Goals bend her Lt knee and use it    Currently in Pain? Yes    Pain Score 4     Pain Location Knee    Pain Orientation Left    Pain Descriptors / Indicators Aching    Pain Type Chronic pain    Pain Onset More than a month ago    Pain Frequency Intermittent    Aggravating Factors  lying down    Pain Relieving Factors tylenol/ tramadol    Multiple Pain Sites No              [facility] PT Assessment - 08/22/20 0001      PROM   Left Knee Extension 0    Left Knee Flexion 106                         [facility] Adult PT Treatment/Exercise - 08/22/20 0001      Knee/Hip Exercises: Aerobic   Nustep LE only L4 x 5 min      Knee/Hip Exercises: Standing   Lateral Step Up Left;2 sets;10 reps;Hand Hold: 2;Step Height: 4"    Forward Step Up Left;15 reps;Hand Hold: 2;Step Height: 6"    Step Down 15 reps    Functional Squat Limitations x2x15    Other Standing Knee Exercises Standing march x20  Knee/Hip Exercises: Supine   Quad Sets 15 reps    Short Arc Quad Sets 2 sets;15 reps    Straight Leg Raises Limitations 2x15       Manual Therapy   Manual therapy comments sof ttissue mobilization to the IT band     Edema Management Edema massage to L knee    Joint Mobilization gentle PA and AP glides grade II and III     Passive ROM left knee flexion in sitting with distraction and tibial IR, left knee extension in supine with femoral IR/tibial ER                  PT Education - 08/22/20 0945    Education Details reviewed HEP and symptom mangement    Person(s) Educated Patient    Methods Explanation;Tactile cues;Demonstration;Verbal cues    Comprehension Verbalized understanding;Returned demonstration;Verbal cues required;Tactile cues required            PT Short Term Goals - 08/22/20 1002      PT SHORT TERM GOAL #1   Title i with initial HEP for proprioception    Baseline met    Time 3    Period Weeks    Status Achieved    Target Date 06/13/20       PT SHORT TERM GOAL #2   Title improve Lt knee flexion =/> 105 degrees to assist with stairs    Baseline 90 deg AROM 08/07/20    Time 3    Period Weeks    Status Achieved    Target Date 08/28/20      PT SHORT TERM GOAL #3   Title walk with normalized gait on even surfaces    Baseline still has an antalgic gait    Time 3    Period Weeks    Status Achieved    Target Date 08/28/20             PT Long Term Goals - 08/07/20 1004      PT LONG TERM GOAL #1   Title i with advanced HEP to include return to gym    Baseline ongoing    Time 6    Period Weeks    Status On-going    Target Date 09/18/20      PT LONG TERM GOAL #2   Title improve Lt knee ext to flexion 0-120 to allow her to get on off the floor    Baseline 4-90 deg 08/07/20    Time 6    Period Weeks    Status On-going    Target Date 09/18/20      PT LONG TERM GOAL #3   Title improve Lt LE strength =/> 5-/5 to allow her to alternate steps on her stairs    Baseline left knee ext 4+/5, flex 5/5    Time 6    Period Weeks    Status Partially Met    Target Date 09/18/20      PT LONG TERM GOAL #5   Title report no more than 2/10 Lt knee pain at night to allow her to sleep per her previous level    Baseline 3/10 pre-tx., intermittent increased/higher pain level    Time 6    Period Weeks    Status On-going    Target Date 09/18/20                 Plan - 08/22/20 0955    Clinical Impression Statement Patients PROM has progressed. Her range was  measured at 0-116 today. That is a significant increase compared to last visit. Patient worked on gait without a device but continues to require min guard 2nd to instability. She has 1 more visit schedueld. She is approaching her max benefit for therapy. We would like to progress her from the cane but it may take long term wrok on her HEP before she can achieve She is not using the cane indoors. Next visit we will review HEP with likley discharge. Patient encouraged to  keep stretching her knee at home.    Personal Factors and Comorbidities Comorbidity 1    Comorbidities arthritis    Examination-Activity Limitations Bathing;Locomotion Level;Stairs    Stability/Clinical Decision Making Stable/Uncomplicated    Clinical Decision Making Low    PT Frequency 2x / week    PT Duration 6 weeks    PT Treatment/Interventions ADLs/Self Care Home Management;Electrical Stimulation;Ultrasound;DME Instruction;Gait training;Functional mobility training;Therapeutic exercise;Therapeutic activities;Stair training;Neuromuscular re-education;Patient/family education;Passive range of motion;Taping    PT Next Visit Plan FOTO, review final HEP;    PT Home Exercise Plan heel slides with strap, seated scoot stretch, long sitting quad set, squat at counter, step ups, step stretch for knee flexion vs. seated knee flexion with RLE assist, Romberg and SLS at counter    Consulted and Agree with Plan of Care Patient           Patient will benefit from skilled therapeutic intervention in order to improve the following deficits and impairments:  Abnormal gait, Decreased range of motion, Pain, Decreased strength, Decreased mobility, Decreased scar mobility, Increased muscle spasms  Visit Diagnosis: Acute pain of left knee  Stiffness of left knee, not elsewhere classified  Localized edema  Muscle weakness (generalized)  Other abnormalities of gait and mobility     Problem List Patient Active Problem List   Diagnosis Date Noted  . Arthritis of left knee 04/19/2020    Carney Living PT DPT 08/22/2020, 10:42 AM  [facility] [address] [place], [place], [address] Phone: [phone]   Fax:  [phone]  Name: Nancy Vasquez MRN: [id number] Date of Birth: 12-16-1941

## 2020-08-24 ENCOUNTER — Other Ambulatory Visit: Payer: Self-pay | Admitting: Family Medicine

## 2020-08-24 DIAGNOSIS — Z1231 Encounter for screening mammogram for malignant neoplasm of breast: Secondary | ICD-10-CM

## 2020-08-27 ENCOUNTER — Ambulatory Visit: Payer: Medicare Other | Admitting: Orthopedic Surgery

## 2020-08-27 ENCOUNTER — Encounter: Payer: Self-pay | Admitting: Orthopedic Surgery

## 2020-08-27 VITALS — Ht 64.75 in | Wt 166.0 lb

## 2020-08-27 DIAGNOSIS — Z96652 Presence of left artificial knee joint: Secondary | ICD-10-CM

## 2020-08-28 ENCOUNTER — Other Ambulatory Visit: Payer: Self-pay

## 2020-08-28 ENCOUNTER — Encounter: Payer: Self-pay | Admitting: Physical Therapy

## 2020-08-28 ENCOUNTER — Ambulatory Visit: Payer: Medicare Other | Admitting: Physical Therapy

## 2020-08-28 DIAGNOSIS — M25662 Stiffness of left knee, not elsewhere classified: Secondary | ICD-10-CM

## 2020-08-28 DIAGNOSIS — M25562 Pain in left knee: Secondary | ICD-10-CM

## 2020-08-28 DIAGNOSIS — M6281 Muscle weakness (generalized): Secondary | ICD-10-CM

## 2020-08-28 DIAGNOSIS — R600 Localized edema: Secondary | ICD-10-CM

## 2020-08-28 DIAGNOSIS — R2689 Other abnormalities of gait and mobility: Secondary | ICD-10-CM

## 2020-08-28 NOTE — Therapy (Addendum)
[place], [place], [address] Phone: [phone]   Fax:  [phone]  Physical Therapy Treatment/Discharge   Patient Details  Name: Nancy Vasquez MRN: [id number] Date of Birth: 10-30-1941 Referring Provider (PT): Dr Mittie Bodo   Encounter Date: 08/28/2020   PT End of Session - 08/28/20 0941    Visit Number 19    Number of Visits 26    Date for PT Re-Evaluation 09/18/20    Authorization Type BCBS MCR    Authorization Time Period next progress note at visit 25, add KX    PT Start Time 0935   Patient was 5 minutes late   PT Stop Time 1015    PT Time Calculation (min) 40 min    Activity Tolerance Patient tolerated treatment well    Behavior During Therapy [facility] for tasks assessed/performed           Past Medical History:  Diagnosis Date  . Arthritis    has had right hip replacement    Past Surgical History:  Procedure Laterality Date  . ABDOMINAL HYSTERECTOMY    . BREAST EXCISIONAL BIOPSY Left   . INTRAVASCULAR PRESSURE WIRE/FFR STUDY N/A 03/17/2017   Procedure: Intravascular Pressure Wire/FFR Study;  Surgeon: Charolette Forward, MD;  Location: [place] [facility];  Service: Cardiovascular;  Laterality: N/A;  mid LAD  . LEFT HEART CATH AND CORONARY ANGIOGRAPHY N/A 03/17/2017   Procedure: Left Heart Cath and Coronary Angiography;  Surgeon: Charolette Forward, MD;  Location: [place] [facility];  Service: Cardiovascular;  Laterality: N/A;  . TONSILLECTOMY     Removed as a child  . 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   Procedure: LEFT TOTAL KNEE ARTHROPLASTY;  Surgeon: Meredith Pel, MD;  Location: [place];  Service: Orthopedics;  Laterality: Left;    There were no vitals filed for this visit.   Subjective Assessment - 08/28/20 0939    Subjective Patient has been to the MD. He has released her. She is not hsaving any pain this morning. It was a little sensative tis morning when she woke up per the patient.     Patient Stated Goals bend her Lt knee and use it    Currently in Pain? No/denies                             [facility] Adult PT Treatment/Exercise - 08/28/20 1527      Self-Care   Other Self-Care Comments  reviewed how to progress home program and the improtance of ragaining motion       Knee/Hip Exercises: Stretches   Other Knee/Hip Stretches rreviewed self stretching supine and standing with a step      Knee/Hip Exercises: Standing   Heel Raises Both;20 reps    Heel Raises Limitations split stance    Lateral Step Up Left;2 sets;10 reps;Hand Hold: 2;Step Height: 4"    Forward Step Up Left;15 reps;Hand Hold: 2;Step Height: 6"    Step Down 15 reps    Functional Squat Limitations 2x15    Other Standing Knee Exercises Standing march x20       Knee/Hip Exercises: Supine   Quad Sets 15 reps    Quad Sets Limitations reviewed for home program     Short Arc Quad Sets 2 sets;15 reps    Straight Leg Raises Limitations 2x15       Manual Therapy   Manual therapy comments sof ttissue mobilization to  the IT band     Joint Mobilization gentle PA and AP glides grade II and III     Passive ROM left knee flexion in sitting with distraction and tibial IR, left knee extension in supine with femoral IR/tibial ER                  PT Education - 08/28/20 0940    Education Details reviewed ther-ex and final HEP    Person(s) Educated Patient    Methods Explanation;Tactile cues;Demonstration    Comprehension Verbalized understanding;Returned demonstration;Verbal cues required;Tactile cues required            PT Short Term Goals - 08/28/20 1516      PT SHORT TERM GOAL #1   Title i with initial HEP for proprioception    Baseline met    Time 3    Period Weeks    Status Achieved    Target Date 06/13/20      PT SHORT TERM GOAL #2   Title improve Lt knee flexion =/> 105 degrees to assist with stairs    Baseline has reached 116    Time 3    Period Weeks    Status  Achieved    Target Date 08/28/20      PT SHORT TERM GOAL #3   Title walk with normalized gait on even surfaces    Baseline still using a single point cane but improved gait pattern    Time 3    Period Weeks    Status Achieved    Target Date 08/28/20             PT Long Term Goals - 08/28/20 1518      PT LONG TERM GOAL #1   Title i with advanced HEP to include return to gym    Baseline has not returned to gym 2nd to covid but could    Time 6    Period Weeks    Status Achieved      PT LONG TERM GOAL #2   Title improve Lt knee ext to flexion 0-120 to allow her to get on off the floor    Baseline 0-116    Time 6    Period Weeks    Status Achieved      PT LONG TERM GOAL #3   Title improve Lt LE strength =/> 5-/5 to allow her to alternate steps on her stairs    Baseline left knee ext 4+/5, flex 5/5    Time 6    Status Partially Met      PT LONG TERM GOAL #4   Title improve FOTO =/< 29% limited    Baseline 13% limited    Time 6    Period Weeks    Status Achieved      PT LONG TERM GOAL #5   Title report no more than 2/10 Lt knee pain at night to allow her to sleep per her previous level    Baseline <2/10 sleeping much better    Time 6    Period Weeks    Status Achieved                 Plan - 08/28/20 1509    Clinical Impression Statement The patient has made good progress. Her rnage still needs work but she has a good home program to continue working on her rang. Her range was more limited today. Therapy reviewed HEP for home. Therapy also reviewed squatting technique with the patient.  The patient was advised to continue to work on her range and strengthening consistently at home as she still has strength and mobility deficits. See below for goal specific progress.    Personal Factors and Comorbidities Comorbidity 1    Comorbidities arthritis    Examination-Activity Limitations Bathing;Locomotion Level;Stairs    Examination-Participation Restrictions  Community Activity;Other    Stability/Clinical Decision Making Stable/Uncomplicated    Clinical Decision Making Low    Rehab Potential Good    PT Frequency 2x / week    PT Duration 6 weeks    PT Treatment/Interventions ADLs/Self Care Home Management;Electrical Stimulation;Ultrasound;DME Instruction;Gait training;Functional mobility training;Therapeutic exercise;Therapeutic activities;Stair training;Neuromuscular re-education;Patient/family education;Passive range of motion;Taping    PT Next Visit Plan FOTO, review final HEP;    PT Home Exercise Plan heel slides with strap, seated scoot stretch, long sitting quad set, squat at counter, step ups, step stretch for knee flexion vs. seated knee flexion with RLE assist, Romberg and SLS at counter    Consulted and Agree with Plan of Care Patient           Patient will benefit from skilled therapeutic intervention in order to improve the following deficits and impairments:  Abnormal gait, Decreased range of motion, Pain, Decreased strength, Decreased mobility, Decreased scar mobility, Increased muscle spasms  Visit Diagnosis: Acute pain of left knee  Stiffness of left knee, not elsewhere classified  Localized edema  Muscle weakness (generalized)  Other abnormalities of gait and mobility   PHYSICAL THERAPY DISCHARGE SUMMARY  Visits from Start of Care: 19  Current functional level related to goals / functional outcomes: Improved ability to ambulate with a cane. Improved pain. improved ability to sleep   Remaining deficits: Continued limitations in motion and ability to walk without a cane    Education / Equipment: HEP   Plan:                                                    Patient goals were partially met. Patient is being discharged due to meeting the stated rehab goals.  ?????       Problem List Patient Active Problem List   Diagnosis Date Noted  . Arthritis of left knee 04/19/2020    Carney Living  PT DPT    08/28/2020, 3:31 PM  [facility] [address] [place], [place], [address] Phone: [phone]   Fax:  [phone]  Name: ARDYCE HEYER MRN: [id number] Date of Birth: 03/01/1941

## 2020-09-02 ENCOUNTER — Encounter: Payer: Self-pay | Admitting: Orthopedic Surgery

## 2020-09-02 NOTE — Progress Notes (Signed)
Office Visit Note   Patient: Nancy Vasquez           Date of Birth: February 21, 1941           MRN: [id number] Visit Date: 08/27/2020 Requested by: Macy Mis, MD [address] [place],  [place] [address] PCP: Macy Mis, MD  Subjective: Chief Complaint  Patient presents with  . Left Knee - Follow-up    04/19/2020 Left TKA    HPI: Nancy Vasquez is a 79 y.o. female who presents to the office complaining of left knee pain.  Patient is s/p left total knee arthroplasty on 04/19/2020.  She notes that her pain is "100% better" since last office visit.  She is ambulating with a cane.  She still has a little bit of pain/soreness that is present in the morning but this improves throughout the day.  She denies any fevers, chills, night sweats.  She takes tramadol on occasion with good relief but she has not had to take it in several weeks..                ROS: All systems reviewed are negative as they relate to the chief complaint within the history of present illness.  Patient denies fevers or chills.  Assessment & Plan: Visit Diagnoses:  1. Status post total left knee replacement     Plan: Patient is a 79 year old female presents complaining of left knee pain.  She has had significant relief of pain in the last several weeks.  No sign of infection judging by her history or exam.  She is doing well about 4 months out from knee arthroplasty.  Plan for patient to follow-up as needed.  She will call the office for antibiotic prophylaxis if she has any upcoming dental procedures.  Patient agreed with this plan.  Follow-Up Instructions: No follow-ups on file.   Orders:  No orders of the defined types were placed in this encounter.  No orders of the defined types were placed in this encounter.     Procedures: No procedures performed   Clinical Data: No additional findings.  Objective: Vital Signs: Ht 5' 4.75" (1.645 m)   Wt 166 lb (75.3 kg)   BMI 27.84 kg/m     Physical Exam:  Constitutional: Patient appears well-developed HEENT:  Head: Normocephalic Eyes:EOM are normal Neck: Normal range of motion Cardiovascular: Normal rate Pulmonary/chest: Effort normal Neurologic: Patient is alert Skin: Skin is warm Psychiatric: Patient has normal mood and affect  Ortho Exam: Ortho exam demonstrates left knee with healed incision from prior total knee arthroplasty.  No evidence of infection or dehiscence.  No calf tenderness on exam.  Negative Homans' sign.  0 to 5 degrees of extension with 105 degrees of flexion.  No knee effusion present.  Specialty Comments:  No specialty comments available.  Imaging: No results found.   PMFS History: Patient Active Problem List   Diagnosis Date Noted  . Arthritis of left knee 04/19/2020   Past Medical History:  Diagnosis Date  . Arthritis    has had right hip replacement    Family History  Problem Relation Age of Onset  . Breast cancer Neg Hx     Past Surgical History:  Procedure Laterality Date  . ABDOMINAL HYSTERECTOMY    . BREAST EXCISIONAL BIOPSY Left   . INTRAVASCULAR PRESSURE WIRE/FFR STUDY N/A 03/17/2017   Procedure: Intravascular Pressure Wire/FFR Study;  Surgeon: Rinaldo Cloud, MD;  Location: [facility] INVASIVE CV LAB;  Service: Cardiovascular;  Laterality: N/A;  mid LAD  . LEFT HEART CATH AND CORONARY ANGIOGRAPHY N/A 03/17/2017   Procedure: Left Heart Cath and Coronary Angiography;  Surgeon: Rinaldo Cloud, MD;  Location: [facility] INVASIVE CV LAB;  Service: Cardiovascular;  Laterality: N/A;  . TONSILLECTOMY     Removed as a child  . 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   Procedure: LEFT TOTAL KNEE ARTHROPLASTY;  Surgeon: Cammy Copa, MD;  Location: [facility] OR;  Service: Orthopedics;  Laterality: Left;   Social History   Occupational History  . Not on file  Tobacco Use  . Smoking status: Never Smoker  . Smokeless tobacco: Never Used  Substance and Sexual Activity  . Alcohol use: Never  .  Drug use: Never  . Sexual activity: Not on file

## 2020-09-24 ENCOUNTER — Ambulatory Visit
Admission: RE | Admit: 2020-09-24 | Discharge: 2020-09-24 | Disposition: A | Discharge status: Home / Self Care | Payer: Medicare Other | Source: Ambulatory Visit | Attending: Family Medicine | Admitting: Family Medicine

## 2020-09-24 ENCOUNTER — Ambulatory Visit: Payer: Medicare Other

## 2020-09-24 ENCOUNTER — Other Ambulatory Visit: Payer: Self-pay

## 2020-09-24 DIAGNOSIS — Z1231 Encounter for screening mammogram for malignant neoplasm of breast: Secondary | ICD-10-CM

## 2020-11-28 ENCOUNTER — Ambulatory Visit: Payer: Medicare Other | Admitting: Orthopedic Surgery

## 2020-11-28 ENCOUNTER — Encounter: Payer: Self-pay | Admitting: Orthopedic Surgery

## 2020-11-28 DIAGNOSIS — Z96652 Presence of left artificial knee joint: Secondary | ICD-10-CM | POA: Diagnosis not present

## 2020-11-28 NOTE — Progress Notes (Signed)
Office Visit Note   Patient: Nancy Vasquez           Date of Birth: Dec 05, 1941           MRN: [id number] Visit Date: 11/28/2020 Requested by: Macy Mis, MD [address] [place],  [place] [address] PCP: Macy Mis, MD  Subjective: Chief Complaint  Patient presents with  . Left Leg - Edema    HPI: Nancy Vasquez is a 79 y.o. female who presents to the office complaining of left knee pain and left leg swelling.  Patient is s/p left total knee arthroplasty on 04/19/2020.  She has been doing well for several months but she does note in the last month she developed increasing posterior knee pain that lasted for about 4 weeks before resolving last week.  She notes swelling in her left calf that comes and goes since her last office visit.  She denies any significant calf pain.  She is compliant with taking aspirin once per day.  She is ambulating with a cane.  No injury to the left knee.  Denies any fevers, chills, night sweats, drainage from the incision, redness around the incision..                ROS: All systems reviewed are negative as they relate to the chief complaint within the history of present illness.  Patient denies fevers or chills.  Assessment & Plan: Visit Diagnoses:  1. Status post total left knee replacement     Plan: Patient is a 79 year old female presents complaining of left leg swelling s/p left total knee arthroplasty on 04/19/2020.  She did have some posterior knee pain that bothered her for about a month but has now resolved as of last week.  She has no calf tenderness on exam and a negative Denna Haggard' sign though she does have very mildly increased calf circumference compared with the right side.  Nothing that seems concerning given the fact that her calf is not tender, her knee is stable, her range of motion is equivalent to her last visit, no signs of infection.  She is planning on moving to [place] early next year.  Patient was given a  copy of her last set of radiographs and she will follow-up in the office as needed.  Follow-Up Instructions: No follow-ups on file.   Orders:  No orders of the defined types were placed in this encounter.  No orders of the defined types were placed in this encounter.     Procedures: No procedures performed   Clinical Data: No additional findings.  Objective: Vital Signs: There were no vitals taken for this visit.  Physical Exam:  Constitutional: Patient appears well-developed HEENT:  Head: Normocephalic Eyes:EOM are normal Neck: Normal range of motion Cardiovascular: Normal rate Pulmonary/chest: Effort normal Neurologic: Patient is alert Skin: Skin is warm Psychiatric: Patient has normal mood and affect  Ortho Exam: Ortho exam demonstrates left knee with 0 to 5 degrees of extension and greater than 90 degrees of flexion.  Incision is well-healed without any evidence of infection or dehiscence.  No calf tenderness.  Negative Homans' sign.  Left calf with circumference of 39 cm proximally, 37.5 cm mid calf, 23 cm just above the ankle.  This is compared with the right calf of 38 cm proximally, 38 cm mid calf, 22 cm just above the ankle.  No warmth around the left knee.  No effusion of the left knee.  No pain with  passive range of motion of the left knee.  Specialty Comments:  No specialty comments available.  Imaging: No results found.   PMFS History: Patient Active Problem List   Diagnosis Date Noted  . Arthritis of left knee 04/19/2020   Past Medical History:  Diagnosis Date  . Arthritis    has had right hip replacement    Family History  Problem Relation Age of Onset  . Breast cancer Sister 76       metastatic breast ca    Past Surgical History:  Procedure Laterality Date  . ABDOMINAL HYSTERECTOMY    . BREAST EXCISIONAL BIOPSY Left   . INTRAVASCULAR PRESSURE WIRE/FFR STUDY N/A 03/17/2017   Procedure: Intravascular Pressure Wire/FFR Study;  Surgeon: Rinaldo Cloud, MD;  Location: [facility] INVASIVE CV LAB;  Service: Cardiovascular;  Laterality: N/A;  mid LAD  . LEFT HEART CATH AND CORONARY ANGIOGRAPHY N/A 03/17/2017   Procedure: Left Heart Cath and Coronary Angiography;  Surgeon: Rinaldo Cloud, MD;  Location: [facility] INVASIVE CV LAB;  Service: Cardiovascular;  Laterality: N/A;  . TONSILLECTOMY     Removed as a child  . 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   Procedure: LEFT TOTAL KNEE ARTHROPLASTY;  Surgeon: Cammy Copa, MD;  Location: [facility] OR;  Service: Orthopedics;  Laterality: Left;   Social History   Occupational History  . Not on file  Tobacco Use  . Smoking status: Never Smoker  . Smokeless tobacco: Never Used  Substance and Sexual Activity  . Alcohol use: Never  . Drug use: Never  . Sexual activity: Not on file

## 2021-01-25 ENCOUNTER — Ambulatory Visit: Payer: Medicare Other | Admitting: Surgical

## 2021-06-04 DIAGNOSIS — U071 COVID-19: Secondary | ICD-10-CM | POA: Diagnosis not present

## 2021-06-28 DIAGNOSIS — I10 Essential (primary) hypertension: Secondary | ICD-10-CM | POA: Diagnosis not present

## 2021-06-28 DIAGNOSIS — E785 Hyperlipidemia, unspecified: Secondary | ICD-10-CM | POA: Diagnosis not present

## 2021-06-28 DIAGNOSIS — I2510 Atherosclerotic heart disease of native coronary artery without angina pectoris: Secondary | ICD-10-CM | POA: Diagnosis not present

## 2021-06-28 DIAGNOSIS — R0789 Other chest pain: Secondary | ICD-10-CM | POA: Diagnosis not present

## 2021-07-03 DIAGNOSIS — R5383 Other fatigue: Secondary | ICD-10-CM | POA: Diagnosis not present

## 2021-07-03 DIAGNOSIS — R0989 Other specified symptoms and signs involving the circulatory and respiratory systems: Secondary | ICD-10-CM | POA: Diagnosis not present

## 2021-07-03 DIAGNOSIS — Z8616 Personal history of COVID-19: Secondary | ICD-10-CM | POA: Diagnosis not present

## 2021-08-26 DIAGNOSIS — R059 Cough, unspecified: Secondary | ICD-10-CM | POA: Diagnosis not present

## 2021-08-26 DIAGNOSIS — R062 Wheezing: Secondary | ICD-10-CM | POA: Diagnosis not present

## 2021-08-26 DIAGNOSIS — R8290 Unspecified abnormal findings in urine: Secondary | ICD-10-CM | POA: Diagnosis not present

## 2021-09-04 ENCOUNTER — Other Ambulatory Visit: Payer: Self-pay | Admitting: Family Medicine

## 2021-09-04 DIAGNOSIS — Z1231 Encounter for screening mammogram for malignant neoplasm of breast: Secondary | ICD-10-CM

## 2021-09-09 ENCOUNTER — Telehealth: Payer: Self-pay | Admitting: Orthopedic Surgery

## 2021-09-09 NOTE — Telephone Encounter (Signed)
Pt would like someone to return her call back.  cB [phone]

## 2021-09-10 NOTE — Telephone Encounter (Signed)
Looks good. thx!

## 2021-09-10 NOTE — Telephone Encounter (Signed)
Contacted patient and she states that she has been having slight warm and burning sensations to her left knee. She states that she has no tightness just slight stiffness noticed more in the morning. Patient has had previous L TKA preformed on 04/19/2021. She has been scheduled for Friday 09/13/2021 for evaluation. Please advise if anything additional is needed.

## 2021-09-11 DIAGNOSIS — M8580 Other specified disorders of bone density and structure, unspecified site: Secondary | ICD-10-CM | POA: Diagnosis not present

## 2021-09-11 DIAGNOSIS — E559 Vitamin D deficiency, unspecified: Secondary | ICD-10-CM | POA: Diagnosis not present

## 2021-09-11 DIAGNOSIS — Z0000 Encounter for general adult medical examination without abnormal findings: Secondary | ICD-10-CM | POA: Diagnosis not present

## 2021-09-11 DIAGNOSIS — Z1211 Encounter for screening for malignant neoplasm of colon: Secondary | ICD-10-CM | POA: Diagnosis not present

## 2021-09-11 DIAGNOSIS — I2510 Atherosclerotic heart disease of native coronary artery without angina pectoris: Secondary | ICD-10-CM | POA: Diagnosis not present

## 2021-09-13 ENCOUNTER — Other Ambulatory Visit: Payer: Self-pay | Admitting: Family Medicine

## 2021-09-13 ENCOUNTER — Ambulatory Visit: Payer: Medicare Other | Admitting: Surgical

## 2021-09-13 DIAGNOSIS — M8580 Other specified disorders of bone density and structure, unspecified site: Secondary | ICD-10-CM

## 2021-09-19 DIAGNOSIS — M8589 Other specified disorders of bone density and structure, multiple sites: Secondary | ICD-10-CM | POA: Diagnosis not present

## 2021-09-19 DIAGNOSIS — M8580 Other specified disorders of bone density and structure, unspecified site: Secondary | ICD-10-CM | POA: Diagnosis not present

## 2021-09-25 ENCOUNTER — Other Ambulatory Visit: Payer: Self-pay

## 2021-09-25 ENCOUNTER — Ambulatory Visit
Admission: RE | Admit: 2021-09-25 | Discharge: 2021-09-25 | Disposition: A | Discharge status: Home / Self Care | Payer: Medicare Other | Source: Ambulatory Visit | Attending: Family Medicine | Admitting: Family Medicine

## 2021-09-25 DIAGNOSIS — Z1231 Encounter for screening mammogram for malignant neoplasm of breast: Secondary | ICD-10-CM

## 2021-09-27 DIAGNOSIS — I2510 Atherosclerotic heart disease of native coronary artery without angina pectoris: Secondary | ICD-10-CM | POA: Diagnosis not present

## 2021-09-27 DIAGNOSIS — R0789 Other chest pain: Secondary | ICD-10-CM | POA: Diagnosis not present

## 2021-09-27 DIAGNOSIS — I10 Essential (primary) hypertension: Secondary | ICD-10-CM | POA: Diagnosis not present

## 2021-09-27 DIAGNOSIS — E785 Hyperlipidemia, unspecified: Secondary | ICD-10-CM | POA: Diagnosis not present

## 2021-10-11 DIAGNOSIS — Z23 Encounter for immunization: Secondary | ICD-10-CM | POA: Diagnosis not present

## 2021-10-21 ENCOUNTER — Telehealth: Payer: Self-pay | Admitting: Orthopedic Surgery

## 2021-10-21 NOTE — Telephone Encounter (Signed)
Pt called and states she has surgery on 04/18/20. She is stating to experiencing a tingling down her leg. She is very worried and wondering if she could get in before 11/01/21?  CB [phone]

## 2021-10-24 ENCOUNTER — Encounter: Payer: Self-pay | Admitting: Orthopedic Surgery

## 2021-10-24 ENCOUNTER — Ambulatory Visit: Payer: Self-pay

## 2021-10-24 ENCOUNTER — Ambulatory Visit (INDEPENDENT_AMBULATORY_CARE_PROVIDER_SITE_OTHER): Payer: Medicare Other | Admitting: Orthopedic Surgery

## 2021-10-24 ENCOUNTER — Other Ambulatory Visit: Payer: Self-pay

## 2021-10-24 DIAGNOSIS — M79605 Pain in left leg: Secondary | ICD-10-CM

## 2021-10-24 NOTE — Progress Notes (Signed)
Office Visit Note   Patient: Nancy Vasquez           Date of Birth: 1941-06-04           MRN: [id number] Visit Date: 10/24/2021 Requested by: Macy Mis, MD [address] [place],  [place] [address] PCP: Macy Mis, MD  Subjective: Chief Complaint  Patient presents with   Left Leg - Follow-up    HPI: Nancy Vasquez is a 80 year old patient who is having some left lateral knee pain.  She underwent left total knee replacement 04/19/2020.  She states today the pain is actually little better.  Has some occasional radiation discretely up to the trochanteric region but no real back pain.  Takes Tylenol very infrequently which helps.  Also uses Vicks vapor rub on that lateral side of the left knee which helps.              ROS: All systems reviewed are negative as they relate to the chief complaint within the history of present illness.  Patient denies  fevers or chills.   Assessment & Plan: Visit Diagnoses:  1. Pain in left leg     Plan: Impression is iliotibial band bursitis left knee.  Structurally the knee looks good with full extension and flexion to about 105 degrees.  No effusion.  No nerve root tension signs.  Plan iliotibial band stretching exercises.  We discussed physical therapy but she wants to hold off on that for now.  I did have her [facility]  Follow-up with [place] as needed.  Follow-Up Instructions: No follow-ups on file.   Orders:  Orders Placed This Encounter  Procedures   XR Lumbar Spine 2-3 Views   No orders of the defined types were placed in this encounter.     Procedures: No procedures performed   Clinical Data: No additional findings.  Objective: Vital Signs: There were no vitals taken for this visit.  Physical Exam:   Constitutional: Patient appears well-developed HEENT:  Head: Normocephalic Eyes:EOM are normal Neck: Normal range of motion Cardiovascular:  Normal rate Pulmonary/chest: Effort normal Neurologic: Patient is alert Skin: Skin is warm Psychiatric: Patient has normal mood and affect   Ortho Exam: Ortho exam demonstrates full active and passive range of motion of the ankle.  Left hip has no pain with internal or external rotation of the hip but slightly less internal rotation to about 25 degrees.  Hip flexion strength intact.  Knee has excellent range of motion on the left from 0-1 05 with no effusion no warmth and no discrete joint line tenderness medially or laterally.  She does have mild left trochanteric tenderness not present on the right.  Specialty Comments:  No specialty comments available.  Imaging: XR Lumbar Spine 2-3 Views  Result Date: 10/24/2021 AP lateral radiographs lumbar spine reviewed.  Mild to moderate changes noted within the intervertebral disc spaces.  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  Right total hip replacement noted.    PMFS History: Patient Active Problem List   Diagnosis Date Noted   Arthritis of left knee 04/19/2020   Past Medical History:  Diagnosis Date   Arthritis    has had right hip replacement    Family History  Problem Relation Age of Onset   Breast cancer Sister 67       metastatic breast ca  Past Surgical History:  Procedure Laterality Date   ABDOMINAL HYSTERECTOMY     BREAST EXCISIONAL BIOPSY Left    INTRAVASCULAR PRESSURE WIRE/FFR STUDY N/A 03/17/2017   Procedure: Intravascular Pressure Wire/FFR Study;  Surgeon: Rinaldo Cloud, MD;  Location: [facility] INVASIVE CV LAB;  Service: Cardiovascular;  Laterality: N/A;  mid LAD   LEFT HEART CATH AND CORONARY ANGIOGRAPHY N/A 03/17/2017   Procedure: Left Heart Cath and Coronary Angiography;  Surgeon: Rinaldo Cloud, MD;  Location: [facility] INVASIVE CV LAB;  Service: Cardiovascular;  Laterality: N/A;   TONSILLECTOMY     Removed as a child   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    Procedure: LEFT TOTAL KNEE ARTHROPLASTY;  Surgeon: Cammy Copa, MD;  Location: [facility] OR;  Service: Orthopedics;  Laterality: Left;   Social History   Occupational History   Not on file  Tobacco Use   Smoking status: Never   Smokeless tobacco: Never  Substance and Sexual Activity   Alcohol use: Never   Drug use: Never   Sexual activity: Not on file

## 2021-11-01 ENCOUNTER — Ambulatory Visit: Payer: Medicare Other | Admitting: Surgical

## 2021-11-29 DIAGNOSIS — E559 Vitamin D deficiency, unspecified: Secondary | ICD-10-CM | POA: Diagnosis not present

## 2021-11-29 DIAGNOSIS — R42 Dizziness and giddiness: Secondary | ICD-10-CM | POA: Diagnosis not present

## 2021-11-29 DIAGNOSIS — M1990 Unspecified osteoarthritis, unspecified site: Secondary | ICD-10-CM | POA: Diagnosis not present

## 2021-11-29 DIAGNOSIS — I2510 Atherosclerotic heart disease of native coronary artery without angina pectoris: Secondary | ICD-10-CM | POA: Diagnosis not present

## 2021-12-31 ENCOUNTER — Ambulatory Visit: Payer: Medicare Other | Admitting: Orthopedic Surgery

## 2021-12-31 DIAGNOSIS — I2510 Atherosclerotic heart disease of native coronary artery without angina pectoris: Secondary | ICD-10-CM | POA: Diagnosis not present

## 2021-12-31 DIAGNOSIS — E785 Hyperlipidemia, unspecified: Secondary | ICD-10-CM | POA: Diagnosis not present

## 2021-12-31 DIAGNOSIS — I10 Essential (primary) hypertension: Secondary | ICD-10-CM | POA: Diagnosis not present

## 2022-01-04 DIAGNOSIS — H524 Presbyopia: Secondary | ICD-10-CM | POA: Diagnosis not present

## 2022-01-20 ENCOUNTER — Ambulatory Visit (INDEPENDENT_AMBULATORY_CARE_PROVIDER_SITE_OTHER): Payer: Medicare Other | Admitting: Orthopedic Surgery

## 2022-01-20 ENCOUNTER — Ambulatory Visit: Payer: Self-pay

## 2022-01-20 ENCOUNTER — Other Ambulatory Visit: Payer: Self-pay

## 2022-01-20 ENCOUNTER — Encounter: Payer: Self-pay | Admitting: Orthopedic Surgery

## 2022-01-20 DIAGNOSIS — Z96652 Presence of left artificial knee joint: Secondary | ICD-10-CM | POA: Diagnosis not present

## 2022-01-20 NOTE — Progress Notes (Signed)
Office Visit Note   Patient: Nancy Vasquez           Date of Birth: 1941/04/14           MRN: [id number] Visit Date: 01/20/2022 Requested by: Macy Mis, MD [address] [place],  [place] [address] PCP: Macy Mis, MD  Subjective: Chief Complaint  Patient presents with   Left Knee - Pain    HPI: Nancy Vasquez is a patient underwent left total knee replacement in April of last year.  Reporting some anterior knee pain.  She did have a fall a month ago mostly on the right knee.  Uses ice pack.  She has cataract surgery Monday.  Stairs are okay for her.  Not taking really any medicine for this problem.              ROS: All systems reviewed are negative as they relate to the chief complaint within the history of present illness.  Patient denies  fevers or chills.   Assessment & Plan: Visit Diagnoses:  1. Status post total left knee replacement     Plan: Impression is well-functioning left total knee replacement with no effusion and normal radiographs today.  I think this is likely a soft tissue injury or bone bruise which should resolve.  No indication for further intervention at this time.  Follow-Up Instructions: Return if symptoms worsen or fail to improve.   Orders:  Orders Placed This Encounter  Procedures   XR Knee 1-2 Views Left   No orders of the defined types were placed in this encounter.     Procedures: No procedures performed   Clinical Data: No additional findings.  Objective: Vital Signs: There were no vitals taken for this visit.  Physical Exam:   Constitutional: Patient appears well-developed HEENT:  Head: Normocephalic Eyes:EOM are normal Neck: Normal range of motion Cardiovascular: Normal rate Pulmonary/chest: Effort normal Neurologic: Patient is alert Skin: Skin is warm Psychiatric: Patient has normal mood and affect   Ortho Exam: Ortho exam demonstrates excellent range of motion of the left knee from full  extension to about 110 of flexion.  Collaterals are stable.  Extensor mechanism intact.  No effusion in the knee.  Not too much asymmetric warmth left knee versus right knee today.  She has reported that the left knee has felt warm at times which is not unexpected up to a year out from surgery.  Specialty Comments:  No specialty comments available.  Imaging: XR Knee 1-2 Views Left  Result Date: 01/20/2022 AP lateral radiographs left knee reviewed.  Press-fit total knee prosthesis posterior cruciate retaining in good position alignment with no lucencies around the bone prosthetic interface.  Alignment intact.  Patella height normal relative to distal femur.  No acute fracture.    PMFS History: Patient Active Problem List   Diagnosis Date Noted   Arthritis of left knee 04/19/2020   Past Medical History:  Diagnosis Date   Arthritis    has had right hip replacement    Family History  Problem Relation Age of Onset   Breast cancer Sister 6       metastatic breast ca    Past Surgical History:  Procedure Laterality Date   ABDOMINAL HYSTERECTOMY     BREAST EXCISIONAL BIOPSY Left    INTRAVASCULAR PRESSURE WIRE/FFR STUDY N/A 03/17/2017   Procedure: Intravascular Pressure Wire/FFR Study;  Surgeon: Rinaldo Cloud, MD;  Location: [facility] INVASIVE CV LAB;  Service: Cardiovascular;  Laterality: N/A;  mid LAD   LEFT HEART CATH AND CORONARY ANGIOGRAPHY N/A 03/17/2017   Procedure: Left Heart Cath and Coronary Angiography;  Surgeon: Rinaldo Cloud, MD;  Location: [facility] INVASIVE CV LAB;  Service: Cardiovascular;  Laterality: N/A;   TONSILLECTOMY     Removed as a child   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   Procedure: LEFT TOTAL KNEE ARTHROPLASTY;  Surgeon: Cammy Copa, MD;  Location: [facility] OR;  Service: Orthopedics;  Laterality: Left;   Social History   Occupational History   Not on file  Tobacco Use   Smoking status: Never   Smokeless tobacco: Never  Substance and Sexual Activity   Alcohol  use: Never   Drug use: Never   Sexual activity: Not on file

## 2022-01-22 ENCOUNTER — Ambulatory Visit: Payer: Medicare Other | Admitting: Orthopedic Surgery

## 2022-01-27 DIAGNOSIS — H25811 Combined forms of age-related cataract, right eye: Secondary | ICD-10-CM | POA: Diagnosis not present

## 2022-01-31 DIAGNOSIS — T148XXA Other injury of unspecified body region, initial encounter: Secondary | ICD-10-CM | POA: Diagnosis not present

## 2022-01-31 DIAGNOSIS — Z23 Encounter for immunization: Secondary | ICD-10-CM | POA: Diagnosis not present

## 2022-03-03 DIAGNOSIS — H25811 Combined forms of age-related cataract, right eye: Secondary | ICD-10-CM | POA: Diagnosis not present

## 2022-03-06 DIAGNOSIS — K5900 Constipation, unspecified: Secondary | ICD-10-CM | POA: Diagnosis not present

## 2022-03-06 DIAGNOSIS — K602 Anal fissure, unspecified: Secondary | ICD-10-CM | POA: Diagnosis not present

## 2022-03-06 DIAGNOSIS — K5730 Diverticulosis of large intestine without perforation or abscess without bleeding: Secondary | ICD-10-CM | POA: Diagnosis not present

## 2022-03-06 DIAGNOSIS — K625 Hemorrhage of anus and rectum: Secondary | ICD-10-CM | POA: Diagnosis not present

## 2022-03-17 DIAGNOSIS — H25812 Combined forms of age-related cataract, left eye: Secondary | ICD-10-CM | POA: Diagnosis not present

## 2022-04-02 DIAGNOSIS — E785 Hyperlipidemia, unspecified: Secondary | ICD-10-CM | POA: Diagnosis not present

## 2022-04-02 DIAGNOSIS — I25118 Atherosclerotic heart disease of native coronary artery with other forms of angina pectoris: Secondary | ICD-10-CM | POA: Diagnosis not present

## 2022-04-02 DIAGNOSIS — I10 Essential (primary) hypertension: Secondary | ICD-10-CM | POA: Diagnosis not present

## 2022-04-02 DIAGNOSIS — R0789 Other chest pain: Secondary | ICD-10-CM | POA: Diagnosis not present

## 2022-06-06 ENCOUNTER — Telehealth: Payer: Self-pay

## 2022-06-06 DIAGNOSIS — H35351 Cystoid macular degeneration, right eye: Secondary | ICD-10-CM | POA: Diagnosis not present

## 2022-06-06 DIAGNOSIS — H209 Unspecified iridocyclitis: Secondary | ICD-10-CM | POA: Diagnosis not present

## 2022-06-06 NOTE — Telephone Encounter (Signed)
Patient called concerning left LE pain that is radiating up to her left thigh and hip.  Advised patient per Harriette Bouillon if hurting that bad to go to the ED and that she could be worked into the schedule on Monday, 06/09/2022.  Advised patient of message above, but patient stated that she would call back.

## 2022-06-13 ENCOUNTER — Ambulatory Visit: Payer: Medicare Other | Admitting: Surgical

## 2022-06-13 DIAGNOSIS — Z96652 Presence of left artificial knee joint: Secondary | ICD-10-CM | POA: Diagnosis not present

## 2022-06-13 DIAGNOSIS — M1612 Unilateral primary osteoarthritis, left hip: Secondary | ICD-10-CM

## 2022-06-13 DIAGNOSIS — M5136 Other intervertebral disc degeneration, lumbar region: Secondary | ICD-10-CM | POA: Diagnosis not present

## 2022-06-15 ENCOUNTER — Encounter: Payer: Self-pay | Admitting: Surgical

## 2022-06-15 NOTE — Progress Notes (Signed)
Office Visit Note   Patient: Nancy Vasquez           Date of Birth: Jul 05, 1941           MRN: [id number] Visit Date: 06/13/2022 Requested by: Macy Mis, MD [address] [place],  [place] [address] PCP: Macy Mis, MD  Subjective: Chief Complaint  Patient presents with   Left Knee - Pain    HPI: Nancy Vasquez is a 81 y.o. female who presents to the office complaining of left leg pain.  Patient has history of left total knee arthroplasty.  She states that she was having severe pain last Friday that was traveling from her thigh down to her ankle.  She describes severe burning sensation without any injury.  No fevers or chills.  No chest pain or shortness of breath.  She states that the pain has significantly improved since that episode and she now only takes Tylenol as needed.  Using a cane again to help protect the knee.  She just wants to "make sure everything is okay"..                ROS: All systems reviewed are negative as they relate to the chief complaint within the history of present illness.  Patient denies fevers or chills.  Assessment & Plan: Visit Diagnoses:  1. Status post total left knee replacement   2. Degenerative disc disease, lumbar   3. Unilateral primary osteoarthritis, left hip     Plan: Patient is a 81 year old female who presents for evaluation of left leg pain.  She had onset of severe burning pain in the left leg that extended from her thigh down to her ankle 1 to 2 weeks ago.  This pain has significantly improved without intervention but she is concerned and wants to make sure that she has not developed a blood clot in her leg or that there is anything wrong with her knee.  She has excellent range of motion on exam without any sign of joint infection.  There is no sign of blood clot on exam.  Most of her history seems concerning for radicular pain from the lumbar spine and she does have previous radiographs in late 2022 that  demonstrated moderate degenerative changes at multiple levels in the lumbar spine with grade 1 spondylolisthesis at L4-L5.  She also has history of right hip replacement that is doing well for her currently but noted on those lumbar spine radiographs is severe end-stage hip arthritis of the left hip.  This may also be a cause of her left knee pain at times.  After discussion of options, patient wants to hold off on any further intervention and she will return if she has increased pain.  Would probably look at either diagnostic intra-articular injection in the left hip versus MRI of the lumbar spine as a neck step in the work-up of why she is having left leg pain.  Follow-Up Instructions: No follow-ups on file.   Orders:  No orders of the defined types were placed in this encounter.  No orders of the defined types were placed in this encounter.     Procedures: No procedures performed   Clinical Data: No additional findings.  Objective: Vital Signs: There were no vitals taken for this visit.  Physical Exam:  Constitutional: Patient appears well-developed HEENT:  Head: Normocephalic Eyes:EOM are normal Neck: Normal range of motion Cardiovascular: Normal rate Pulmonary/chest: Effort normal Neurologic: Patient is alert Skin:  Skin is warm Psychiatric: Patient has normal mood and affect  Ortho Exam: Ortho exam demonstrates left knee with well-healed incision from prior total knee arthroplasty.  0 degrees extension and 115 degrees of knee flexion.  No significant pain with passive motion of the knee.  No effusion noted.  No calf tenderness.  Negative Homans' sign.  Patient is able to perform straight leg raise without extensor lag.  Stable to stressing of the MCL and LCL.  She has reproduction of knee and thigh pain with FADIR test and with Stinchfield sign.  She has significantly decreased internal rotation of the left hip.  Negative straight leg raise.  5/5 motor strength of bilateral hip  flexion, quadricep, hamstring, dorsiflexion, plantarflexion.  Specialty Comments:  No specialty comments available.  Imaging: No results found.   PMFS History: Patient Active Problem List   Diagnosis Date Noted   Arthritis of left knee 04/19/2020   Past Medical History:  Diagnosis Date   Arthritis    has had right hip replacement    Family History  Problem Relation Age of Onset   Breast cancer Sister 60       metastatic breast ca    Past Surgical History:  Procedure Laterality Date   ABDOMINAL HYSTERECTOMY     BREAST EXCISIONAL BIOPSY Left    INTRAVASCULAR PRESSURE WIRE/FFR STUDY N/A 03/17/2017   Procedure: Intravascular Pressure Wire/FFR Study;  Surgeon: Rinaldo Cloud, MD;  Location: [facility] INVASIVE CV LAB;  Service: Cardiovascular;  Laterality: N/A;  mid LAD   LEFT HEART CATH AND CORONARY ANGIOGRAPHY N/A 03/17/2017   Procedure: Left Heart Cath and Coronary Angiography;  Surgeon: Rinaldo Cloud, MD;  Location: [facility] INVASIVE CV LAB;  Service: Cardiovascular;  Laterality: N/A;   TONSILLECTOMY     Removed as a child   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   Procedure: LEFT TOTAL KNEE ARTHROPLASTY;  Surgeon: Cammy Copa, MD;  Location: [facility] OR;  Service: Orthopedics;  Laterality: Left;   Social History   Occupational History   Not on file  Tobacco Use   Smoking status: Never   Smokeless tobacco: Never  Substance and Sexual Activity   Alcohol use: Never   Drug use: Never   Sexual activity: Not on file

## 2022-06-30 DIAGNOSIS — H5203 Hypermetropia, bilateral: Secondary | ICD-10-CM | POA: Diagnosis not present

## 2022-07-02 DIAGNOSIS — E559 Vitamin D deficiency, unspecified: Secondary | ICD-10-CM | POA: Diagnosis not present

## 2022-07-02 DIAGNOSIS — E785 Hyperlipidemia, unspecified: Secondary | ICD-10-CM | POA: Diagnosis not present

## 2022-07-02 DIAGNOSIS — K219 Gastro-esophageal reflux disease without esophagitis: Secondary | ICD-10-CM | POA: Diagnosis not present

## 2022-07-02 DIAGNOSIS — I25118 Atherosclerotic heart disease of native coronary artery with other forms of angina pectoris: Secondary | ICD-10-CM | POA: Diagnosis not present

## 2022-07-02 DIAGNOSIS — I10 Essential (primary) hypertension: Secondary | ICD-10-CM | POA: Diagnosis not present

## 2022-07-02 DIAGNOSIS — I2510 Atherosclerotic heart disease of native coronary artery without angina pectoris: Secondary | ICD-10-CM | POA: Diagnosis not present

## 2022-07-12 ENCOUNTER — Encounter (HOSPITAL_COMMUNITY): Payer: Self-pay | Admitting: Emergency Medicine

## 2022-07-12 ENCOUNTER — Ambulatory Visit (HOSPITAL_COMMUNITY)
Admission: EM | Admit: 2022-07-12 | Discharge: 2022-07-12 | Disposition: A | Discharge status: Home / Self Care | Payer: Medicare Other | Attending: Student | Admitting: Student

## 2022-07-12 DIAGNOSIS — Z0189 Encounter for other specified special examinations: Secondary | ICD-10-CM | POA: Diagnosis not present

## 2022-07-12 DIAGNOSIS — Z1152 Encounter for screening for COVID-19: Secondary | ICD-10-CM | POA: Insufficient documentation

## 2022-07-12 DIAGNOSIS — Z20822 Contact with and (suspected) exposure to covid-19: Secondary | ICD-10-CM | POA: Insufficient documentation

## 2022-07-12 DIAGNOSIS — B349 Viral infection, unspecified: Secondary | ICD-10-CM | POA: Insufficient documentation

## 2022-07-12 LAB — BASIC METABOLIC PANEL
Anion gap: 11 (ref 5–15)
BUN: 5 mg/dL — ABNORMAL LOW (ref 8–23)
CO2: 23 mmol/L (ref 22–32)
Calcium: 9.4 mg/dL (ref 8.9–10.3)
Chloride: 106 mmol/L (ref 98–111)
Creatinine, Ser: 0.82 mg/dL (ref 0.44–1.00)
GFR, Estimated: >60 mL/min (ref >60)
Glucose, Bld: 105 mg/dL — ABNORMAL HIGH (ref 70–99)
Potassium: 4.3 mmol/L (ref 3.5–5.1)
Sodium: 140 mmol/L (ref 135–145)

## 2022-07-12 NOTE — ED Provider Notes (Addendum)
[facility]    CSN: [id number] Arrival date & time: 07/12/22  1038      History   Chief Complaint Chief Complaint  Patient presents with   Leg Pain   Generalized Body Aches    HPI Nancy Vasquez is a 81 y.o. female presenting with COVID symptoms following exposure to COVID.  History of left knee arthritis and left knee arthroplasty, tonsillectomy.  She has been caring for her husband who has COVID-19 for the last 3 days.  She states new onset of myalgias, dry cough, malaise, tactile fevers in the last 24 hours.  She had a negative home COVID test 1 day ago.  Denies chest pain, shortness of breath, dizziness, weakness.  HPI  Past Medical History:  Diagnosis Date   Arthritis    has had right hip replacement    Patient Active Problem List   Diagnosis Date Noted   Arthritis of left knee 04/19/2020    Past Surgical History:  Procedure Laterality Date   ABDOMINAL HYSTERECTOMY     BREAST EXCISIONAL BIOPSY Left    INTRAVASCULAR PRESSURE WIRE/FFR STUDY N/A 03/17/2017   Procedure: Intravascular Pressure Wire/FFR Study;  Surgeon: Rinaldo Cloud, MD;  Location: [facility] INVASIVE CV LAB;  Service: Cardiovascular;  Laterality: N/A;  mid LAD   LEFT HEART CATH AND CORONARY ANGIOGRAPHY N/A 03/17/2017   Procedure: Left Heart Cath and Coronary Angiography;  Surgeon: Rinaldo Cloud, MD;  Location: [facility] INVASIVE CV LAB;  Service: Cardiovascular;  Laterality: N/A;   TONSILLECTOMY     Removed as a child   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   Procedure: LEFT TOTAL KNEE ARTHROPLASTY;  Surgeon: Cammy Copa, MD;  Location: [facility] OR;  Service: Orthopedics;  Laterality: Left;    OB History   No obstetric history on file.      Home Medications    Prior to Admission medications   Medication Sig Start Date End Date Taking? Authorizing Provider  acetaminophen (TYLENOL 8 HOUR) 650 MG CR tablet Take 2 tablets (1,300 mg total) by mouth every 8 (eight) hours as needed for  pain. Patient taking differently: Take 650 mg by mouth every 8 (eight) hours as needed for pain.  02/21/18   Ofilia Neas, PA-C  amLODipine (NORVASC) 2.5 MG tablet Take 2.5 mg by mouth daily.    [provider]  aspirin 81 MG tablet Take 81 mg by mouth daily.    [provider]  atorvastatin (LIPITOR) 20 MG tablet Take 20 mg by mouth daily.    [provider]  Camphor-Eucalyptus-Menthol (VICKS VAPORUB EX) Apply 1 application topically daily as needed (congestion).    [provider]  Cholecalciferol (VITAMIN D3) 1.25 MG (50000 UT) CAPS Take 1 capsule by mouth once a week. 08/03/20   [provider]  ergocalciferol (VITAMIN D2) 1.25 MG (50000 UT) capsule Take 50,000 Units by mouth once a week. Takes on Thursday    [provider]  Menthol, Topical Analgesic, (BIOFREEZE EX) Apply 1 application topically daily as needed (pain).    [provider]  Menthol, Topical Analgesic, (STOPAIN ROLL-ON EX) Apply 1 application topically daily as needed (pain).    [provider]  methocarbamol (ROBAXIN) 500 MG tablet Take 1 tablet (500 mg total) by mouth every 8 (eight) hours as needed for muscle spasms. 04/21/20   Cammy Copa, MD  nitroGLYCERIN (NITROSTAT) 0.4 MG SL tablet Place 0.4 mg under the tongue every 5 (five) minutes as needed for chest pain.  [provider]  traMADol (ULTRAM) 50 MG tablet TAKE 1 TABLET(50 MG) BY MOUTH EVERY 8 HOURS AS NEEDED 05/24/20   Magnant, Joycie Peek, PA-C    Family History Family History  Problem Relation Age of Onset   Breast cancer Sister 67       metastatic breast ca    Social History Social History   Tobacco Use   Smoking status: Never   Smokeless tobacco: Never  Substance Use Topics   Alcohol use: Never   Drug use: Never     Allergies   Ibuprofen   Review of Systems Review of Systems  Constitutional:  Negative for appetite change, chills and fever.  HENT:  Negative  for congestion, ear pain, rhinorrhea, sinus pressure, sinus pain and sore throat.   Eyes:  Negative for redness and visual disturbance.  Respiratory:  Positive for cough. Negative for chest tightness, shortness of breath and wheezing.   Cardiovascular:  Negative for chest pain and palpitations.  Gastrointestinal:  Negative for abdominal pain, constipation, diarrhea, nausea and vomiting.  Genitourinary:  Negative for dysuria, frequency and urgency.  Musculoskeletal:  Negative for myalgias.  Neurological:  Negative for dizziness, weakness and headaches.  Psychiatric/Behavioral:  Negative for confusion.   All other systems reviewed and are negative.    Physical Exam Triage Vital Signs ED Triage Vitals  Enc Vitals Group     BP 07/12/22 1136 97/60     Pulse Rate 07/12/22 1136 80     Resp 07/12/22 1136 20     Temp 07/12/22 1136 100.2 F (37.9 C)     Temp Source 07/12/22 1136 Oral     SpO2 07/12/22 1136 95 %     Weight --      Height --      Head Circumference --      Peak Flow --      Pain Score 07/12/22 1135 5     Pain Loc --      Pain Edu? --      Excl. in GC? --    No data found.  Updated Vital Signs BP 97/60 (BP Location: Left Arm)   Pulse 80   Temp 100.2 F (37.9 C) (Oral)   Resp 20   SpO2 95%   Visual Acuity Right Eye Distance:   Left Eye Distance:   Bilateral Distance:    Right Eye Near:   Left Eye Near:    Bilateral Near:     Physical Exam Vitals reviewed.  Constitutional:      Appearance: Normal appearance. She is not ill-appearing or diaphoretic.  HENT:     Head: Normocephalic and atraumatic.     Right Ear: Hearing, tympanic membrane, ear canal and external ear normal. No swelling or tenderness. No middle ear effusion. There is no impacted cerumen. No mastoid tenderness. Tympanic membrane is not injected, scarred, perforated, erythematous, retracted or bulging.     Left Ear: Hearing, tympanic membrane, ear canal and external ear normal. No swelling or  tenderness.  No middle ear effusion. There is no impacted cerumen. No mastoid tenderness. Tympanic membrane is not injected, scarred, perforated, erythematous, retracted or bulging.     Mouth/Throat:     Mouth: Mucous membranes are moist.     Pharynx: Oropharynx is clear. No oropharyngeal exudate or posterior oropharyngeal erythema.     Comments: Tonsils absent  Eyes:     Extraocular Movements: Extraocular movements intact.     Pupils: Pupils are equal, round, and reactive to light.  Cardiovascular:     Rate and Rhythm: Normal rate and regular rhythm.     Pulses:          Radial pulses are 2+ on the right side and 2+ on the left side.     Heart sounds: Normal heart sounds.     Comments: Calves are equal and symmetric. No calf swelling or venous distension. Pulmonary:     Effort: Pulmonary effort is normal.     Breath sounds: Normal breath sounds. No decreased breath sounds, wheezing, rhonchi or rales.     Comments: Freq dry cough  Abdominal:     Palpations: Abdomen is soft.     Tenderness: There is no abdominal tenderness. There is no guarding or rebound.  Musculoskeletal:     Right lower leg: No edema.     Left lower leg: No edema.  Lymphadenopathy:     Cervical: No cervical adenopathy.  Skin:    General: Skin is warm.     Capillary Refill: Capillary refill takes less than 2 seconds.  Neurological:     General: No focal deficit present.     Mental Status: She is alert and oriented to person, place, and time.  Psychiatric:        Mood and Affect: Mood normal.        Behavior: Behavior normal.        Thought Content: Thought content normal.        Judgment: Judgment normal.      UC Treatments / Results  Labs (all labs ordered are listed, but only abnormal results are displayed) Labs Reviewed  SARS CORONAVIRUS 2 (TAT 6-24 HRS)  BASIC METABOLIC PANEL    EKG   Radiology No results found.  Procedures Procedures (including critical care time)  Medications Ordered  in UC Medications - No data to display  Initial Impression / Assessment and Plan / UC Course  I have reviewed the triage vital signs and the nursing notes.  Pertinent labs & imaging results that were available during my care of the patient were reviewed by me and considered in my medical decision making (see chart for details).     This patient is a very pleasant 81 y.o. year old female presenting with viral symptoms x24 hours following exposure to covid. Today this pt is afebrile nontachycardic nontachypneic, oxygenating well on room air, no wheezes rhonchi or rales. No history pulm ds. Negative home covid test, but suspect she has covid given presentation and exposure.  Discussed option of molnupiravir, which she declines in favor of Paxlovid.  I do not have a recent kidney function on her, so we will check a BMP today.  If her COVID is positive and BMP is within normal limits, could send Paxlovid.  She is in agreement.   Final Clinical Impressions(s) / UC Diagnoses   Final diagnoses:  Viral syndrome  Exposure to confirmed case of COVID-19  Encounter for screening for COVID-19  Routine lab draw     Discharge Instructions      -Covid test sent today. Isolation precautions per CDC guidelines until negative result. Symptomatic relief with OTC Mucinex, Nyquil, etc. Return precautions- new/worsening fevers/chills, shortness of breath, chest pain, abd pain, etc.   -We are checking a COVID test today.  We are also checking your kidney function.  Following these results, we will call you to discuss antiviral therapy. -Seek additional medical attention if you develop new symptoms like shortness of breath, weakness, chest pain, dizziness. -With a virus,  you're typically contagious for 5-7 days, or as long as you're having fevers.     ED Prescriptions   None    PDMP not reviewed this encounter.   Rhys Martini, PA-C 07/12/22 1251    Rhys Martini, PA-C 07/12/22 1538

## 2022-07-12 NOTE — Discharge Instructions (Signed)
-  Covid test sent today. Isolation precautions per CDC guidelines until negative result. Symptomatic relief with OTC Mucinex, Nyquil, etc. Return precautions- new/worsening fevers/chills, shortness of breath, chest pain, abd pain, etc.   -We are checking a COVID test today.  We are also checking your kidney function.  Following these results, we will call you to discuss antiviral therapy. -Seek additional medical attention if you develop new symptoms like shortness of breath, weakness, chest pain, dizziness. -With a virus, you're typically contagious for 5-7 days, or as long as you're having fevers.

## 2022-07-12 NOTE — ED Triage Notes (Signed)
Pt c/o body aches that started last night esp in left leg (had knee surgery 2 years ago on that leg). Pt reports been taking care of husband who covid+ on Wed. Pt had a covid negative test at home. Pt also has cough that is dry.

## 2022-07-14 LAB — SARS CORONAVIRUS 2 (TAT 6-24 HRS): SARS Coronavirus 2: POSITIVE — AB

## 2022-07-15 ENCOUNTER — Telehealth (HOSPITAL_COMMUNITY): Payer: Self-pay | Admitting: Emergency Medicine

## 2022-07-15 NOTE — Telephone Encounter (Signed)
Opened in error

## 2022-08-01 ENCOUNTER — Ambulatory Visit: Payer: Medicare Other | Admitting: Surgical

## 2022-08-06 DIAGNOSIS — H35351 Cystoid macular degeneration, right eye: Secondary | ICD-10-CM | POA: Diagnosis not present

## 2022-08-06 DIAGNOSIS — H209 Unspecified iridocyclitis: Secondary | ICD-10-CM | POA: Diagnosis not present

## 2022-08-22 ENCOUNTER — Other Ambulatory Visit: Payer: Self-pay | Admitting: Family Medicine

## 2022-08-22 DIAGNOSIS — Z1231 Encounter for screening mammogram for malignant neoplasm of breast: Secondary | ICD-10-CM

## 2022-09-17 DIAGNOSIS — I25118 Atherosclerotic heart disease of native coronary artery with other forms of angina pectoris: Secondary | ICD-10-CM | POA: Diagnosis not present

## 2022-09-17 DIAGNOSIS — I10 Essential (primary) hypertension: Secondary | ICD-10-CM | POA: Diagnosis not present

## 2022-09-17 DIAGNOSIS — E559 Vitamin D deficiency, unspecified: Secondary | ICD-10-CM | POA: Diagnosis not present

## 2022-09-17 DIAGNOSIS — E785 Hyperlipidemia, unspecified: Secondary | ICD-10-CM | POA: Diagnosis not present

## 2022-09-27 ENCOUNTER — Encounter (HOSPITAL_COMMUNITY): Payer: Self-pay | Admitting: Internal Medicine

## 2022-09-27 ENCOUNTER — Emergency Department (HOSPITAL_COMMUNITY): Payer: Medicare Other

## 2022-09-27 ENCOUNTER — Observation Stay (HOSPITAL_COMMUNITY): Payer: Medicare Other

## 2022-09-27 ENCOUNTER — Observation Stay (HOSPITAL_COMMUNITY)
Admission: EM | Admit: 2022-09-27 | Discharge: 2022-09-28 | Disposition: A | Discharge status: Home / Self Care | Payer: Medicare Other | Attending: Internal Medicine | Admitting: Internal Medicine

## 2022-09-27 ENCOUNTER — Other Ambulatory Visit: Payer: Self-pay

## 2022-09-27 DIAGNOSIS — I2510 Atherosclerotic heart disease of native coronary artery without angina pectoris: Secondary | ICD-10-CM | POA: Diagnosis not present

## 2022-09-27 DIAGNOSIS — Z20822 Contact with and (suspected) exposure to covid-19: Secondary | ICD-10-CM | POA: Insufficient documentation

## 2022-09-27 DIAGNOSIS — R4701 Aphasia: Secondary | ICD-10-CM

## 2022-09-27 DIAGNOSIS — R29818 Other symptoms and signs involving the nervous system: Secondary | ICD-10-CM | POA: Diagnosis not present

## 2022-09-27 DIAGNOSIS — Z7982 Long term (current) use of aspirin: Secondary | ICD-10-CM | POA: Diagnosis not present

## 2022-09-27 DIAGNOSIS — Z96652 Presence of left artificial knee joint: Secondary | ICD-10-CM | POA: Insufficient documentation

## 2022-09-27 DIAGNOSIS — G459 Transient cerebral ischemic attack, unspecified: Principal | ICD-10-CM

## 2022-09-27 DIAGNOSIS — Z79899 Other long term (current) drug therapy: Secondary | ICD-10-CM | POA: Insufficient documentation

## 2022-09-27 DIAGNOSIS — I639 Cerebral infarction, unspecified: Secondary | ICD-10-CM | POA: Diagnosis not present

## 2022-09-27 DIAGNOSIS — G9389 Other specified disorders of brain: Secondary | ICD-10-CM | POA: Diagnosis not present

## 2022-09-27 DIAGNOSIS — I6521 Occlusion and stenosis of right carotid artery: Secondary | ICD-10-CM | POA: Diagnosis not present

## 2022-09-27 HISTORY — DX: Atherosclerotic heart disease of native coronary artery without angina pectoris: I25.10

## 2022-09-27 HISTORY — DX: Aphasia: R47.01

## 2022-09-27 LAB — CBC
HCT: 39.7 % (ref 36.0–46.0)
Hemoglobin: 12.6 g/dL (ref 12.0–15.0)
MCH: 31 pg (ref 26.0–34.0)
MCHC: 31.7 g/dL (ref 30.0–36.0)
MCV: 97.5 fL (ref 80.0–100.0)
Platelets: 275 10*3/uL (ref 150–400)
RBC: 4.07 MIL/uL (ref 3.87–5.11)
RDW: 11.9 % (ref 11.5–15.5)
WBC: 6.2 10*3/uL (ref 4.0–10.5)
nRBC: 0 % (ref 0.0–0.2)

## 2022-09-27 LAB — RESP PANEL BY RT-PCR (FLU A&B, COVID) ARPGX2
Influenza A by PCR: NEGATIVE
Influenza B by PCR: NEGATIVE
SARS Coronavirus 2 by RT PCR: NEGATIVE

## 2022-09-27 LAB — DIFFERENTIAL
Abs Immature Granulocytes: 0.03 10*3/uL (ref 0.00–0.07)
Basophils Absolute: 0 10*3/uL (ref 0.0–0.1)
Basophils Relative: 1 %
Eosinophils Absolute: 0.2 10*3/uL (ref 0.0–0.5)
Eosinophils Relative: 3 %
Immature Granulocytes: 1 %
Lymphocytes Relative: 35 %
Lymphs Abs: 2.1 10*3/uL (ref 0.7–4.0)
Monocytes Absolute: 0.7 10*3/uL (ref 0.1–1.0)
Monocytes Relative: 11 %
Neutro Abs: 3.1 10*3/uL (ref 1.7–7.7)
Neutrophils Relative %: 49 %

## 2022-09-27 LAB — COMPREHENSIVE METABOLIC PANEL
ALT: 14 U/L (ref 0–44)
AST: 15 U/L (ref 15–41)
Albumin: 3.7 g/dL (ref 3.5–5.0)
Alkaline Phosphatase: 50 U/L (ref 38–126)
Anion gap: 6 (ref 5–15)
BUN: 11 mg/dL (ref 8–23)
CO2: 26 mmol/L (ref 22–32)
Calcium: 9.4 mg/dL (ref 8.9–10.3)
Chloride: 109 mmol/L (ref 98–111)
Creatinine, Ser: 0.87 mg/dL (ref 0.44–1.00)
GFR, Estimated: >60 mL/min (ref >60)
Glucose, Bld: 93 mg/dL (ref 70–99)
Potassium: 3.9 mmol/L (ref 3.5–5.1)
Sodium: 141 mmol/L (ref 135–145)
Total Bilirubin: 0.5 mg/dL (ref 0.3–1.2)
Total Protein: 7 g/dL (ref 6.5–8.1)

## 2022-09-27 LAB — APTT: aPTT: 26 seconds (ref 24–36)

## 2022-09-27 LAB — PROTIME-INR
INR: 1 (ref 0.8–1.2)
Prothrombin Time: 12.8 seconds (ref 11.4–15.2)

## 2022-09-27 LAB — ETHANOL: Alcohol, Ethyl (B): <10 mg/dL (ref <10)

## 2022-09-27 MED ORDER — HYDRALAZINE HCL 20 MG/ML IJ SOLN: 10.0000 mg | INTRAMUSCULAR | Status: DC | PRN

## 2022-09-27 MED ORDER — STROKE: EARLY STAGES OF RECOVERY BOOK
Freq: Once | Status: DC
  Filled 2022-09-27: qty 1

## 2022-09-27 MED ORDER — ENOXAPARIN SODIUM 40 MG/0.4ML IJ SOSY
40.0000 mg | PREFILLED_SYRINGE | INTRAMUSCULAR | Status: DC
  Administered 2022-09-27: 40 mg via SUBCUTANEOUS
  Filled 2022-09-27: qty 0.4

## 2022-09-27 MED ORDER — ACETAMINOPHEN 325 MG PO TABS: 650.0000 mg | ORAL_TABLET | ORAL | Status: DC | PRN

## 2022-09-27 MED ORDER — ATORVASTATIN CALCIUM 40 MG PO TABS
40.0000 mg | ORAL_TABLET | Freq: Every day | ORAL | Status: DC
  Administered 2022-09-28: 40 mg via ORAL
  Filled 2022-09-27: qty 1

## 2022-09-27 MED ORDER — CLOPIDOGREL BISULFATE 300 MG PO TABS
300.0000 mg | ORAL_TABLET | Freq: Once | ORAL | Status: AC
  Administered 2022-09-27: 300 mg via ORAL
  Filled 2022-09-27: qty 1

## 2022-09-27 MED ORDER — ACETAMINOPHEN 160 MG/5ML PO SOLN: 650.0000 mg | ORAL | Status: DC | PRN

## 2022-09-27 MED ORDER — SENNOSIDES-DOCUSATE SODIUM 8.6-50 MG PO TABS: 1.0000 | ORAL_TABLET | Freq: Every evening | ORAL | Status: DC | PRN

## 2022-09-27 MED ORDER — IOHEXOL 350 MG/ML SOLN
75.0000 mL | Freq: Once | INTRAVENOUS | Status: AC | PRN
  Administered 2022-09-27: 75 mL via INTRAVENOUS

## 2022-09-27 MED ORDER — ASPIRIN 81 MG PO TBEC
81.0000 mg | DELAYED_RELEASE_TABLET | Freq: Every day | ORAL | Status: DC
  Administered 2022-09-28: 81 mg via ORAL
  Filled 2022-09-27: qty 1

## 2022-09-27 MED ORDER — CLOPIDOGREL BISULFATE 75 MG PO TABS
75.0000 mg | ORAL_TABLET | Freq: Every day | ORAL | Status: DC
  Administered 2022-09-28: 75 mg via ORAL
  Filled 2022-09-27: qty 1

## 2022-09-27 MED ORDER — ONDANSETRON HCL 4 MG/2ML IJ SOLN: 4.0000 mg | Freq: Four times a day (QID) | INTRAMUSCULAR | Status: DC | PRN

## 2022-09-27 MED ORDER — ACETAMINOPHEN 650 MG RE SUPP: 650.0000 mg | RECTAL | Status: DC | PRN

## 2022-09-27 MED ORDER — ACETAMINOPHEN 325 MG PO TABS: 650.0000 mg | ORAL_TABLET | Freq: Four times a day (QID) | ORAL | Status: DC | PRN

## 2022-09-27 NOTE — Consult Note (Signed)
Neurology Consultation Reason for Consult: TIA Referring Physician: Gloris Manchester  CC: Difficulty speaking  History is obtained from: Patient  HPI: Nancy Vasquez is a 81 y.o. female with a history of hypercholesterolemia who presents with an episode of transient visual change followed by aphasia.  She states that the first thing that she noticed was that she was having trouble seeing out of the left side of her left eye.  She does report that she tried closing each eye sequentially and indeed it seems to be only affecting the left eye.  This improved over a 1/2-hour but then subsequently little while after it improved, she began having difficulty speaking.  Husband reports that it was formed words, but the words did not make sense in the context.  She states that she knew what she wanted to say but wrong words kept coming out.  This lasted for about 15 minutes.  She has never had anything like this before.  And due to the symptoms she came in for further evaluation.  LKW: 0998 tpa given?: no, resolution of symptoms    Past Medical History:  Diagnosis Date   Arthritis    has had right hip replacement     Family History  Problem Relation Age of Onset   Breast cancer Sister 54       metastatic breast ca     Social History:  reports that she has never smoked. She has never used smokeless tobacco. She reports that she does not drink alcohol and does not use drugs.   Exam: Current vital signs: BP (!) 146/75   Pulse 60   Temp 98.4 F (36.9 C) (Oral)   Resp 13   SpO2 99%  Vital signs in last 24 hours: Temp:  [98.4 F (36.9 C)] 98.4 F (36.9 C) (09/30 1655) Pulse Rate:  [58-69] 60 (09/30 2015) Resp:  [13-18] 13 (09/30 2015) BP: (124-148)/(69-75) 146/75 (09/30 2015) SpO2:  [99 %-100 %] 99 % (09/30 2015)   Physical Exam  Constitutional: Appears well-developed and well-nourished.  Psych: Affect appropriate to situation Eyes: No scleral injection HENT: No OP  obstruction MSK: no joint deformities.  Cardiovascular: Normal rate and regular rhythm.  Respiratory: Effort normal, non-labored breathing GI: Soft.  No distension. There is no tenderness.  Skin: WDI  Neuro: Mental Status: Patient is awake, alert, oriented to person, place, month, year, and situation. Patient is able to give a clear and coherent history. No signs of aphasia or neglect Cranial Nerves: II: Visual Fields are full. Pupils are equal, round, and reactive to light.   III,IV, VI: EOMI without ptosis or diploplia.  V: Facial sensation is symmetric to temperature VII: Facial movement is symmetric.  VIII: hearing is intact to voice X: Uvula elevates symmetrically XI: Shoulder shrug is symmetric. XII: tongue is midline without atrophy or fasciculations.  Motor: Tone is normal. Bulk is normal. 5/5 strength was present in all four extremities.  Sensory: Sensation is symmetric to light touch and temperature in the arms and legs. Cerebellar: FNF and HKS are intact bilaterally   I have reviewed labs in epic and the results pertinent to this consultation are: CMP, CBC-normal  I have reviewed the images obtained: CT-no acute findings  Impression: 81 year old female with what sounds like a left-sided TIA.  The fact that it was solely her left eye that was involved makes me think that she likely had an embolus to both her brain as well as the back of her left eye.  This would account for both symptoms.  I would favor admission for work-up, especially given two events.  Recommendations: - HgbA1c, fasting lipid panel - MRI of the brain without contrast - Frequent neuro checks - Echocardiogram - CTA head and neck - Prophylactic therapy-Antiplatelet med: Aspirin - dose 81mg  and plavix 75mg  daily  after 300mg  load  - Risk factor modification - Telemetry monitoring - PT consult, OT consult, Speech consult - Stroke team to follow    Roland Rack, MD [facility] [phone]  If 7pm- 7am, please page neurology on call as listed in [place].

## 2022-09-27 NOTE — Hospital Course (Signed)
Nancy Vasquez is a 81 y.o. female with medical history significant for CAD who presented with transient left visual field change and expressive aphasia and admitted for TIA/CVA work-up.

## 2022-09-27 NOTE — ED Provider Triage Note (Signed)
Emergency Medicine Provider Triage Evaluation Note  Nancy Vasquez , a 81 y.o. female  was evaluated in triage.  Pt complains of 2 episodes of aphasia over the past hour.  Patient states she was riding home with her husband in the car from [place] when she noticed she was unable to speak at all.  She states she was alert and knew what was going on but could not form any words, including inappropriate words.  Patient states this episode lasted approximately 20 minutes and then resolved.  She then had a second episode within the same hour which is now completely resolved.  The patient is asymptomatic at the moment.  She has no previous stroke or TIA history.  Patient has no cardiac history.  She does not take blood thinners  Review of Systems  Positive: As above Negative: As above  Physical Exam  BP 124/69 (BP Location: Right Arm)   Pulse 69   Temp 98.4 F (36.9 C) (Oral)   Resp 16   SpO2 100%  Gen:   Awake, no distress   Resp:  Normal effort  MSK:   Moves extremities without difficulty  Other:  No facial droop, clear oriented speech, able to follow commands, strength grossly equal bilaterally.,  No focal deficit noted  Medical Decision Making  Medically screening exam initiated at 5:00 PM.  Appropriate orders placed.  Nancy Vasquez was informed that the remainder of the evaluation will be completed by another provider, this initial triage assessment does not replace that evaluation, and the importance of remaining in the ED until their evaluation is complete.     Nancy Peng, PA-C 09/27/22 1702

## 2022-09-27 NOTE — H&P (Signed)
History and Physical    Nancy Vasquez [id number] DOB: April 02, 1941 DOA: 09/27/2022  PCP: Katherina Mires, MD  Patient coming from: Home  I have personally briefly reviewed patient's old medical records in [place]  Chief Complaint: Expressive aphasia  HPI: Nancy Vasquez is a 81 y.o. female with medical history significant for CAD who presents to the ED for evaluation of transient left visual change followed by expressive aphasia.  Patient states she was riding as a passenger on her way back from [place] to [place].  Around 1245 earlier today (9/30) she noticed difficulty seeing in her left lateral visual field.  Afterwards around 1400 she was trying to speak to her husband but was unable to get her words out.  She was awake, alert, oriented and knew what she wanted to say and could understand when spoken to but could not say what she wanted to say.  This episode lasted about 15-20 minutes.  She had a second episode of expressive aphasia also lasting 15-20 minutes.  She did not have any headache, nausea, vomiting, chest pain, dyspnea, focal weakness, change in sensation, abdominal pain, dysuria.  Patient states the only medications she is taking regularly are aspirin 81 mg daily and atorvastatin 20 mg daily.  She denies any history of tobacco or alcohol use.  She does note that she has been under stress lately.  ED Course  Labs/Imaging on admission: I have personally reviewed following labs and imaging studies.  Initial vitals showed BP 124/69, pulse 69, RR 16, temp 98.4 F, SPO2 100% on room air.  Labs show WBC 6.2, hemoglobin 12.6, platelets 275,000, sodium 141, potassium 3.9, bicarb 26, BUN 11, creatinine 0.7, serum glucose 93, LFTs within normal limits.  CT head without contrast negative for acute intracranial normalities.  CTA head/neck with and without negative for intracranial LVO.  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  No  hemodynamically significant stenosis in the neck.  Neurology were consulted and recommended medical admission for evaluation of suspected left-sided TIA.  Patient was started on Plavix 300 mg load x1 followed by 75 mg daily.  The hospitalist service was consulted to admit for further evaluation and management.  Review of Systems: All systems reviewed and are negative except as documented in history of present illness above.   Past Medical History:  Diagnosis Date   Arthritis    has had right hip replacement   Coronary artery disease 09/27/2022    Past Surgical History:  Procedure Laterality Date   ABDOMINAL HYSTERECTOMY     BREAST EXCISIONAL BIOPSY Left    INTRAVASCULAR PRESSURE WIRE/FFR STUDY N/A 03/17/2017   Procedure: Intravascular Pressure Wire/FFR Study;  Surgeon: Charolette Forward, MD;  Location: [facility];  Service: Cardiovascular;  Laterality: N/A;  mid LAD   LEFT HEART CATH AND CORONARY ANGIOGRAPHY N/A 03/17/2017   Procedure: Left Heart Cath and Coronary Angiography;  Surgeon: Charolette Forward, MD;  Location: [facility];  Service: Cardiovascular;  Laterality: N/A;   TONSILLECTOMY     Removed as a child   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   Procedure: LEFT TOTAL KNEE ARTHROPLASTY;  Surgeon: Meredith Pel, MD;  Location: [facility];  Service: Orthopedics;  Laterality: Left;    Social History:  reports that she has never smoked. She has never used smokeless tobacco. She reports that she does not drink alcohol and does not use drugs.  Allergies  Allergen Reactions   Ibuprofen Nausea  And Vomiting and Other (See Comments)    "burns stomach"    Family History  Problem Relation Age of Onset   Breast cancer Sister 65       metastatic breast ca     Prior to Admission medications   Medication Sig Start Date End Date Taking? Authorizing Provider  acetaminophen (TYLENOL 8 HOUR) 650 MG CR tablet Take 2 tablets (1,300 mg total) by mouth every 8 (eight) hours as  needed for pain. Patient taking differently: Take 650 mg by mouth every 8 (eight) hours as needed for pain.  02/21/18   Tereasa Coop, PA-C  amLODipine (NORVASC) 2.5 MG tablet Take 2.5 mg by mouth daily.    [provider]  aspirin 81 MG tablet Take 81 mg by mouth daily.    [provider]  atorvastatin (LIPITOR) 20 MG tablet Take 20 mg by mouth daily.    [provider]  Camphor-Eucalyptus-Menthol (VICKS VAPORUB EX) Apply 1 application topically daily as needed (congestion).    [provider]  Cholecalciferol (VITAMIN D3) 1.25 MG (50000 UT) CAPS Take 1 capsule by mouth once a week. 08/03/20   [provider]  ergocalciferol (VITAMIN D2) 1.25 MG (50000 UT) capsule Take 50,000 Units by mouth once a week. Takes on Thursday    [provider]  Menthol, Topical Analgesic, (BIOFREEZE EX) Apply 1 application topically daily as needed (pain).    [provider]  Menthol, Topical Analgesic, (STOPAIN ROLL-ON EX) Apply 1 application topically daily as needed (pain).    [provider]  methocarbamol (ROBAXIN) 500 MG tablet Take 1 tablet (500 mg total) by mouth every 8 (eight) hours as needed for muscle spasms. 04/21/20   Meredith Pel, MD  nitroGLYCERIN (NITROSTAT) 0.4 MG SL tablet Place 0.4 mg under the tongue every 5 (five) minutes as needed for chest pain.    [provider]  traMADol (ULTRAM) 50 MG tablet TAKE 1 TABLET(50 MG) BY MOUTH EVERY 8 HOURS AS NEEDED 05/24/20   Donella Stade, PA-C    Physical Exam: Vitals:   09/27/22 1915 09/27/22 2015 09/27/22 2127 09/27/22 2130  BP: 132/70 (!) 146/75  (!) 146/84  Pulse: (!) 58 60  65  Resp: 18 13  16   Temp:      TempSrc:      SpO2: 99% 99%  99%  Weight:   77.1 kg   Height:   5\' 4"  (1.626 m)    Constitutional: Resting in bed, NAD, calm, comfortable Eyes: PERRL, EOMI, lids and conjunctivae normal ENMT: Mucous membranes are moist. Posterior pharynx clear of any  exudate or lesions.Normal dentition.  Neck: normal, supple, no masses. Respiratory: clear to auscultation bilaterally, no wheezing, no crackles. Normal respiratory effort. No accessory muscle use.  Cardiovascular: Regular rate and rhythm, no murmurs / rubs / gallops. No extremity edema. 2+ pedal pulses. Abdomen: no tenderness, no masses palpated. Musculoskeletal: no clubbing / cyanosis. No joint deformity upper and lower extremities. Good ROM, no contractures. Normal muscle tone.  Skin: no rashes, lesions, ulcers. No induration Neurologic: CN 2-12 grossly intact. Sensation intact. Strength 5/5 in all 4.  Psychiatric: Normal judgment and insight. Alert and oriented x 3. Normal mood.   EKG: Personally reviewed. Sinus rhythm without acute ischemic changes.  Similar to prior.  Assessment/Plan Principal Problem:   Expressive aphasia Active Problems:   Coronary artery disease   Nancy Vasquez is a 81 y.o. female with medical history significant for CAD who presented with transient  left visual field change and expressive aphasia and admitted for TIA/CVA work-up.  Assessment and Plan: * Expressive aphasia Presenting with transient left visual field change and 2 episodes of expressive aphasia.  She is now back to her baseline.  Seen by neurology and admitted for further CVA work-up. -Neurology following -CT head negative for acute changes -CTA head/neck negative for significant LVO or stenosis -Obtain MRI brain -Continue aspirin 81 mg daily -Plavix 300 mg load x1 followed by 75 mg daily -Echocardiogram -Keep on telemetry, continue neurochecks -Check A1c and lipid panel -Increase atorvastatin to 40 mg daily -PT/OT/SLP eval -Allowing permissive hypertension for now  Coronary artery disease Follows with cardiology, Dr. Terrence Dupont.  LHC 03/17/2017 showed multivessel CAD.  Patient denies any chest pain.  EKG without acute ischemic changes. -Continue aspirin 81 mg daily -Increased atorvastatin  to 40 mg daily  DVT prophylaxis: enoxaparin (LOVENOX) injection 40 mg Start: 09/27/22 2200 Code Status: Full code, confirmed with patient on admission Family Communication: Husband at bedside Disposition Plan: From home and likely discharge to home pending clinical progress Consults called: Neurology Severity of Illness: The appropriate patient status for this patient is OBSERVATION. Observation status is judged to be reasonable and necessary in order to provide the required intensity of service to ensure the patient's safety. The patient's presenting symptoms, physical exam findings, and initial radiographic and laboratory data in the context of their medical condition is felt to place them at decreased risk for further clinical deterioration. Furthermore, it is anticipated that the patient will be medically stable for discharge from the hospital within 2 midnights of admission.   Zada Finders MD [facility]  If 7PM-7AM, please contact night-coverage [email or web address]  09/27/2022, 10:04 PM

## 2022-09-27 NOTE — ED Triage Notes (Addendum)
Pt here after having an 2 episodes in the last hour of expressive aphasia. Pt states she was driving and was unable to get words out, was unable to talk to her husband but was aware of what was going on. Pt states episodes lasted approx 20 min. Pt denies any weakness, numbness, change in consciousness. No hx of strokes

## 2022-09-27 NOTE — Assessment & Plan Note (Signed)
Presenting with transient left visual field change and 2 episodes of expressive aphasia.  She is now back to her baseline.  Seen by neurology and admitted for further CVA work-up. -Neurology following -CT head negative for acute changes -CTA head/neck negative for significant LVO or stenosis -Obtain MRI brain -Continue aspirin 81 mg daily -Plavix 300 mg load x1 followed by 75 mg daily -Echocardiogram -Keep on telemetry, continue neurochecks -Check A1c and lipid panel -Increase atorvastatin to 40 mg daily -PT/OT/SLP eval -Allowing permissive hypertension for now

## 2022-09-27 NOTE — ED Notes (Signed)
Admitting Provider at bedside. 

## 2022-09-27 NOTE — ED Notes (Addendum)
Pt needed wheelchair to go to bathroom but needed no assistance walking to toilet. Pt missed cup when trying to collect urine. Will try again when possible.

## 2022-09-27 NOTE — Assessment & Plan Note (Signed)
Follows with cardiology, Dr. Terrence Dupont.  LHC 03/17/2017 showed multivessel CAD.  Patient denies any chest pain.  EKG without acute ischemic changes. -Continue aspirin 81 mg daily -Increased atorvastatin to 40 mg daily

## 2022-09-27 NOTE — ED Provider Notes (Addendum)
[facility] EMERGENCY DEPARTMENT Provider Note   CSN: [id number] Arrival date & time: 09/27/22  1617     History  Chief Complaint  Patient presents with   Aphasia    Nancy Vasquez is a 81 y.o. female.  Patient with 2 episodes of aphasia close together.  First 1 lasted for 30 minutes occurred around 1400 today.  The second 1 lasted for about 15 minutes.  Patient also around 1245 noticed visual abnormalities in the left eye.  In addition patient thought maybe there was some decrease sensation on the left side of her face.  All the symptoms have now resolved.  Patient is never had anything like this before.  Past medical history is significant for hypertension hyperlipidemia.  Past surgical history significant for heart cath in 2018 left total knee in 2021.  Patient never used tobacco products.  Patient also with a history of rheumatic heart disease and heart murmur.  Patient is on a statin and patient also takes a baby aspirin a day.       Home Medications Prior to Admission medications   Medication Sig Start Date End Date Taking? Authorizing Provider  acetaminophen (TYLENOL 8 HOUR) 650 MG CR tablet Take 2 tablets (1,300 mg total) by mouth every 8 (eight) hours as needed for pain. Patient taking differently: Take 650 mg by mouth every 8 (eight) hours as needed for pain.  02/21/18   Tereasa Coop, PA-C  amLODipine (NORVASC) 2.5 MG tablet Take 2.5 mg by mouth daily.    [provider]  aspirin 81 MG tablet Take 81 mg by mouth daily.    [provider]  atorvastatin (LIPITOR) 20 MG tablet Take 20 mg by mouth daily.    [provider]  Camphor-Eucalyptus-Menthol (VICKS VAPORUB EX) Apply 1 application topically daily as needed (congestion).    [provider]  Cholecalciferol (VITAMIN D3) 1.25 MG (50000 UT) CAPS Take 1 capsule by mouth once a week. 08/03/20   [provider]  ergocalciferol (VITAMIN D2) 1.25 MG (50000 UT)  capsule Take 50,000 Units by mouth once a week. Takes on Thursday    [provider]  Menthol, Topical Analgesic, (BIOFREEZE EX) Apply 1 application topically daily as needed (pain).    [provider]  Menthol, Topical Analgesic, (STOPAIN ROLL-ON EX) Apply 1 application topically daily as needed (pain).    [provider]  methocarbamol (ROBAXIN) 500 MG tablet Take 1 tablet (500 mg total) by mouth every 8 (eight) hours as needed for muscle spasms. 04/21/20   Meredith Pel, MD  nitroGLYCERIN (NITROSTAT) 0.4 MG SL tablet Place 0.4 mg under the tongue every 5 (five) minutes as needed for chest pain.    [provider]  traMADol (ULTRAM) 50 MG tablet TAKE 1 TABLET(50 MG) BY MOUTH EVERY 8 HOURS AS NEEDED 05/24/20   Magnant, Gerrianne Scale, PA-C      Allergies    Ibuprofen    Review of Systems   Review of Systems  Constitutional:  Negative for chills and fever.  HENT:  Negative for ear pain and sore throat.   Eyes:  Positive for visual disturbance. Negative for pain.  Respiratory:  Negative for cough and shortness of breath.   Cardiovascular:  Negative for chest pain and palpitations.  Gastrointestinal:  Negative for abdominal pain and vomiting.  Genitourinary:  Negative for dysuria and hematuria.  Musculoskeletal:  Negative for arthralgias and back pain.  Skin:  Negative for color change and rash.  Neurological:  Positive for speech difficulty. Negative for seizures and syncope.  All other systems reviewed and are negative.   Physical Exam Updated Vital Signs BP (!) 148/69   Pulse 61   Temp 98.4 F (36.9 C) (Oral)   Resp 14   SpO2 99%  Physical Exam Vitals and nursing note reviewed.  Constitutional:      General: She is not in acute distress.    Appearance: Normal appearance. She is well-developed.  HENT:     Head: Normocephalic and atraumatic.     Nose: Congestion: .Marland Kitchen  Eyes:     Extraocular Movements: Extraocular movements intact.      Conjunctiva/sclera: Conjunctivae normal.     Pupils: Pupils are equal, round, and reactive to light.  Cardiovascular:     Rate and Rhythm: Normal rate and regular rhythm.     Heart sounds: No murmur heard. Pulmonary:     Effort: Pulmonary effort is normal. No respiratory distress.     Breath sounds: Normal breath sounds.  Abdominal:     Palpations: Abdomen is soft.     Tenderness: There is no abdominal tenderness.  Musculoskeletal:        General: No swelling.     Cervical back: Normal range of motion and neck supple.     Right lower leg: No edema.     Left lower leg: No edema.  Skin:    General: Skin is warm and dry.     Capillary Refill: Capillary refill takes less than 2 seconds.  Neurological:     General: No focal deficit present.     Mental Status: She is alert and oriented to person, place, and time.     Cranial Nerves: No cranial nerve deficit.     Sensory: No sensory deficit.     Motor: No weakness.  Psychiatric:        Mood and Affect: Mood normal.     ED Results / Procedures / Treatments   Labs (all labs ordered are listed, but only abnormal results are displayed) Labs Reviewed  RESP PANEL BY RT-PCR (FLU A&B, COVID) ARPGX2  ETHANOL  PROTIME-INR  APTT  CBC  DIFFERENTIAL  COMPREHENSIVE METABOLIC PANEL  RAPID URINE DRUG SCREEN, HOSP PERFORMED  URINALYSIS, ROUTINE W REFLEX MICROSCOPIC  I-STAT CHEM 8, ED    EKG EKG Interpretation  Date/Time:  Saturday September 27 2022 17:02:47 EDT Ventricular Rate:  68 PR Interval:  162 QRS Duration: 84 QT Interval:  402 QTC Calculation: 427 R Axis:   43 Text Interpretation: Normal sinus rhythm Normal ECG When compared with ECG of 12-Apr-2020 14:59, PREVIOUS ECG IS PRESENT Confirmed by Vanetta Mulders [id number] on 09/27/2022 7:08:26 PM  Radiology CT HEAD WO CONTRAST  Result Date: 09/27/2022 CLINICAL DATA:  Neuro deficit, acute, stroke suspected. Aphasia lasting about 20-30 minutes. Resolved at present EXAM: CT HEAD  WITHOUT CONTRAST TECHNIQUE: Contiguous axial images were obtained from the base of the skull through the vertex without intravenous contrast. RADIATION DOSE REDUCTION: This exam was performed according to the departmental dose-optimization program which includes automated exposure control, adjustment of the mA and/or kV according to patient size and/or use of iterative reconstruction technique. COMPARISON:  CT head 02/08/2013 BRAIN: BRAIN Cerebral ventricle sizes are concordant with the degree of cerebral volume loss. Patchy and confluent areas of decreased attenuation are noted throughout the deep and periventricular white matter of the cerebral hemispheres bilaterally, compatible with chronic microvascular ischemic disease. No evidence of large-territorial acute infarction. No parenchymal hemorrhage. No mass  lesion. No extra-axial collection. No mass effect or midline shift. No hydrocephalus. Basilar cisterns are patent. Vascular: No hyperdense vessel. Skull: No acute fracture or focal lesion. Sinuses/Orbits: Paranasal sinuses and mastoid air cells are clear. Bilateral lens replacement. Otherwise the orbits are unremarkable. Other: None. IMPRESSION: No acute intracranial abnormality. Electronically Signed   By: Iven Finn M.D.   On: 09/27/2022 18:27    Procedures Procedures    Medications Ordered in ED Medications - No data to display  ED Course/ Medical Decision Making/ A&P                           Medical Decision Making Amount and/or Complexity of Data Reviewed Radiology: ordered.  Risk Prescription drug management. Decision regarding hospitalization.   With onset of symptoms optically with the visual changes 1245 but also the aphasia first episode was at 1400 patient out of the window for TNK.  But all symptoms have also resolved.  Sounds as if she had TIAs.  Head CT without any acute findings.  Stroke order set activated out in triage.  We will contact Dr. Leonel Ramsay neuro  hospitalist.  The patient will need MRI and admission.  They may want to do CTAs as well.  Lab work-up alcohol less than 10 CBC no leukocytosis hemoglobin 01.0 complete metabolic panel normal including liver function test renal function normal GFR greater than 60  CRITICAL CARE Performed by: Fredia Sorrow Total critical care time: 40 minutes Critical care time was exclusive of separately billable procedures and treating other patients. Critical care was necessary to treat or prevent imminent or life-threatening deterioration. Critical care was time spent personally by me on the following activities: development of treatment plan with patient and/or surrogate as well as nursing, discussions with consultants, evaluation of patient's response to treatment, examination of patient, obtaining history from patient or surrogate, ordering and performing treatments and interventions, ordering and review of laboratory studies, ordering and review of radiographic studies, pulse oximetry and re-evaluation of patient's condition.  Currently without any neurodeficits.  Discussed with Dr. Leonel Ramsay neuro hospitalist 1 CT angio head neck.  Patient will eventually be admitted.  Patient did not take her baby aspirin today.  The a head neck without significant findings.  [facility] followed [facility].  We will contact unassigned medicine for admission.  Final Clinical Impression(s) / ED Diagnoses Final diagnoses:  Cerebrovascular accident (CVA), unspecified mechanism ([place])  TIA (transient ischemic attack)    Rx / DC Orders ED Discharge Orders     None         Fredia Sorrow, MD 09/27/22 Jillyn Ledger, MD 09/27/22 Einar Crow    Fredia Sorrow, MD 09/27/22 2725    Fredia Sorrow, MD 09/27/22 3664    Fredia Sorrow, MD 09/27/22 2104

## 2022-09-28 ENCOUNTER — Observation Stay (HOSPITAL_BASED_OUTPATIENT_CLINIC_OR_DEPARTMENT_OTHER): Payer: Medicare Other

## 2022-09-28 DIAGNOSIS — G459 Transient cerebral ischemic attack, unspecified: Secondary | ICD-10-CM

## 2022-09-28 DIAGNOSIS — E7800 Pure hypercholesterolemia, unspecified: Secondary | ICD-10-CM | POA: Diagnosis not present

## 2022-09-28 DIAGNOSIS — R4701 Aphasia: Secondary | ICD-10-CM | POA: Diagnosis not present

## 2022-09-28 LAB — BASIC METABOLIC PANEL
Anion gap: 7 (ref 5–15)
BUN: 10 mg/dL (ref 8–23)
CO2: 24 mmol/L (ref 22–32)
Calcium: 9 mg/dL (ref 8.9–10.3)
Chloride: 108 mmol/L (ref 98–111)
Creatinine, Ser: 0.81 mg/dL (ref 0.44–1.00)
GFR, Estimated: >60 mL/min (ref >60)
Glucose, Bld: 100 mg/dL — ABNORMAL HIGH (ref 70–99)
Potassium: 3.8 mmol/L (ref 3.5–5.1)
Sodium: 139 mmol/L (ref 135–145)

## 2022-09-28 LAB — RAPID URINE DRUG SCREEN, HOSP PERFORMED
Amphetamines: NOT DETECTED
Barbiturates: NOT DETECTED
Benzodiazepines: NOT DETECTED
Cocaine: NOT DETECTED
Opiates: NOT DETECTED
Tetrahydrocannabinol: NOT DETECTED

## 2022-09-28 LAB — URINALYSIS, ROUTINE W REFLEX MICROSCOPIC
Bilirubin Urine: NEGATIVE
Glucose, UA: NEGATIVE mg/dL
Hgb urine dipstick: NEGATIVE
Ketones, ur: NEGATIVE mg/dL
Leukocytes,Ua: NEGATIVE
Nitrite: NEGATIVE
Protein, ur: NEGATIVE mg/dL
Specific Gravity, Urine: 1.042 — ABNORMAL HIGH (ref 1.005–1.030)
pH: 6 (ref 5.0–8.0)

## 2022-09-28 LAB — LIPID PANEL
Cholesterol: 152 mg/dL (ref 0–200)
HDL: 68 mg/dL (ref >40)
LDL Cholesterol: 76 mg/dL (ref 0–99)
Total CHOL/HDL Ratio: 2.2 RATIO
Triglycerides: 40 mg/dL (ref <150)
VLDL: 8 mg/dL (ref 0–40)

## 2022-09-28 LAB — HEMOGLOBIN A1C
Hgb A1c MFr Bld: 4.7 % — ABNORMAL LOW (ref 4.8–5.6)
Mean Plasma Glucose: 88.19 mg/dL

## 2022-09-28 LAB — ECHOCARDIOGRAM COMPLETE
Area-P 1/2: 3.77 cm2
Height: 64 in
S' Lateral: 2.7 cm
Weight: 2720 oz

## 2022-09-28 LAB — CBC
HCT: 36.6 % (ref 36.0–46.0)
Hemoglobin: 11.6 g/dL — ABNORMAL LOW (ref 12.0–15.0)
MCH: 30.1 pg (ref 26.0–34.0)
MCHC: 31.7 g/dL (ref 30.0–36.0)
MCV: 95.1 fL (ref 80.0–100.0)
Platelets: 250 10*3/uL (ref 150–400)
RBC: 3.85 MIL/uL — ABNORMAL LOW (ref 3.87–5.11)
RDW: 11.9 % (ref 11.5–15.5)
WBC: 6.6 10*3/uL (ref 4.0–10.5)
nRBC: 0 % (ref 0.0–0.2)

## 2022-09-28 MED ORDER — ATORVASTATIN CALCIUM 40 MG PO TABS: 40.0000 mg | ORAL_TABLET | Freq: Every day | ORAL | 0 refills | Status: DC

## 2022-09-28 MED ORDER — CLOPIDOGREL BISULFATE 75 MG PO TABS: 75.0000 mg | ORAL_TABLET | Freq: Every day | ORAL | 1 refills | Status: AC

## 2022-09-28 MED ORDER — ASPIRIN 81 MG PO TABS: 81.0000 mg | ORAL_TABLET | Freq: Every day | ORAL | 0 refills | Status: AC

## 2022-09-28 NOTE — Evaluation (Signed)
Physical Therapy Evaluation Patient Details Name: Nancy Vasquez MRN: [id number] DOB: August 18, 1941 Today's Date: 09/28/2022  History of Present Illness  The pt is a 81 yo female presenting 9/30 after x2 episodes of expressive aphasia. Imaging negative for acute changes, suspect TIA. PMH includes: hypercholesterolemia, and CAD.   Clinical Impression  Pt in bed upon arrival of PT, agreeable to evaluation at this time. Prior to admission the pt was independent with use of SPC, reports she is independent with all IADLs, still driving, and has had no recent falls. The pt was able to complete all bed mobility, sit-stand transfers, and hallway ambulation without need for physical assist. The pt did intermittently use SPC, but had no overt LOB and reports she is at her baseline mobility. The pt and her spouse were educated on BE FAST as well as general mobility recommendations. No further acute PT needed as pt is back to her functional baseline and safe to return home with spouse when medically cleared.     Recommendations for follow up therapy are one component of a multi-disciplinary discharge planning process, led by the attending physician.  Recommendations may be updated based on patient status, additional functional criteria and insurance authorization.  Follow Up Recommendations No PT follow up      Assistance Recommended at Discharge PRN  Patient can return home with the following  Assistance with cooking/housework;Assist for transportation;Help with stairs or ramp for entrance    Equipment Recommendations None recommended by PT  Recommendations for Other Services       Functional Status Assessment Patient has had a recent decline in their functional status and demonstrates the ability to make significant improvements in function in a reasonable and predictable amount of time.     Precautions / Restrictions Precautions Precautions: None Restrictions Weight Bearing Restrictions: No       Mobility  Bed Mobility Overal bed mobility: Independent                  Transfers Overall transfer level: Needs assistance Equipment used: Straight cane, None Transfers: Sit to/from Stand Sit to Stand: Supervision           General transfer comment: supervision for safety    Ambulation/Gait Ambulation/Gait assistance: Min guard, Supervision Gait Distance (Feet): 150 Feet Assistive device: Straight cane, None Gait Pattern/deviations: WFL(Within Functional Limits) Gait velocity: decreased Gait velocity interpretation: 1.31 - 2.62 ft/sec, indicative of limited community ambulator   General Gait Details: pt with mild instability and slowed speed but reports this is her normal gait. able to complete with and without SPC without LOB  Modified Rankin (Stroke Patients Only) Modified Rankin (Stroke Patients Only) Pre-Morbid Rankin Score: No symptoms Modified Rankin: Moderate disability     Balance Overall balance assessment: Mild deficits observed, not formally tested                                           Pertinent Vitals/Pain Pain Assessment Pain Assessment: No/denies pain    Home Living Family/patient expects to be discharged to:: Private residence Living Arrangements: Spouse/significant other Available Help at Discharge: Family;Available 24 hours/day Type of Home: House Home Access: Level entry     Alternate Level Stairs-Number of Steps: 12 (6+6) Home Layout: Multi-level Home Equipment: Cane - single point;Grab bars - tub/shower      Prior Function Prior Level of Function : Driving;Independent/Modified Independent  Mobility Comments: uses SPC, no falls ADLs Comments: independent     Hand Dominance   Dominant Hand: Right    Extremity/Trunk Assessment   Upper Extremity Assessment Upper Extremity Assessment: Overall WFL for tasks assessed    Lower Extremity Assessment Lower Extremity Assessment:  Overall WFL for tasks assessed    Cervical / Trunk Assessment Cervical / Trunk Assessment: Normal  Communication   Communication: No difficulties  Cognition Arousal/Alertness: Awake/alert Behavior During Therapy: WFL for tasks assessed/performed Overall Cognitive Status: Within Functional Limits for tasks assessed                                          General Comments General comments (skin integrity, edema, etc.): VSS on RA        Assessment/Plan    PT Assessment Patient does not need any further PT services  PT Problem List Decreased balance;Decreased mobility       PT Treatment Interventions      PT Goals (Current goals can be found in the Care Plan section)  Acute Rehab PT Goals Patient Stated Goal: return home PT Goal Formulation: All assessment and education complete, DC therapy Time For Goal Achievement: 10/12/22 Potential to Achieve Goals: Good     AM-PAC PT "6 Clicks" Mobility  Outcome Measure Help needed turning from your back to your side while in a flat bed without using bedrails?: None Help needed moving from lying on your back to sitting on the side of a flat bed without using bedrails?: None Help needed moving to and from a bed to a chair (including a wheelchair)?: A Little Help needed standing up from a chair using your arms (e.g., wheelchair or bedside chair)?: A Little Help needed to walk in hospital room?: A Little Help needed climbing 3-5 steps with a railing? : A Little 6 Click Score: 20    End of Session Equipment Utilized During Treatment: Gait belt Activity Tolerance: Patient tolerated treatment well Patient left: in bed;with call bell/phone within reach;with family/visitor present Nurse Communication: Mobility status PT Visit Diagnosis: Other abnormalities of gait and mobility (R26.89);Unsteadiness on feet (R26.81)    Time: 1540-0867 PT Time Calculation (min) (ACUTE ONLY): 27 min   Charges:   PT Evaluation $PT  Eval Low Complexity: 1 Low PT Treatments $Therapeutic Activity: 8-22 mins        West Carbo, PT, DPT   Acute Rehabilitation Department  Nancy Vasquez 09/28/2022, 8:52 AM

## 2022-09-28 NOTE — Discharge Summary (Signed)
Physician Discharge Summary  Patient ID: LOVENE MARET MRN: [id number] DOB/AGE: 07-16-1941 81 y.o.  Admit date: 09/27/2022 Discharge date: 09/28/2022  Admission Diagnoses:  Discharge Diagnoses:  Principal Problem: Transient ischemic attack.  Expressive aphasia Active Problems:   Coronary artery disease   Discharged Condition: stable  Hospital Course: Patient is a 81 year old African-American female with past medical history significant for coronary artery disease.  Patient was admitted with transient left visual changes and expressive aphasia.  Patient was admitted and managed for likely acute CVA.  Imaging studies came back negative.  Patient was managed with aspirin and Plavix.  Neurology team was consulted to assist with patient's management.  Neurology symptoms have resolved.  Patient has been cleared for discharge by the neurology team.  Patient will be discharged on aspirin for 3 weeks.  Patient will continue Plavix 75 Mg p.o. once daily on discharge.  Patient will follow with the primary care provider and the neurology team within 1 week of discharge.  Consults: neurology  Significant Diagnostic Studies: radiology:  CT brain without contrast:  Did not reveal any acute abnormalities.  CTA head and neck revealed: 1. No intracranial large vessel occlusion. 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 2. No hemodynamically significant stenosis in the neck.  MRI brain without contrast revealed: No acute intracranial process. No evidence of acute or subacute infarct.  Treatments: Patient will be discharged back home on aspirin and Plavix for 3 weeks, then continued only on Plavix.  Patient's cardiologist may consider walking patient for possible paroxysmal atrial fibrillation.  Discharge Exam: Blood pressure 104/85, pulse 67, temperature 98.9 F (37.2 C), temperature source Oral, resp. rate 19, height 5\' 4"  (1.626 m), weight 77.1 kg, SpO2 99  %.   Disposition: Discharge disposition: 01-Home or Self Care       Discharge Instructions     Diet - low sodium heart healthy   Complete by: As directed    Increase activity slowly   Complete by: As directed       Allergies as of 09/28/2022       Reactions   Ibuprofen Nausea And Vomiting, Other (See Comments)   "burns stomach"   Oxycodone Nausea Only   "Burns stomach"        Medication List     STOP taking these medications    nitroGLYCERIN 0.4 MG SL tablet Commonly known as: NITROSTAT       TAKE these medications    aspirin 81 MG tablet Take 1 tablet (81 mg total) by mouth daily for 21 days.   atorvastatin 40 MG tablet Commonly known as: LIPITOR Take 1 tablet (40 mg total) by mouth daily. Start taking on: September 29, 2022 What changed:  medication strength how much to take   clopidogrel 75 MG tablet Commonly known as: PLAVIX Take 1 tablet (75 mg total) by mouth daily. Start taking on: September 29, 2022         Signed: Bonnell Public 09/28/2022, 4:44 PM

## 2022-09-28 NOTE — Progress Notes (Signed)
OT Cancellation Note  Patient Details Name: PAILYN BELLEVUE MRN: [id number] DOB: 1941/10/12   Cancelled Treatment:    Reason Eval/Treat Not Completed: OT screened, no needs identified, will sign off Patient working with PT earlier finding no acute OT needs. OT double checking vision and cognition with no issues noted or identified. Patient does not require acute OT evaluation or intervention. OT will sign off at this time. Please re-consult if further needs arise.   Corinne Ports E. Yassir Enis, OTR/L Acute Rehabilitation Services [place] 09/28/2022, 12:00 PM

## 2022-09-28 NOTE — ED Notes (Signed)
Linen changed, patient rinsing off and cleaning up at sink

## 2022-09-28 NOTE — Progress Notes (Addendum)
STROKE TEAM PROGRESS NOTE   INTERVAL HISTORY Patient is seen in her room with her husband at the bedside.  She reports that yesterday, she had an episode of left visual field cut in her left eye only which lasted a few minutes and resolved spontaneously, followed by two episodes of expressive aphasia lasting about 30 minutes and also resolving spontaneously.  Patient reports that she has always been healthy and has never had symptoms like this in the past.  Vitals:   09/28/22 0911 09/28/22 1000 09/28/22 1300 09/28/22 1340  BP:  121/66 131/83   Pulse:  (!) 55 64   Resp:  12 16   Temp: 98.3 F (36.8 C)   98.9 F (37.2 C)  TempSrc: Oral   Oral  SpO2:  96% 98%   Weight:      Height:       CBC:  Recent Labs  Lab 09/27/22 1709 09/28/22 0525  WBC 6.2 6.6  NEUTROABS 3.1  --   HGB 12.6 11.6*  HCT 39.7 36.6  MCV 97.5 95.1  PLT 275 250   Basic Metabolic Panel:  Recent Labs  Lab 09/27/22 1709 09/28/22 0525  NA 141 139  K 3.9 3.8  CL 109 108  CO2 26 24  GLUCOSE 93 100*  BUN 11 10  CREATININE 0.87 0.81  CALCIUM 9.4 9.0   Lipid Panel:  Recent Labs  Lab 09/28/22 0525  CHOL 152  TRIG 40  HDL 68  CHOLHDL 2.2  VLDL 8  LDLCALC 76   HgbA1c:  Recent Labs  Lab 09/28/22 0525  HGBA1C 4.7*   Urine Drug Screen:  Recent Labs  Lab 09/27/22 0023  LABOPIA NONE DETECTED  COCAINSCRNUR NONE DETECTED  LABBENZ NONE DETECTED  AMPHETMU NONE DETECTED  THCU NONE DETECTED  LABBARB NONE DETECTED    Alcohol Level  Recent Labs  Lab 09/27/22 1709  ETH <10    IMAGING past 24 hours ECHOCARDIOGRAM COMPLETE  Result Date: 09/28/2022    ECHOCARDIOGRAM REPORT   Patient Name:   Nancy Vasquez Date of Exam: 09/28/2022 Medical Rec #:  [id number]         Height:       64.0 in Accession #:    [id number]        Weight:       170.0 lb Date of Birth:  [birth year]         BSA:          1.826 m Patient Age:    81 years          BP:           120/64 mmHg Patient Gender: F                 HR:            55 bpm. Exam Location:  Inpatient Procedure: 2D Echo, Cardiac Doppler and Color Doppler Indications:    Stroke I63.9  History:        Patient has no prior history of Echocardiogram examinations.                 CAD.  Sonographer:    Lucendia Herrlich Referring Phys: [id number] VISHAL R PATEL IMPRESSIONS  1. Left ventricular ejection fraction, by estimation, is 55 to 60%. The left ventricle has normal function. The left ventricle has no regional wall motion abnormalities. Left ventricular diastolic parameters are consistent with Grade II diastolic dysfunction (pseudonormalization). False tendon LV apex - normal variant.  2. Right ventricular systolic function is normal. The right ventricular size is normal. There is normal pulmonary artery systolic pressure. The estimated right ventricular systolic pressure is 21.3 mmHg.  3. The mitral valve is grossly normal. Trivial mitral valve regurgitation.  4. The aortic valve is tricuspid. Aortic valve regurgitation is not visualized. Aortic valve sclerosis is present, with no evidence of aortic valve stenosis.  5. The inferior vena cava is normal in size with greater than 50% respiratory variability, suggesting right atrial pressure of 3 mmHg. Comparison(s): No prior Echocardiogram. FINDINGS  Left Ventricle: Left ventricular ejection fraction, by estimation, is 55 to 60%. The left ventricle has normal function. The left ventricle has no regional wall motion abnormalities. The left ventricular internal cavity size was normal in size. There is  no left ventricular hypertrophy. Left ventricular diastolic parameters are consistent with Grade II diastolic dysfunction (pseudonormalization). Right Ventricle: The right ventricular size is normal. No increase in right ventricular wall thickness. Right ventricular systolic function is normal. There is normal pulmonary artery systolic pressure. The tricuspid regurgitant velocity is 2.14 m/s, and  with an assumed right atrial pressure of  3 mmHg, the estimated right ventricular systolic pressure is 21.3 mmHg. Left Atrium: Left atrial size was normal in size. Right Atrium: Right atrial size was normal in size. Pericardium: There is no evidence of pericardial effusion. Mitral Valve: The mitral valve is grossly normal. Trivial mitral valve regurgitation. Tricuspid Valve: The tricuspid valve is grossly normal. Tricuspid valve regurgitation is mild. Aortic Valve: The aortic valve is tricuspid. There is mild aortic valve annular calcification. Aortic valve regurgitation is not visualized. Aortic valve sclerosis is present, with no evidence of aortic valve stenosis. Pulmonic Valve: The pulmonic valve was grossly normal. Pulmonic valve regurgitation is not visualized. Aorta: The aortic root is normal in size and structure. Venous: The inferior vena cava is normal in size with greater than 50% respiratory variability, suggesting right atrial pressure of 3 mmHg. IAS/Shunts: No atrial level shunt detected by color flow Doppler.  LEFT VENTRICLE PLAX 2D LVIDd:         4.10 cm   Diastology LVIDs:         2.70 cm   LV e' medial:    6.64 cm/s LV PW:         1.00 cm   LV E/e' medial:  16.0 LV IVS:        0.90 cm   LV e' lateral:   8.38 cm/s LVOT diam:     2.00 cm   LV E/e' lateral: 12.6 LV SV:         82 LV SV Index:   45 LVOT Area:     3.14 cm  RIGHT VENTRICLE             IVC RV S prime:     12.40 cm/s  IVC diam: 1.90 cm TAPSE (M-mode): 2.2 cm LEFT ATRIUM             Index        RIGHT ATRIUM           Index LA diam:        3.60 cm 1.97 cm/m   RA Area:     18.30 cm LA Vol (A2C):   28.9 ml 15.83 ml/m  RA Volume:   50.30 ml  27.55 ml/m LA Vol (A4C):   30.3 ml 16.60 ml/m LA Biplane Vol: 30.0 ml 16.43 ml/m  AORTIC VALVE LVOT Vmax:   109.00 cm/s  LVOT Vmean:  71.300 cm/s LVOT VTI:    0.262 m  AORTA Ao Root diam: 3.20 cm Ao Asc diam:  3.20 cm MITRAL VALVE                TRICUSPID VALVE MV Area (PHT): 3.77 cm     TR Peak grad:   18.3 mmHg MV Decel Time: 201 msec      TR Vmax:        214.00 cm/s MV E velocity: 106.00 cm/s MV A velocity: 105.00 cm/s  SHUNTS MV E/A ratio:  1.01         Systemic VTI:  0.26 m                             Systemic Diam: 2.00 cm Nona Dell MD Electronically signed by Nona Dell MD Signature Date/Time: 09/28/2022/12:14:35 PM    Final    MR BRAIN WO CONTRAST  Result Date: 09/28/2022 CLINICAL DATA:  Aphasia EXAM: MRI HEAD WITHOUT CONTRAST TECHNIQUE: Multiplanar, multiecho pulse sequences of the brain and surrounding structures were obtained without intravenous contrast. COMPARISON:  No prior MRI, correlation is made with CT head 09/27/2022 FINDINGS: Brain: No restricted diffusion to suggest acute or subacute infarct. No acute hemorrhage, mass, mass effect, or midline shift. No hydrocephalus or extra-axial collection. No hemosiderin deposition to suggest remote hemorrhage. Scattered T2 hyperintense signal in the periventricular white matter and pons, likely the sequela of mild-to-moderate chronic small vessel ischemic disease. Vascular: Normal arterial flow voids. Skull and upper cervical spine: Normal marrow signal. Sinuses/Orbits: No acute finding. Status post bilateral lens replacements. Other: The mastoids are well aerated. IMPRESSION: No acute intracranial process. No evidence of acute or subacute infarct. Electronically Signed   By: Wiliam Ke M.D.   On: 09/28/2022 00:06   CT ANGIO HEAD NECK W WO CM  Result Date: 09/27/2022 CLINICAL DATA:  Expressive aphasia EXAM: CT ANGIOGRAPHY HEAD AND NECK TECHNIQUE: Multidetector CT imaging of the head and neck was performed using the standard protocol during bolus administration of intravenous contrast. Multiplanar CT image reconstructions and MIPs were obtained to evaluate the vascular anatomy. Carotid stenosis measurements (when applicable) are obtained utilizing NASCET criteria, using the distal internal carotid diameter as the denominator. RADIATION DOSE REDUCTION: This exam was performed  according to the departmental dose-optimization program which includes automated exposure control, adjustment of the mA and/or kV according to patient size and/or use of iterative reconstruction technique. CONTRAST:  30mL OMNIPAQUE IOHEXOL 350 MG/ML SOLN COMPARISON:  No prior CTA, correlation is made with CT head 09/27/2022 FINDINGS: CT HEAD FINDINGS For noncontrast findings, please see same day CT head. CTA NECK FINDINGS Aortic arch: Standard branching. Imaged portion shows no evidence of aneurysm or dissection. No significant stenosis of the major arch vessel origins. Right carotid system: No evidence of dissection, occlusion, or hemodynamically significant stenosis (greater than 50%). Atherosclerotic disease at the bifurcation and in the proximal ICA is not hemodynamically significant. Left carotid system: No evidence of dissection, occlusion, or hemodynamically significant stenosis (greater than 50%). Vertebral arteries: Evaluation of the origin of the right vertebral artery is somewhat limited by beam hardening artifact from the adjacent contrast bolus. Within this limitation, no evidence of dissection, occlusion, or hemodynamically significant stenosis (greater than 50%) in the bilateral vertebral arteries. Skeleton: No acute osseous abnormality. Degenerative changes in the cervical spine. Other neck: Small hypoenhancing nodules in the thyroid (< 1.5 cm), for which no follow-up is indicated. (  Reference: J Am Coll Radiol. 2015 Feb;12(2): 143-50) Upper chest: No focal pulmonary opacity or pleural effusion. Review of the MIP images confirms the above findings CTA HEAD FINDINGS Anterior circulation: Both internal carotid arteries are patent to the termini, with mild stenosis in the bilateral cavernous and right supraclinoid ICA. A1 segments patent. Normal anterior communicating artery. Anterior cerebral arteries are patent to their distal aspects. No M1 stenosis or occlusion. Trifurcation on the right. MCA  branches perfused and symmetric. Posterior circulation: Vertebral arteries patent to the vertebrobasilar junction without stenosis. Posterior inferior cerebellar arteries patent proximally. Basilar patent to its distal aspect. Superior cerebellar arteries patent proximally. Patent P1 segments. PCAs perfused to their distal aspects without stenosis. The bilateral posterior communicating arteries are patent. Venous sinuses: As permitted by contrast timing, patent. Anatomic variants: None significant. Review of the MIP images confirms the above findings IMPRESSION: 1. No intracranial large vessel occlusion. 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 2. No hemodynamically significant stenosis in the neck. Electronically Signed   By: Merilyn Baba M.D.   On: 09/27/2022 20:56   CT HEAD WO CONTRAST  Result Date: 09/27/2022 CLINICAL DATA:  Neuro deficit, acute, stroke suspected. Aphasia lasting about 20-30 minutes. Resolved at present EXAM: CT HEAD WITHOUT CONTRAST TECHNIQUE: Contiguous axial images were obtained from the base of the skull through the vertex without intravenous contrast. RADIATION DOSE REDUCTION: This exam was performed according to the departmental dose-optimization program which includes automated exposure control, adjustment of the mA and/or kV according to patient size and/or use of iterative reconstruction technique. COMPARISON:  CT head 02/08/2013 BRAIN: BRAIN Cerebral ventricle sizes are concordant with the degree of cerebral volume loss. Patchy and confluent areas of decreased attenuation are noted throughout the deep and periventricular white matter of the cerebral hemispheres bilaterally, compatible with chronic microvascular ischemic disease. No evidence of large-territorial acute infarction. No parenchymal hemorrhage. No mass lesion. No extra-axial collection. No mass effect or midline shift. No hydrocephalus. Basilar cisterns are patent. Vascular: No hyperdense  vessel. Skull: No acute fracture or focal lesion. Sinuses/Orbits: Paranasal sinuses and mastoid air cells are clear. Bilateral lens replacement. Otherwise the orbits are unremarkable. Other: None. IMPRESSION: No acute intracranial abnormality. Electronically Signed   By: Iven Finn M.D.   On: 09/27/2022 18:27    PHYSICAL EXAM General:  Alert, well-nourished, well-developed patient in no acute distress Respiratory:  Regular, unlabored respirations on room air  NEURO:  Mental Status: AA&Ox3  Speech/Language: speech is without dysarthria or aphasia.  Fluency, and comprehension intact.  Cranial Nerves:  II: PERRL. Visual fields full.  III, IV, VI: EOMI. Eyelids elevate symmetrically.  V: Sensation is intact to light touch and symmetrical to face.  VII: Smile is symmetrical.  VIII: hearing intact to voice. IX, X: Phonation is normal.  JO:INOMVEHM shrug 5/5. XII: tongue is midline without fasciculations. Motor: 5/5 strength to all muscle groups tested.  Tone: is normal and bulk is normal Sensation- Intact to light touch bilaterally.  Gait- deferred   ASSESSMENT/PLAN [name] is a 81 y.o. female with history of hyperlipidemia and cataracts presenting with an episode of left visual field cut in her left eye only which lasted a few minutes and resolved spontaneously, followed by two episodes of expressive aphasia lasting about 30 minutes and also resolving spontaneously.  Patient reports that she has always been healthy and has never had symptoms like this in the past.  TIA: one episode of ? Left BRAO and two  episodes of aphasia, concerning for cardioembolic source CT head No acute abnormality.  CTA head & neck no LVO, mild stenosis in bilateral cavernous and right supraclinoid ICA MRI  No acute abnormality 2D Echo EF 55-60%, grade 2 diastolic dysfunction, no atrial level shunt LDL 76 HgbA1c 4.7 VTE prophylaxis - lovenox aspirin 81 mg daily prior to admission, now on  aspirin 81 mg daily and clopidogrel 75 mg daily for three weeks followed by clopidogrel alone indefinitely Therapy recommendations:  no PT/OT follow up Disposition:  home  Hypertension Home meds:  none Stable Keep SBP <180 Long-term BP goal normotensive  Hyperlipidemia Home meds:  atorvastatin 20 mg daily LDL 76, goal < 70 increased to 40 mg daily Continue statin at discharge  Other Stroke Risk Factors Advanced Age >/= 26   Other Active Problems Hx of rheumatic fever when she was young  Hospital day # 0  Nancy Vasquez , MSN, AGACNP-BC [facility] See Amion for schedule and pager information 09/28/2022 3:31 PM  ATTENDING NOTE: I reviewed above note and agree with the assessment and plan. Pt was seen and examined.   81 year old female with history of hyperlipidemia, rheumatic fever when she was young admitted for separate episodes of partial left eye vision loss and aphasia.  She stated that yesterday evening she was coming out from [facility] and then she had a difficulty with the left eye on her cell phone.  She closed left eye and she was able to see well, however close right eye she was only see right half of the cell phone.  She thought it was because of her cataract surgery in the past and was not using eyedrops constantly.  She went back to her car and slept well her husband was driving.  When she woke up next, seem like her vision has resolved but she had difficulty with speaking, she knew what she wanted say but coming out with nonsense.  It was lasted about 30 minutes and resolved.  She called her PCPs office and was recommended to come to ED.  In ED she was asymptomatic.  So far stool work-up all negative with CT, CTA head and neck and MRI.  EF 55 to 60%, LDL 76, A1c 4.7, creatinine 0.81.  On exam, patient neurologically intact no focal deficit.  Her symptoms all located to left eye and left MCA territory, no vascular stenosis or unstable plaque on the  imaging, concerning for cardioembolic source such as paroxysmal A-fib.  Patient denies any heart palpitation, headache, shaking jerking, making seizure or complicated migraine less likely.  Recommend long-term cardiology monitoring.  Patient has her cardiologist Dr. Sharyn Lull.  We will ask Dr. Sharyn Lull to arrange for that.  Recommend DAPT for 3 weeks and then Plavix alone.  Increase Lipitor from 20-40.  We will follow-up at [facility] in 4 weeks.  For detailed assessment and plan, please refer to above/below as I have made changes wherever appropriate.   Neurology will sign off. Please call with questions. Pt will follow up with stroke clinic NP at [facility] in about 4 weeks. Thanks for the consult.  Marvel Plan, MD PhD Stroke Neurology 09/28/2022 4:44 PM       To contact Stroke Continuity provider, please refer to [email or web address]. After hours, contact General Neurology

## 2022-09-28 NOTE — ED Notes (Signed)
Pt husband provided supplies for pt to do bedside bath and brought lunch.

## 2022-09-29 ENCOUNTER — Ambulatory Visit
Admission: RE | Admit: 2022-09-29 | Discharge: 2022-09-29 | Disposition: A | Discharge status: Home / Self Care | Payer: Medicare Other | Source: Ambulatory Visit | Attending: Family Medicine | Admitting: Family Medicine

## 2022-09-29 DIAGNOSIS — Z1231 Encounter for screening mammogram for malignant neoplasm of breast: Secondary | ICD-10-CM | POA: Diagnosis not present

## 2022-09-30 DIAGNOSIS — Z23 Encounter for immunization: Secondary | ICD-10-CM | POA: Diagnosis not present

## 2022-09-30 DIAGNOSIS — G459 Transient cerebral ischemic attack, unspecified: Secondary | ICD-10-CM | POA: Diagnosis not present

## 2022-10-08 DIAGNOSIS — I25118 Atherosclerotic heart disease of native coronary artery with other forms of angina pectoris: Secondary | ICD-10-CM | POA: Diagnosis not present

## 2022-10-08 DIAGNOSIS — I10 Essential (primary) hypertension: Secondary | ICD-10-CM | POA: Diagnosis not present

## 2022-10-08 DIAGNOSIS — E785 Hyperlipidemia, unspecified: Secondary | ICD-10-CM | POA: Diagnosis not present

## 2022-10-08 DIAGNOSIS — E559 Vitamin D deficiency, unspecified: Secondary | ICD-10-CM | POA: Diagnosis not present

## 2022-10-22 ENCOUNTER — Telehealth: Payer: Self-pay | Admitting: Neurology

## 2022-10-22 NOTE — Telephone Encounter (Signed)
LVM and sent mychart msg informing pt of r/s needed for 10/31 appt- NP out. 

## 2022-10-28 ENCOUNTER — Inpatient Hospital Stay: Payer: Medicare Other | Admitting: Neurology

## 2022-10-30 DIAGNOSIS — Z23 Encounter for immunization: Secondary | ICD-10-CM | POA: Diagnosis not present

## 2022-10-31 DIAGNOSIS — T148XXA Other injury of unspecified body region, initial encounter: Secondary | ICD-10-CM | POA: Diagnosis not present

## 2022-11-06 ENCOUNTER — Encounter: Payer: Self-pay | Admitting: Neurology

## 2022-11-06 ENCOUNTER — Ambulatory Visit: Payer: Medicare Other | Admitting: Neurology

## 2022-11-06 VITALS — BP 145/81 | HR 66 | Ht 63.0 in | Wt 161.0 lb

## 2022-11-06 DIAGNOSIS — E785 Hyperlipidemia, unspecified: Secondary | ICD-10-CM

## 2022-11-06 DIAGNOSIS — R4701 Aphasia: Secondary | ICD-10-CM

## 2022-11-06 NOTE — Progress Notes (Signed)
Patient: Nancy Vasquez Date of Birth: 01/18/1941  Reason for Visit: Follow up for TIA History from: Patient Primary Neurologist: Dr. Pearlean Vasquez (Saw Dr. Roda Vasquez)  ASSESSMENT AND PLAN 81 y.o. year old female   1.  TIA, left branch retinal artery occlusion and 2 episodes of aphasia -Concerning for cardioembolic source, such as paroxysmal A-fib -Will forward note to Dr. Sharyn Vasquez to arrange for 30 day cardiac monitoring -Did 3 weeks aspirin 81 mg and Plavix 75 mg daily, now Plavix 75 mg daily  2.  Hypertension -BP goal less than 130/90 -Continue to monitor, keep a log  3.  Hyperlipidemia -LDL 76, goal less than 70 -Atorvastatin increased to 40 mg daily -Have PCP recheck next month  I will see her back in 4 months to ensure cardiac monitoring has been completed.  I will forward a note over to Dr. Sharyn Vasquez.  If patient cannot arrange, I will order.  For any acute stroke symptoms she should go to the ER.  HISTORY OF PRESENT ILLNESS: Today 11/06/22 Nancy Vasquez is here today for TIA follow-up. On 09/27/22 had episodes of left visual field cut to her left eye for a few minutes then resolved, followed by 2 episodes of expressive aphasia for 30 minutes, then resolving. She had no lingering deficits. Was on aspirin 81 mg at the time. Is now on just Plavix. Taking increased Lipitor 40 mg daily. Has seen Dr. Sharyn Vasquez, no mention of cardiac monitoring. Dr. Roda Vasquez had recommended long time cardiac monitoring. 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 Her husband has MM, but is doing well. She has no problems or concerns. Is using cane, no falls. She drives. Has cataract to left eye, uses eye drops. BP slightly up today, but checks at home, usually runs in 115's. She feels she has noticed less creasing to the left mouth since the TIA.  -CT head showed no acute abnormality -CTA head and neck no LVO, mild stenosis in bilateral cavernous and right supraclinoid ICA -MRI of the brain no acute  abnormality -2D echo EF 55 to 60%, grade 2 diastolic dysfunction -LDL 76 -G9Q 4.7 -Aspirin 81 mg daily alone prior to admission, after admission aspirin 81 mg daily and Plavix 75 mg daily for 3 weeks followed by Plavix alone  HISTORY  09/27/22 Dr. Amada Vasquez HPI: Nancy Vasquez is a 81 y.o. female with a history of hypercholesterolemia who presents with an episode of transient visual change followed by aphasia.  She states that the first thing that she noticed was that she was having trouble seeing out of the left side of her left eye.  She does report that she tried closing each eye sequentially and indeed it seems to be only affecting the left eye.  This improved over a 1/2-hour but then subsequently little while after it improved, she began having difficulty speaking.  Husband reports that it was formed words, but the words did not make sense in the context.  She states that she knew what she wanted to say but wrong words kept coming out.  This lasted for about 15 minutes.   She has never had anything like this before.  And due to the symptoms she came in for further evaluation.   LKW: 1245 tpa given?: no, resolution of symptoms  REVIEW OF SYSTEMS: Out of a complete 14 system review of symptoms, the patient complains only of the following symptoms, and all other reviewed systems are negative.  See HPI  ALLERGIES: Allergies  Allergen Reactions   Ibuprofen Nausea And Vomiting and Other (See Comments)    "burns stomach"   Oxycodone Nausea Only    "Burns stomach"    HOME MEDICATIONS: Outpatient Medications Prior to Visit  Medication Sig Dispense Refill   atorvastatin (LIPITOR) 40 MG tablet Take 1 tablet (40 mg total) by mouth daily. 30 tablet 0   clopidogrel (PLAVIX) 75 MG tablet Take 1 tablet (75 mg total) by mouth daily. 30 tablet 1   No facility-administered medications prior to visit.    PAST MEDICAL HISTORY: Past Medical History:  Diagnosis Date   Arthritis    has had  right hip replacement   Coronary artery disease 09/27/2022    PAST SURGICAL HISTORY: Past Surgical History:  Procedure Laterality Date   ABDOMINAL HYSTERECTOMY     BREAST EXCISIONAL BIOPSY Left    INTRAVASCULAR PRESSURE WIRE/FFR STUDY N/A 03/17/2017   Procedure: Intravascular Pressure Wire/FFR Study;  Surgeon: Rinaldo Cloud, MD;  Location: [facility] INVASIVE CV LAB;  Service: Cardiovascular;  Laterality: N/A;  mid LAD   LEFT HEART CATH AND CORONARY ANGIOGRAPHY N/A 03/17/2017   Procedure: Left Heart Cath and Coronary Angiography;  Surgeon: Rinaldo Cloud, MD;  Location: [facility] INVASIVE CV LAB;  Service: Cardiovascular;  Laterality: N/A;   TONSILLECTOMY     Removed as a child   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   Procedure: LEFT TOTAL KNEE ARTHROPLASTY;  Surgeon: Cammy Copa, MD;  Location: [facility] OR;  Service: Orthopedics;  Laterality: Left;    FAMILY HISTORY: Family History  Problem Relation Age of Onset   Breast cancer Sister 81       metastatic breast ca    SOCIAL HISTORY: Social History   Socioeconomic History   Marital status: Married    Spouse name: Not on file   Number of children: Not on file   Years of education: Not on file   Highest education level: Not on file  Occupational History   Not on file  Tobacco Use   Smoking status: Never   Smokeless tobacco: Never  Substance and Sexual Activity   Alcohol use: Never   Drug use: Never   Sexual activity: Not on file  Other Topics Concern   Not on file  Social History Narrative   Not on file   Social Determinants of Health   Financial Resource Strain: Not on file  Food Insecurity: Not on file  Transportation Needs: Not on file  Physical Activity: Not on file  Stress: Not on file  Social Connections: Not on file  Intimate Partner Violence: Not on file   PHYSICAL EXAM  Vitals:   11/06/22 0827  BP: (!) 145/81  Pulse: 66  Weight: 161 lb (73 kg)  Height: 5\' 3"  (1.6 m)   Body mass index is 28.52  kg/m.  Generalized: Well developed, in no acute distress  Neurological examination  Mentation: Alert oriented to time, place, history taking. Follows all commands speech and language fluent Cranial nerve II-XII: Pupils were equal round reactive to light. Extraocular movements were full, visual field were full on confrontational test. Facial sensation and strength were normal. Head turning and shoulder shrug  were normal and symmetric.  I do not appreciate any facial asymmetry. Motor: Good strength throughout, exception 4/5 left knee flexion extension Sensory: Sensory testing is intact to soft touch on all 4 extremities. No evidence of extinction is noted.  Coordination: Cerebellar testing reveals good finger-nose-finger and heel-to-shin bilaterally.  Gait and station: Limp on  the left, uses cane in the hallway, wearing platform sandals Reflexes: Deep tendon reflexes are symmetric and normal bilaterally.   DIAGNOSTIC DATA (LABS, IMAGING, TESTING) - I reviewed patient records, labs, notes, testing and imaging myself where available.  Lab Results  Component Value Date   WBC 6.6 09/28/2022   HGB 11.6 (L) 09/28/2022   HCT 36.6 09/28/2022   MCV 95.1 09/28/2022   PLT 250 09/28/2022      Component Value Date/Time   NA 139 09/28/2022 0525   K 3.8 09/28/2022 0525   CL 108 09/28/2022 0525   CO2 24 09/28/2022 0525   GLUCOSE 100 (H) 09/28/2022 0525   BUN 10 09/28/2022 0525   CREATININE 0.81 09/28/2022 0525   CALCIUM 9.0 09/28/2022 0525   PROT 7.0 09/27/2022 1709   ALBUMIN 3.7 09/27/2022 1709   AST 15 09/27/2022 1709   ALT 14 09/27/2022 1709   ALKPHOS 50 09/27/2022 1709   BILITOT 0.5 09/27/2022 1709   GFRNONAA >60 09/28/2022 0525   GFRAA >60 04/23/2020 1941   Lab Results  Component Value Date   CHOL 152 09/28/2022   HDL 68 09/28/2022   LDLCALC 76 09/28/2022   TRIG 40 09/28/2022   CHOLHDL 2.2 09/28/2022   Lab Results  Component Value Date   HGBA1C 4.7 (L) 09/28/2022   No  results found for: "VITAMINB12" No results found for: "TSH"  Margie Ege, AGNP-C, DNP 11/06/2022, 8:58 AM [facility] [address] [place], [place] [address] [phone]

## 2022-11-06 NOTE — Patient Instructions (Addendum)
I will send a note to Dr. Sharyn Lull about arranging cardiac monitoring, if you don't hear from them, call me and I will order   Continue the Plavix, goal BP < 130/90, LDL < 70, A1C < 7.0  Continue your Lipitor, make sure your primary care doctor rechecks  See you back in 4 months   For any acute stroke symptoms go to the ER

## 2022-11-07 ENCOUNTER — Ambulatory Visit: Payer: Medicare Other | Admitting: Surgical

## 2022-11-07 ENCOUNTER — Ambulatory Visit (INDEPENDENT_AMBULATORY_CARE_PROVIDER_SITE_OTHER): Payer: Medicare Other

## 2022-11-07 DIAGNOSIS — M5410 Radiculopathy, site unspecified: Secondary | ICD-10-CM | POA: Diagnosis not present

## 2022-11-07 DIAGNOSIS — R6884 Jaw pain: Secondary | ICD-10-CM | POA: Diagnosis not present

## 2022-11-07 DIAGNOSIS — Z96652 Presence of left artificial knee joint: Secondary | ICD-10-CM

## 2022-11-07 NOTE — Progress Notes (Signed)
I agree with the above plan 

## 2022-11-09 ENCOUNTER — Encounter: Payer: Self-pay | Admitting: Surgical

## 2022-11-09 NOTE — Progress Notes (Signed)
Office Visit Note   Patient: Nancy Vasquez           Date of Birth: 02-19-1941           MRN: [id number] Visit Date: 11/07/2022 Requested by: Macy Mis, MD [address] [place],  [place] [address] PCP: Macy Mis, MD  Subjective: Chief Complaint  Patient presents with   Left Knee - Pain    HPI: Nancy Vasquez is a 81 y.o. female who presents to the office reporting left knee pain.  She has history of left total knee arthroplasty by Dr. August Saucer in April 2021.  She notes she has good and bad days.  Occasionally will walk with a cane just to give herself some extra support.  Mostly localizes pain to the lateral aspect of the knee and extending down into the anterior lateral shin that radiates down to the ankle.  She describes her pain as a burning pain.  Is not constant.  Is worse at night.  No mechanical symptoms.  Denies any groin pain.  Does have occasional low back pain that is intermittent.  No numbness or tingling.  No fevers or chills.  No drainage from her incision.  Knee is not giving out on her..                ROS: All systems reviewed are negative as they relate to the chief complaint within the history of present illness.  Patient denies fevers or chills.  Assessment & Plan: Visit Diagnoses:  1. Status post total left knee replacement   2. Radicular pain of left lower extremity     Plan: Patient is a 81 year old female who presents for evaluation of left knee pain.  She has left knee radiographs taken today that demonstrate left knee prosthesis in good position and alignment without any complicating features.  There is no obvious evidence of loosening.  No periprosthetic fracture.  Most of her pain she describes is actually in the anterior lateral shin with a burning quality.  Not really any reproduction of pain with knee range of motion.  She has had similar pain to this in the past with her last visit in June 2023.  At that time she was having  severe burning quality sensation from her thigh down to her ankle.  She has previous radiographs of the lumbar spine in 2022 demonstrating moderate degenerative changes at multiple levels with grade 1 spondylolisthesis L4-L5.  With the continued burning quality of her pain and the history of intermittent low back pain and degenerative changes noted on lumbar spine radiographs previously, plan to further evaluate for radicular pain from the lumbar spine with MRI.  Follow-up after MRI to review results.  She has failed conservative management with no significant improvement in the quality of her pain since last visit in June 2023.  She also has history of significant left hip osteoarthritis but none of her current pain is reproduced with hip range of motion.  Follow-up after MRI.  Follow-Up Instructions: No follow-ups on file.   Orders:  Orders Placed This Encounter  Procedures   XR KNEE 3 VIEW LEFT   MR Lumbar Spine w/o contrast   No orders of the defined types were placed in this encounter.     Procedures: No procedures performed   Clinical Data: No additional findings.  Objective: Vital Signs: There were no vitals taken for this visit.  Physical Exam:  Constitutional: Patient appears well-developed HEENT:  Head: Normocephalic Eyes:EOM are normal Neck: Normal range of motion Cardiovascular: Normal rate Pulmonary/chest: Effort normal Neurologic: Patient is alert Skin: Skin is warm Psychiatric: Patient has normal mood and affect  Ortho Exam: Ortho exam demonstrates left knee without effusion.  Incision is well-healed from prior total knee arthroplasty.  She has no calf tenderness.  Negative Homans' sign.  Intact hip flexion, quadricep, hamstring, dorsiflexion, plantarflexion rated 5/5.  No pain with hip range of motion though she does have decreased hip internal rotation.  Incision is well-healed without any evidence of infection or dehiscence.  There is no sinus tract noted.  She  has no warmth overlying the left knee.  She has range of motion from about 0 degrees extension to 110 degrees of knee flexion.  No gross mid flexion instability noted.  She has excellent stability to varus and valgus stress at 0 and 30 degrees.  Specialty Comments:  No specialty comments available.  Imaging: No results found.   PMFS History: Patient Active Problem List   Diagnosis Date Noted   HLD (hyperlipidemia) 11/06/2022   Expressive aphasia 09/27/2022   Coronary artery disease 09/27/2022   Arthritis of left knee 04/19/2020   Past Medical History:  Diagnosis Date   Arthritis    has had right hip replacement   Coronary artery disease 09/27/2022    Family History  Problem Relation Age of Onset   Breast cancer Sister 59       metastatic breast ca    Past Surgical History:  Procedure Laterality Date   ABDOMINAL HYSTERECTOMY     BREAST EXCISIONAL BIOPSY Left    INTRAVASCULAR PRESSURE WIRE/FFR STUDY N/A 03/17/2017   Procedure: Intravascular Pressure Wire/FFR Study;  Surgeon: Rinaldo Cloud, MD;  Location: [facility] INVASIVE CV LAB;  Service: Cardiovascular;  Laterality: N/A;  mid LAD   LEFT HEART CATH AND CORONARY ANGIOGRAPHY N/A 03/17/2017   Procedure: Left Heart Cath and Coronary Angiography;  Surgeon: Rinaldo Cloud, MD;  Location: [facility] INVASIVE CV LAB;  Service: Cardiovascular;  Laterality: N/A;   TONSILLECTOMY     Removed as a child   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   Procedure: LEFT TOTAL KNEE ARTHROPLASTY;  Surgeon: Cammy Copa, MD;  Location: [facility] OR;  Service: Orthopedics;  Laterality: Left;   Social History   Occupational History   Not on file  Tobacco Use   Smoking status: Never   Smokeless tobacco: Never  Substance and Sexual Activity   Alcohol use: Never   Drug use: Never   Sexual activity: Not on file

## 2022-11-13 DIAGNOSIS — G459 Transient cerebral ischemic attack, unspecified: Secondary | ICD-10-CM | POA: Diagnosis not present

## 2022-11-13 DIAGNOSIS — I10 Essential (primary) hypertension: Secondary | ICD-10-CM | POA: Diagnosis not present

## 2022-11-13 DIAGNOSIS — Z0000 Encounter for general adult medical examination without abnormal findings: Secondary | ICD-10-CM | POA: Diagnosis not present

## 2022-11-13 DIAGNOSIS — R002 Palpitations: Secondary | ICD-10-CM | POA: Diagnosis not present

## 2022-11-13 DIAGNOSIS — E559 Vitamin D deficiency, unspecified: Secondary | ICD-10-CM | POA: Diagnosis not present

## 2022-12-06 ENCOUNTER — Ambulatory Visit
Admission: RE | Admit: 2022-12-06 | Discharge: 2022-12-06 | Disposition: A | Discharge status: Home / Self Care | Payer: Medicare Other | Source: Ambulatory Visit | Attending: Orthopedic Surgery | Admitting: Orthopedic Surgery

## 2022-12-06 DIAGNOSIS — M47816 Spondylosis without myelopathy or radiculopathy, lumbar region: Secondary | ICD-10-CM | POA: Diagnosis not present

## 2022-12-06 DIAGNOSIS — M545 Low back pain, unspecified: Secondary | ICD-10-CM | POA: Diagnosis not present

## 2022-12-06 DIAGNOSIS — M5410 Radiculopathy, site unspecified: Secondary | ICD-10-CM

## 2022-12-06 DIAGNOSIS — M5136 Other intervertebral disc degeneration, lumbar region: Secondary | ICD-10-CM | POA: Diagnosis not present

## 2022-12-06 DIAGNOSIS — M4316 Spondylolisthesis, lumbar region: Secondary | ICD-10-CM | POA: Diagnosis not present

## 2022-12-11 DIAGNOSIS — I10 Essential (primary) hypertension: Secondary | ICD-10-CM | POA: Diagnosis not present

## 2022-12-11 DIAGNOSIS — E559 Vitamin D deficiency, unspecified: Secondary | ICD-10-CM | POA: Diagnosis not present

## 2022-12-11 DIAGNOSIS — G459 Transient cerebral ischemic attack, unspecified: Secondary | ICD-10-CM | POA: Diagnosis not present

## 2022-12-11 DIAGNOSIS — Z0000 Encounter for general adult medical examination without abnormal findings: Secondary | ICD-10-CM | POA: Diagnosis not present

## 2022-12-12 ENCOUNTER — Encounter: Payer: Self-pay | Admitting: Surgical

## 2022-12-12 ENCOUNTER — Ambulatory Visit: Payer: Medicare Other | Admitting: Surgical

## 2022-12-12 DIAGNOSIS — M5410 Radiculopathy, site unspecified: Secondary | ICD-10-CM | POA: Diagnosis not present

## 2022-12-12 NOTE — Progress Notes (Signed)
Office Visit Note   Patient: Nancy Vasquez           Date of Birth: 1941/01/29           MRN: [id number] Visit Date: 12/12/2022 Requested by: Charolette Forward, MD [phone] [address] [place],  [place] [address] PCP: Charolette Forward, MD  Subjective: Chief Complaint  Patient presents with   Lower Back - Pain    HPI: Nancy Vasquez is a 81 y.o. female who presents to the office for MRI review. Patient denies any changes in symptoms.  Continues to complain mainly of left leg radicular pain that primarily bothers her down the left leg into the left knee and occasionally down the left ankle.  Overall pain is not too bad for her at this point where she does not want to consider any intervention.  MRI results revealed: MR Lumbar Spine w/o contrast  Result Date: 12/08/2022 CLINICAL DATA:  Left radicular lower extremity pain. Fall 3 weeks ago. EXAM: MRI LUMBAR SPINE WITHOUT CONTRAST TECHNIQUE: Multiplanar, multisequence MR imaging of the lumbar spine was performed. No intravenous contrast was administered. COMPARISON:  Radiographs 10/24/2021 and report from lumbar MRI 10/20/2014 FINDINGS: Segmentation: The lowest completely lumbar type non-rib-bearing vertebra is labeled as L5. There is some disc material at the S1-2 level with a slightly transitional S1. Alignment:  4 mm degenerative anterolisthesis at L4-5. Vertebrae: Disc desiccation at L3-4 and L4-5. Type 2 degenerative endplate findings at [id number], L4-5, and especially L5-S1. Conus medullaris and cauda equina: Conus extends to the L1-2 level. Conus and cauda equina appear normal. Paraspinal and other soft tissues: Sigmoid colon diverticulosis. Disc levels: T12-L1: No impingement.  Small central disc protrusion. L1-2: No impingement.  Mild disc bulge. L2-3: Borderline left sub articular lateral recess stenosis due to disc bulge. L3-4: Moderate to prominent central narrowing of the thecal sac with mild bilateral foraminal stenosis and  moderate bilateral subarticular lateral recess stenosis due to disc bulge, facet arthropathy, and ligamentum flavum redundancy. L4-5: Moderate central narrowing of the thecal sac with moderate right mild left foraminal stenosis and mild bilateral subarticular lateral recess stenosis due to facet arthropathy, ligamentum flavum redundancy, disc uncovering, and disc bulge. L5-S1: Mild left foraminal stenosis due to facet spurring. Central disc protrusion noted. S1-2: Rudimentary disc material, no impingement. IMPRESSION: 1. Lumbar spondylosis and degenerative disc disease, causing moderate to prominent impingement at L3-4; moderate impingement at L4-5; and mild impingement at L5-S1. 2. 4 mm of degenerative anterolisthesis at L4-5. 3. Sigmoid colon diverticulosis. 4. Slightly transitional S1 with some disc material at S1-2, but with S1 fused in with the sacrum laterally. Electronically Signed   By: Van Clines M.D.   On: 12/08/2022 14:06                 ROS: All systems reviewed are negative as they relate to the chief complaint within the history of present illness.  Patient denies fevers or chills.  Assessment & Plan: Visit Diagnoses:  1. Radicular pain of left lower extremity     Plan: Nancy Vasquez is a 81 y.o. female who presents to the office for review of MRI lumbar spine.  MRI demonstrates moderate to prominent impingement at L3-L4 with moderate impingement at L4-L5.  Suspect that this is likely responsible for the left knee pain that she will intermittently complain about.  Currently this is not bothering her enough to consider ESI and she would like to just live with her symptoms for now.  Recommended she call the office to try [facility] for diagnostic and therapeutic purposes when her knee pain or left leg radicular pain flares up.  Patient agreed with plan.  Follow-up with the office as needed.  Follow-Up Instructions: No follow-ups on file.   Orders:  No orders of the defined types were  placed in this encounter.  No orders of the defined types were placed in this encounter.     Procedures: No procedures performed   Clinical Data: No additional findings.  Objective: Vital Signs: There were no vitals taken for this visit.  Physical Exam:  Constitutional: Patient appears well-developed HEENT:  Head: Normocephalic Eyes:EOM are normal Neck: Normal range of motion Cardiovascular: Normal rate Pulmonary/chest: Effort normal Neurologic: Patient is alert Skin: Skin is warm Psychiatric: Patient has normal mood and affect  Ortho Exam: Ortho exam demonstrates 5/5 motor strength of left hip flexion, quad, hamstring, dorsiflexion, plantarflexion.  No clonus present bilaterally.  Specialty Comments:  No specialty comments available.  Imaging: No results found.   PMFS History: Patient Active Problem List   Diagnosis Date Noted   HLD (hyperlipidemia) 11/06/2022   Expressive aphasia 09/27/2022   Coronary artery disease 09/27/2022   Arthritis of left knee 04/19/2020   Past Medical History:  Diagnosis Date   Arthritis    has had right hip replacement   Coronary artery disease 09/27/2022    Family History  Problem Relation Age of Onset   Breast cancer Sister 22       metastatic breast ca    Past Surgical History:  Procedure Laterality Date   ABDOMINAL HYSTERECTOMY     BREAST EXCISIONAL BIOPSY Left    INTRAVASCULAR PRESSURE WIRE/FFR STUDY N/A 03/17/2017   Procedure: Intravascular Pressure Wire/FFR Study;  Surgeon: Rinaldo Cloud, MD;  Location: [facility] INVASIVE CV LAB;  Service: Cardiovascular;  Laterality: N/A;  mid LAD   LEFT HEART CATH AND CORONARY ANGIOGRAPHY N/A 03/17/2017   Procedure: Left Heart Cath and Coronary Angiography;  Surgeon: Rinaldo Cloud, MD;  Location: [facility] INVASIVE CV LAB;  Service: Cardiovascular;  Laterality: N/A;   TONSILLECTOMY     Removed as a child   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   Procedure: LEFT TOTAL KNEE ARTHROPLASTY;  Surgeon:  Cammy Copa, MD;  Location: [facility] OR;  Service: Orthopedics;  Laterality: Left;   Social History   Occupational History   Not on file  Tobacco Use   Smoking status: Never   Smokeless tobacco: Never  Substance and Sexual Activity   Alcohol use: Never   Drug use: Never   Sexual activity: Not on file

## 2022-12-14 ENCOUNTER — Encounter (HOSPITAL_COMMUNITY): Payer: Self-pay | Admitting: Emergency Medicine

## 2022-12-14 ENCOUNTER — Emergency Department (HOSPITAL_COMMUNITY)
Admission: EM | Admit: 2022-12-14 | Discharge: 2022-12-14 | Disposition: A | Discharge status: Home / Self Care | Payer: Medicare Other | Attending: Emergency Medicine | Admitting: Emergency Medicine

## 2022-12-14 ENCOUNTER — Other Ambulatory Visit: Payer: Self-pay

## 2022-12-14 DIAGNOSIS — H6123 Impacted cerumen, bilateral: Secondary | ICD-10-CM | POA: Diagnosis not present

## 2022-12-14 DIAGNOSIS — R519 Headache, unspecified: Secondary | ICD-10-CM | POA: Insufficient documentation

## 2022-12-14 DIAGNOSIS — R001 Bradycardia, unspecified: Secondary | ICD-10-CM | POA: Diagnosis not present

## 2022-12-14 LAB — CBC WITH DIFFERENTIAL/PLATELET
Abs Immature Granulocytes: 0.02 10*3/uL (ref 0.00–0.07)
Basophils Absolute: 0 10*3/uL (ref 0.0–0.1)
Basophils Relative: 0 %
Eosinophils Absolute: 0.2 10*3/uL (ref 0.0–0.5)
Eosinophils Relative: 3 %
HCT: 38.1 % (ref 36.0–46.0)
Hemoglobin: 12 g/dL (ref 12.0–15.0)
Immature Granulocytes: 0 %
Lymphocytes Relative: 33 %
Lymphs Abs: 1.6 10*3/uL (ref 0.7–4.0)
MCH: 30.1 pg (ref 26.0–34.0)
MCHC: 31.5 g/dL (ref 30.0–36.0)
MCV: 95.5 fL (ref 80.0–100.0)
Monocytes Absolute: 0.6 10*3/uL (ref 0.1–1.0)
Monocytes Relative: 11 %
Neutro Abs: 2.6 10*3/uL (ref 1.7–7.7)
Neutrophils Relative %: 53 %
Platelets: 269 10*3/uL (ref 150–400)
RBC: 3.99 MIL/uL (ref 3.87–5.11)
RDW: 12.2 % (ref 11.5–15.5)
WBC: 4.9 10*3/uL (ref 4.0–10.5)
nRBC: 0 % (ref 0.0–0.2)

## 2022-12-14 LAB — COMPREHENSIVE METABOLIC PANEL
ALT: 16 U/L (ref 0–44)
AST: 22 U/L (ref 15–41)
Albumin: 3.6 g/dL (ref 3.5–5.0)
Alkaline Phosphatase: 48 U/L (ref 38–126)
Anion gap: 7 (ref 5–15)
BUN: 8 mg/dL (ref 8–23)
CO2: 25 mmol/L (ref 22–32)
Calcium: 9.2 mg/dL (ref 8.9–10.3)
Chloride: 108 mmol/L (ref 98–111)
Creatinine, Ser: 0.74 mg/dL (ref 0.44–1.00)
GFR, Estimated: >60 mL/min (ref >60)
Glucose, Bld: 112 mg/dL — ABNORMAL HIGH (ref 70–99)
Potassium: 4.9 mmol/L (ref 3.5–5.1)
Sodium: 140 mmol/L (ref 135–145)
Total Bilirubin: 1.2 mg/dL (ref 0.3–1.2)
Total Protein: 6.7 g/dL (ref 6.5–8.1)

## 2022-12-14 LAB — TROPONIN I (HIGH SENSITIVITY): Troponin I (High Sensitivity): 4 ng/L (ref <18)

## 2022-12-14 MED ORDER — CARBAMIDE PEROXIDE 6.5 % OT SOLN: 5.0000 [drp] | Freq: Two times a day (BID) | OTIC | 0 refills | Status: DC

## 2022-12-14 NOTE — Unmapped (Signed)
Formatting of this note is different from the original.  Emergency Medicine Provider Triage Evaluation Note    Kristi Orozco , a 81 y.o. female  was evaluated in triage.  Pt complains of left jaw pain and numbness.  Patient states that she was feeding trail mix approximately around 9 PM last night when she developed left-sided jaw pain and numbness.  She states that since then, pain has significantly improved.  Pain is worsened with movement of her jaw and is relieved with rest.  Denies any dental pain, chest pain, shortness of breath, headache.    Review of Systems   Positive: See above  Negative:     Physical Exam   BP 128/88   Pulse (!) 57   Temp 98.7 F (37.1 C) (Oral)   Resp 16   SpO2 99%   Gen:   Awake, no distress    Resp:  Normal effort   MSK:   Moves extremities without difficulty   Other:  Cranial nerves III through XII grossly intact.  Patient is tender to palpation of left submandibular region with associated overlying reported numbness.  No obvious external swelling, overlying skin changes.  No left TMJ tenderness.    Medical Decision Making   Medically screening exam initiated at 9:13 AM.  Appropriate orders placed.  [name] was informed that the remainder of the evaluation will be completed by another provider, this initial triage assessment does not replace that evaluation, and the importance of remaining in the ED until their evaluation is complete.      Wilnette Kales, [place]  12/14/22 0930    Electronically signed by Tretha Sciara, MD at 12/14/2022 10:04 AM EST

## 2022-12-14 NOTE — ED Triage Notes (Signed)
Formatting of this note might be different from the original.  Patient from home w/ jaw pain and numbness to the left side that started last night at 9 p.m. Patient states she couldn't even eat her trail mix because it hurt so pain. Patient denies chest pain, shob.    Electronically signed by Eligah East, RN at 12/14/2022  8:54 AM EST

## 2022-12-14 NOTE — ED Provider Notes (Signed)
Formatting of this note is different from the original.  Images from the original note were not included.    [facility] EMERGENCY DEPARTMENT  Provider Note    CSN: [id number]  Arrival date & time: 12/14/22  6546        History    Chief Complaint   Patient presents with    Jaw Pain    Numbness     Kristi Orozco is a 81 y.o. female presenting to the ED complaining of left upper jaw pain and facial numbness.  She reports onset yesterday evening.  She reports was a painful lump on her left ear, and she has been having pain with mastication since last night.  She said she felt some tingling on the left half of her face as well.  She denies any chest pain or pressure.  Denies history of MI or coronary disease.  She otherwise feels well.    HPI        Home Medications  Prior to Admission medications    Medication Sig Start Date End Date Taking? Authorizing Provider   carbamide peroxide (DEBROX) 6.5 % OTIC solution Place 5 drops into both ears 2 (two) times daily. 12/14/22  Yes Terald Sleeper, MD   atorvastatin (LIPITOR) 40 MG tablet Take 1 tablet (40 mg total) by mouth daily. 09/29/22 11/06/22  Barnetta Chapel, MD       Allergies     Ibuprofen and Oxycodone      Review of Systems    Review of Systems    Physical Exam  Updated Vital Signs  BP 128/88   Pulse (!) 57   Temp 98.7 F (37.1 C) (Oral)   Resp 16   SpO2 99%   Physical Exam  Constitutional:       General: She is not in acute distress.  HENT:      Head: Normocephalic and atraumatic.      Comments: Posterior auricle minor lymphadenopathy, tender  Ears are impacted with cerumen bilaterally    Eyes:      Conjunctiva/sclera: Conjunctivae normal.      Pupils: Pupils are equal, round, and reactive to light.   Cardiovascular:      Rate and Rhythm: Normal rate and regular rhythm.   Pulmonary:      Effort: Pulmonary effort is normal. No respiratory distress.   Skin:     General: Skin is warm and dry.   Neurological:      General: No focal deficit  present.      Mental Status: She is alert and oriented to person, place, and time. Mental status is at baseline.      Cranial Nerves: No cranial nerve deficit.      Sensory: No sensory deficit.      Motor: No weakness.   Psychiatric:         Mood and Affect: Mood normal.         Behavior: Behavior normal.     ED Results / Procedures / Treatments    Labs  (all labs ordered are listed, but only abnormal results are displayed)  Labs Reviewed   COMPREHENSIVE METABOLIC PANEL - Abnormal; Notable for the following components:       Result Value    Glucose, Bld 112 (*)     All other components within normal limits   CBC WITH DIFFERENTIAL/PLATELET   TROPONIN I (HIGH SENSITIVITY)     EKG  EKG Interpretation    Date/Time:  "Sunday December 14 2022 08:56:09 EST  Ventricular Rate:  55  PR Interval:  168  QRS Duration: 88  QT Interval:  432  QTC Calculation: 413  R Axis:   43  Text Interpretation: Sinus bradycardia with sinus arrhythmia Otherwise normal ECG When compared with ECG of 27-Sep-2022 17:02, PREVIOUS ECG IS PRESENT Confirmed by Countryman, Chase ([id number]) on 12/14/2022 9:11:10 AM    Radiology  No results found.    Procedures  Procedures     Medications Ordered in ED  Medications - No data to display    ED Course/ Medical Decision Making/ A&P      Medical Decision Making    Patient is here with left-sided jaw pain and left facial paresthesia and tender lymphadenopathy.  She also has impacted external auditory canals.  The differential for her symptoms could include otitis media or middle ear effusion, versus symptomatic lymphadenopathy, versus trigeminal neuralgia.  Have a very low suspicion at this time that this is consistent with a TIA, stroke, intracranial lesion, or vascular insult, or coronary cause.  I personally reviewed and interpreted the patient's labs and EKG from arrival which are benign, normal troponin, no acute ischemic findings on EKG.  I do not see an indication for emergent imaging of the brain at this  time.    The patient does not want to try NSAIDs because they are upsetting to her stomach.  She does not want to try any medications as her symptoms are fairly minor, and she only came because she was anxious about a more serious medical condition.  I do recommend Debrox eardrops her ears and would have her call her PCP for follow-up on Monday.  She verbalized understanding.  Her husband was also present for the entire history and exam and to provide supplemental history.  She is stable for discharge at this time.    Final Clinical Impression(s) / ED Diagnoses  Final diagnoses:   Bilateral impacted cerumen   Left facial pain     Rx / DC Orders  ED Discharge Orders            Ordered     carbamide peroxide (DEBROX) 6.5 % OTIC solution  2 times daily         12" /17/23 1157                 Wyvonnia Dusky, MD  12/14/22 1158    Electronically signed by Wyvonnia Dusky, MD at 12/14/2022 11:58 AM EST

## 2022-12-14 NOTE — Discharge Instructions (Addendum)
Please use the eardrops as instructed to clean out your ears.  Call your doctors office on Monday to schedule a follow-up appointment.  You can use Tylenol if you are having worsening pain.  If you begin to develop new symptoms which include chest pressure, difficulty breathing, numbness or weakness of your arms or legs, sudden balance problems, or severe headache, please turn to the emergency department.

## 2022-12-14 NOTE — ED Provider Notes (Signed)
[facility] EMERGENCY DEPARTMENT Provider Note   CSN: [id number] Arrival date & time: 12/14/22  7829     History  Chief Complaint  Patient presents with   Jaw Pain   Numbness    Nancy Vasquez is a 81 y.o. female presenting to the ED complaining of left upper jaw pain and facial numbness.  She reports onset yesterday evening.  She reports was a painful lump on her left ear, and she has been having pain with mastication since last night.  She said she felt some tingling on the left half of her face as well.  She denies any chest pain or pressure.  Denies history of MI or coronary disease.  She otherwise feels well.  HPI     Home Medications Prior to Admission medications   Medication Sig Start Date End Date Taking? Authorizing Provider  carbamide peroxide (DEBROX) 6.5 % OTIC solution Place 5 drops into both ears 2 (two) times daily. 12/14/22  Yes Terald Sleeper, MD  atorvastatin (LIPITOR) 40 MG tablet Take 1 tablet (40 mg total) by mouth daily. 09/29/22 11/06/22  Barnetta Chapel, MD      Allergies    Ibuprofen and Oxycodone    Review of Systems   Review of Systems  Physical Exam Updated Vital Signs BP 128/88   Pulse (!) 57   Temp 98.7 F (37.1 C) (Oral)   Resp 16   SpO2 99%  Physical Exam Constitutional:      General: She is not in acute distress. HENT:     Head: Normocephalic and atraumatic.     Comments: Posterior auricle minor lymphadenopathy, tender Ears are impacted with cerumen bilaterally  Eyes:     Conjunctiva/sclera: Conjunctivae normal.     Pupils: Pupils are equal, round, and reactive to light.  Cardiovascular:     Rate and Rhythm: Normal rate and regular rhythm.  Pulmonary:     Effort: Pulmonary effort is normal. No respiratory distress.  Skin:    General: Skin is warm and dry.  Neurological:     General: No focal deficit present.     Mental Status: She is alert and oriented to person, place, and time. Mental status  is at baseline.     Cranial Nerves: No cranial nerve deficit.     Sensory: No sensory deficit.     Motor: No weakness.  Psychiatric:        Mood and Affect: Mood normal.        Behavior: Behavior normal.     ED Results / Procedures / Treatments   Labs (all labs ordered are listed, but only abnormal results are displayed) Labs Reviewed  COMPREHENSIVE METABOLIC PANEL - Abnormal; Notable for the following components:      Result Value   Glucose, Bld 112 (*)    All other components within normal limits  CBC WITH DIFFERENTIAL/PLATELET  TROPONIN I (HIGH SENSITIVITY)    EKG EKG Interpretation  Date/Time:  Sunday December 14 2022 08:56:09 EST Ventricular Rate:  55 PR Interval:  168 QRS Duration: 88 QT Interval:  432 QTC Calculation: 413 R Axis:   43 Text Interpretation: Sinus bradycardia with sinus arrhythmia Otherwise normal ECG When compared with ECG of 27-Sep-2022 17:02, PREVIOUS ECG IS PRESENT Confirmed by Glyn Ade [id number]) on 12/14/2022 9:11:10 AM  Radiology No results found.  Procedures Procedures    Medications Ordered in ED Medications - No data to display  ED Course/ Medical Decision Making/ A&P  Medical Decision Making  Patient is here with left-sided jaw pain and left facial paresthesia and tender lymphadenopathy.  She also has impacted external auditory canals.  The differential for her symptoms could include otitis media or middle ear effusion, versus symptomatic lymphadenopathy, versus trigeminal neuralgia.  Have a very low suspicion at this time that this is consistent with a TIA, stroke, intracranial lesion, or vascular insult, or coronary cause.  I personally reviewed and interpreted the patient's labs and EKG from arrival which are benign, normal troponin, no acute ischemic findings on EKG.  I do not see an indication for emergent imaging of the brain at this time.  The patient does not want to try NSAIDs because they are  upsetting to her stomach.  She does not want to try any medications as her symptoms are fairly minor, and she only came because she was anxious about a more serious medical condition.  I do recommend Debrox eardrops her ears and would have her call her PCP for follow-up on Monday.  She verbalized understanding.  Her husband was also present for the entire history and exam and to provide supplemental history.  She is stable for discharge at this time.        Final Clinical Impression(s) / ED Diagnoses Final diagnoses:  Bilateral impacted cerumen  Left facial pain    Rx / DC Orders ED Discharge Orders          Ordered    carbamide peroxide (DEBROX) 6.5 % OTIC solution  2 times daily        12/14/22 1157              Terald Sleeper, MD 12/14/22 1158

## 2022-12-14 NOTE — ED Provider Triage Note (Signed)
Emergency Medicine Provider Triage Evaluation Note  Nancy Vasquez , a 81 y.o. female  was evaluated in triage.  Pt complains of left jaw pain and numbness.  Patient states that she was feeding trail mix approximately around 9 PM last night when she developed left-sided jaw pain and numbness.  She states that since then, pain has significantly improved.  Pain is worsened with movement of her jaw and is relieved with rest.  Denies any dental pain, chest pain, shortness of breath, headache.  Review of Systems  Positive: See above Negative:   Physical Exam  BP 128/88   Pulse (!) 57   Temp 98.7 F (37.1 C) (Oral)   Resp 16   SpO2 99%  Gen:   Awake, no distress   Resp:  Normal effort  MSK:   Moves extremities without difficulty  Other:  Cranial nerves III through XII grossly intact.  Patient is tender to palpation of left submandibular region with associated overlying reported numbness.  No obvious external swelling, overlying skin changes.  No left TMJ tenderness.  Medical Decision Making  Medically screening exam initiated at 9:13 AM.  Appropriate orders placed.  [name] was informed that the remainder of the evaluation will be completed by another provider, this initial triage assessment does not replace that evaluation, and the importance of remaining in the ED until their evaluation is complete.     Peter Garter, [place] 12/14/22 0930

## 2022-12-14 NOTE — ED Triage Notes (Signed)
Patient from home w/ jaw pain and numbness to the left side that started last night at 9 p.m. Patient states she couldn't even eat her trail mix because it hurt so pain. Patient denies chest pain, shob.

## 2022-12-17 DIAGNOSIS — H6123 Impacted cerumen, bilateral: Secondary | ICD-10-CM | POA: Diagnosis not present

## 2023-01-01 DIAGNOSIS — R6884 Jaw pain: Secondary | ICD-10-CM | POA: Diagnosis not present

## 2023-01-01 NOTE — Progress Notes (Signed)
Formatting of this note is different from the original.  Subjective     HPI:  Alishba Naples is a 82 y.o.  female who presents to the office with:    Chief Complaint   Patient presents with    Follow-up     Patient reports her ears have been feeling a little more clear over the past 3 days. Patient states she ran out of the ear drops she was using and has felt the ear pain a few times previously after last office visit.    Gaps In Care     Zoster vaccine// patient declines  RSV Vaccine// patient declines      Presents to me today for follow-up on ear pain.  I saw her 2-3 weeks ago was here for cerumen impaction, and had ear lavage performed.  At this time she noticed drastic relief of her symptoms, I also advised continuing to use Debrox at home, which she has been using and says gives her relief.  She says the pain is largely better, but she has had pain every now and then the last week.  She describes this pain as starting at the ear and radiating along the jaw.  It is not worsened by chewing, but she also notices it in relation to a "lump" under her left jaw.  She tells me she is concerned about this, but says that Dr. Doreene Nest has been monitoring it for a long time.  She also complains of dry mouth/bitter taste in her mouth in the mornings. She denies any difficulty hearing, B symptoms, sharp pain, headaches, chest pain, shortness of breath.    PMH: Past medical history, Past surgical history, Social history, family history were reviewed as noted in EMR.  Pertinent for:   Past Medical History:   Diagnosis Date    Arthritis     COVID 05/2021    Gallstones     Heart murmur     Heart murmur     History of COVID-19 07/03/2021    Hyperlipidemia     Joint pain     Rheumatic fever      Medications and allergies reviewed.    ROS: See HPI    Objective     Vitals:    01/01/23 1234   BP: 106/72   Patient Position: Sitting   Pulse: 63   Temp: 98.7 F (37.1 C)   TempSrc: Temporal   Resp: 12   Height: 5\' 2"  (1.575 m)   Weight:  162 lb 12.8 oz (73.8 kg)   SpO2: 98%   BMI (Calculated): 29.8     Physical Exam  Constitutional:       General: She is not in acute distress.     Appearance: Normal appearance.   HENT:      Head: Normocephalic and atraumatic.      Right Ear: Tympanic membrane, ear canal and external ear normal.      Left Ear: Ear canal and external ear normal.      Ears:      Comments: Left tympanic membrane injected, but not bulging, no sign of perforation     Nose: No congestion or rhinorrhea.      Mouth/Throat:      Mouth: Mucous membranes are moist.      Pharynx: Oropharynx is clear. No oropharyngeal exudate or posterior oropharyngeal erythema.      Comments: No drainage/erythema within the mouth, no evident sialith bilaterally.   Eyes:      General:  Right eye: No discharge.         Left eye: No discharge.      Extraocular Movements: Extraocular movements intact.      Conjunctiva/sclera: Conjunctivae normal.   Cardiovascular:      Rate and Rhythm: Normal rate and regular rhythm.      Heart sounds: No murmur heard.     No friction rub. No gallop.   Musculoskeletal:         General: Normal range of motion.      Cervical back: Normal range of motion. No rigidity or tenderness.   Pulmonary:      Effort: No respiratory distress.      Breath sounds: Normal breath sounds. No wheezing, rhonchi or rales.   Lymphadenopathy:      Cervical: Cervical adenopathy (2 x 2 centimeter mobile lump under posterior left side of mandible.  Possible reactive lymph node) present.   Skin:     General: Skin is warm.      Coloration: Skin is not pale.      Findings: No erythema or rash.   Neurological:      General: No focal deficit present.      Mental Status: She is alert and oriented to person, place, and time.   Psychiatric:         Mood and Affect: Mood normal.         Behavior: Behavior normal.         Thought Content: Thought content normal.         Judgment: Judgment normal.     Assessment/Plan     1. Jaw pain (Primary)  Assessment &  Plan:    Possible TMJ.  She is here today because she is concerned about lymphadenopathy.  Small mobile lymph node palpated under left mandible at the junction with the neck.  However it is mobile and she says it frequently fluctuates in size.  I offered ultrasound if she is concerned enough to address it further.  However, she is not, and agrees to observe.  I recommended avoiding excessive chewing like gum and hard chewy foods.  Recommended TMJ exercises, and follow-up with dentist for new dentures.  Advised it was okay to use Tylenol for pain.  She verbalizes understanding, and tells me she will go get new dentures. Patient was counseled on red flag symptoms and verbalizes understanding. If these develop or current symptoms acutely worsen, will RTC.       -      Possible TMJ.  She is here today because she is concerned about lymphadenopathy.  Small mobile lymph node palpated under left mandible at the junction with the neck.  However it is mobile and she says it frequently fluctuates in size.  I offered ultrasound if she is concerned enough to address it further.  However, she is not, and agrees to observe.  I recommended avoiding excessive chewing like gum and hard chewy foods.  Recommended TMJ exercises, and follow-up with dentist for new dentures.  Advised it was okay to use Tylenol for pain.  She verbalizes understanding, and tells me she will go get new dentures. Patient was counseled on red flag symptoms and verbalizes understanding. If these develop or current symptoms acutely worsen, will RTC.    Medicare AWV done December 2023.  She will follow-up at the end of this year.  No follow-ups on file.     No future appointments.    Risks, benefits, and alternatives of the medications and  treatment plan prescribed today were discussed, and patient expressed understanding. Plan follow-up as discussed or as needed if any worsening symptoms or change in condition.    A yearly preventative health exam was  recommended and current age based recommendations were discussed.        Medications at end of visit today:    Current Outpatient Medications:     acetaminophen (TYLENOL ARTHRITIS,MAPAP) 650 MG CR tablet, Take two tablets (1,300 mg dose) by mouth., Disp: , Rfl:     atorvastatin (LIPITOR) 40 mg tablet, TAKE 1 TABLET(40 MG) BY MOUTH DAILY, Disp: 90 tablet, Rfl: 1    carbamide peroxide (AURO,DEBROX) 6.5% otic solution, Place five drops in ear(s)., Disp: , Rfl:     clopidogrel bisulfate (PLAVIX) 75 mg tablet, TAKE 1 TABLET(75 MG) BY MOUTH DAILY, Disp: 90 tablet, Rfl: 1    ergocalciferol (VITAMIN D2) 50,000 units CAPS capsule, Take one capsule (50,000 Units dose) by mouth once a week at 0900., Disp: , Rfl:     nitroGLYCERIN (NITROSTAT) 0.4 mg SL tablet, USE UTD, Disp: , Rfl: 3    Patient Care Team:  Macy Mis, MD as PCP - General (Family Medicine)  Electronically signed by Tiana Loft, PA-C at 01/01/2023  2:23 PM EST

## 2023-01-01 NOTE — Assessment & Plan Note (Signed)
Associated Problem(s): Jaw pain  Formatting of this note might be different from the original.    Possible TMJ.  She is here today because she is concerned about lymphadenopathy.  Small mobile lymph node palpated under left mandible at the junction with the neck.  However it is mobile and she says it frequently fluctuates in size.  I offered ultrasound if she is concerned enough to address it further.  However, she is not, and agrees to observe.  I recommended avoiding excessive chewing like gum and hard chewy foods.  Recommended TMJ exercises, and follow-up with dentist for new dentures.  Advised it was okay to use Tylenol for pain.  She verbalizes understanding, and tells me she will go get new dentures. Patient was counseled on red flag symptoms and verbalizes understanding. If these develop or current symptoms acutely worsen, will RTC.  Electronically signed by Herschell Dimes, PA-C at 01/01/2023  2:23 PM EST

## 2023-02-02 ENCOUNTER — Emergency Department: Admit: 2023-02-02 | Payer: MEDICARE

## 2023-02-02 ENCOUNTER — Inpatient Hospital Stay
Admit: 2023-02-02 | Discharge: 2023-02-02 | Disposition: A | Discharge status: Home / Self Care | Payer: MEDICARE | Attending: Emergency Medicine

## 2023-02-02 DIAGNOSIS — R519 Headache, unspecified: Secondary | ICD-10-CM | POA: Diagnosis not present

## 2023-02-02 DIAGNOSIS — Z8673 Personal history of transient ischemic attack (TIA), and cerebral infarction without residual deficits: Secondary | ICD-10-CM | POA: Diagnosis not present

## 2023-02-02 LAB — CBC WITH AUTO DIFFERENTIAL
Absolute Immature Granulocyte: 0 10*3/uL (ref 0.00–0.04)
Basophils %: 0 % (ref 0–1)
Basophils Absolute: 0 10*3/uL (ref 0.0–0.1)
Eosinophils %: 1 % (ref 0–7)
Eosinophils Absolute: 0.1 10*3/uL (ref 0.0–0.4)
Hematocrit: 36.3 % (ref 35.0–47.0)
Hemoglobin: 12 g/dL (ref 11.5–16.0)
Immature Granulocytes: 0 % (ref 0–0.5)
Lymphocytes %: 40 % (ref 12–49)
Lymphocytes Absolute: 2.2 10*3/uL (ref 0.8–3.5)
MCH: 30.7 PG (ref 26.0–34.0)
MCHC: 33.1 g/dL (ref 30.0–36.5)
MCV: 92.8 FL (ref 80.0–99.0)
MPV: 9.8 FL (ref 8.9–12.9)
Monocytes %: 9 % (ref 5–13)
Monocytes Absolute: 0.5 10*3/uL (ref 0.0–1.0)
Neutrophils %: 50 % (ref 32–75)
Neutrophils Absolute: 2.7 10*3/uL (ref 1.8–8.0)
Nucleated RBCs: 0 PER 100 WBC
Platelets: 247 10*3/uL (ref 150–400)
RBC: 3.91 M/uL (ref 3.80–5.20)
RDW: 12.2 % (ref 11.5–14.5)
WBC: 5.5 10*3/uL (ref 3.6–11.0)
nRBC: 0 10*3/uL (ref 0.00–0.01)

## 2023-02-02 LAB — BASIC METABOLIC PANEL
Anion Gap: 10 mmol/L (ref 5–15)
BUN: 11 MG/DL (ref 6–20)
Bun/Cre Ratio: 15 (ref 12–20)
CO2: 25 mmol/L (ref 21–32)
Calcium: 9.1 MG/DL (ref 8.5–10.1)
Chloride: 109 mmol/L — ABNORMAL HIGH (ref 97–108)
Creatinine: 0.71 MG/DL (ref 0.55–1.02)
Est, Glom Filt Rate: >60 mL/min/{1.73_m2} (ref >60)
Glucose: 86 mg/dL (ref 65–100)
Potassium: 4 mmol/L (ref 3.5–5.1)
Sodium: 144 mmol/L (ref 136–145)

## 2023-02-02 MED ORDER — SODIUM CHLORIDE 0.9 % IV BOLUS
0.9 | Freq: Once | INTRAVENOUS | Status: AC
Start: 2023-02-02 — End: 2023-02-02
  Administered 2023-02-02: 21:00:00 500 mL via INTRAVENOUS

## 2023-02-02 MED FILL — SODIUM CHLORIDE 0.9 % IV SOLN: 0.9 % | INTRAVENOUS | Qty: 500

## 2023-02-02 NOTE — ED Triage Notes (Signed)
Pt ambulates to treatment area with steady gate she states that 2 weeks ago she traveled here to visit her daughter from [place] when she arrived she experienced pain on the top of her head on the right.  So the first day she was here she took Tylenol and rested and it seemed to help.  The first week she was here the head pain was intermittent but over the past 4-5 days the pain has been stronger and more consistent.  When she takes the Tylenol it helps but the pain does come back.  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  She has not had an issue with her eye but does have a follow up cateract appointment this week in [place].  Denies any dizziness

## 2023-02-02 NOTE — ED Provider Notes (Signed)
[facility] EMERGENCY DEPT  EMERGENCY DEPARTMENT ENCOUNTER      Pt Name: Kristi Orozco  MRN: [id number]  [place] [birth year]  Date of evaluation: 02/02/2023  Provider: Lutricia Horsfall, MD    CHIEF COMPLAINT       Chief Complaint   Patient presents with    Headache         HISTORY OF PRESENT ILLNESS   (Location/Symptom, Timing/Onset, Context/Setting, Quality, Duration, Modifying Factors, Severity)  Note limiting factors.   Patient is a 82 year old who comes into the emergency department where headache been intermittent for the last 2 weeks but has become more frequent and more intense over the last 4 to 5 days.  She has a history of TIA and was concerned that the headache was related to a stroke so she came into the emergency department because she is preparing to return to [place] and did not want to get on the road without being evaluated.  The headache improves with Tylenol but returns after the medication wears off.  She has not had associated numbness, tingling, weakness, vomiting.  She has not had any recent fevers or chills.  She has not had any sick contacts.    The history is provided by the patient.         Review of External Medical Records:     Nursing Notes were reviewed.    REVIEW OF SYSTEMS    (2-9 systems for level 4, 10 or more for level 5)     Review of Systems    Except as noted above the remainder of the review of systems was reviewed and negative.       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[place])     Hyperlipidemia          SURGICAL HISTORY       Past Surgical History:   Procedure Laterality Date    CHOLECYSTECTOMY      HYSTERECTOMY (CERVIX STATUS UNKNOWN)      JOINT REPLACEMENT Left     ROTATOR CUFF REPAIR           CURRENT MEDICATIONS       Discharge Medication List as of 02/02/2023  5:34 PM        CONTINUE these medications which have NOT CHANGED    Details   acetaminophen (TYLENOL) 650 MG extended release tablet  Take 2 tablets by mouthHistorical Med      atorvastatin (LIPITOR) 40 MG tablet Take 1 tablet by mouth dailyHistorical Med      clopidogrel (PLAVIX) 75 MG tablet Take 1 tablet by mouth dailyHistorical Med      vitamin D (ERGOCALCIFEROL) 1.25 MG (50000 UT) CAPS capsule Take 1 capsule by mouth once a weekHistorical Med      carbamide peroxide (DEBROX) 6.5 % otic solution Place 5 drops in ear(s) as neededHistorical Med             ALLERGIES     Ibuprofen and Oxycodone    FAMILY HISTORY     History reviewed. No pertinent family history.       SOCIAL HISTORY       Social History     Socioeconomic History    Marital status: Married     Spouse name: None    Number of children: None    Years of education: None    Highest education level: None   Tobacco Use  Smoking status: Never     Passive exposure: Never    Smokeless tobacco: Never   Substance and Sexual Activity    Alcohol use: Never    Drug use: Never           PHYSICAL EXAM    (up to 7 for level 4, 8 or more for level 5)     ED Triage Vitals [02/02/23 1542]   BP Temp Temp Source Pulse Respirations SpO2 Height Weight - Scale   (!) 150/70 97.4 F (36.3 C) Oral 63 16 99 % 1.575 m (5' 2"$ ) 72.6 kg (160 lb)       Body mass index is 29.26 kg/m.    Physical Exam  Constitutional:       Appearance: Normal appearance. She is not ill-appearing.   HENT:      Head: Normocephalic and atraumatic.      Mouth/Throat:      Mouth: Mucous membranes are moist.   Eyes:      Conjunctiva/sclera: Conjunctivae normal.   Cardiovascular:      Rate and Rhythm: Normal rate and regular rhythm.   Pulmonary:      Effort: Pulmonary effort is normal.      Breath sounds: Normal breath sounds.   Abdominal:      General: There is no distension.      Palpations: Abdomen is soft.      Tenderness: There is no abdominal tenderness.   Skin:     General: Skin is warm and dry.   Neurological:      General: No focal deficit present.      Mental Status: She is alert and oriented to person, place, and time.    Psychiatric:         Mood and Affect: Mood normal.         Behavior: Behavior normal.         Thought Content: Thought content normal.         Judgment: Judgment normal.         DIAGNOSTIC RESULTS     EKG: All EKG's are interpreted by the Emergency Department Physician who either signs or Co-signs this chart in the absence of a cardiologist.        RADIOLOGY:   Non-plain film images such as CT, Ultrasound and MRI are read by the radiologist. Plain radiographic images are visualized and preliminarily interpreted by the emergency physician with the below findings:        Interpretation per the Radiologist below, if available at the time of this note:    CT Head W/O Contrast   Final Result   1. No evidence of acute infarct or intracranial hemorrhage.   2. Mild periventricular white matter disease is likely secondary to chronic   small vessel ischemic changes.               LABS:  Labs Reviewed   BASIC METABOLIC PANEL - Abnormal; Notable for the following components:       Result Value    Chloride 109 (*)     All other components within normal limits   CBC WITH AUTO DIFFERENTIAL       All other labs were within normal range or not returned as of this dictation.    EMERGENCY DEPARTMENT COURSE and DIFFERENTIAL DIAGNOSIS/MDM:   Vitals:    Vitals:    02/02/23 1542 02/02/23 1549 02/02/23 1604 02/02/23 1619   BP: (!) 150/70 127/88 128/68 137/78   Pulse: 63  Resp: 16      Temp: 97.4 F (36.3 C)      TempSrc: Oral      SpO2: 99% 99% 100% 97%   Weight: 72.6 kg (160 lb)      Height: 1.575 m (5' 2"$ )              Medical Decision Making  Amount and/or Complexity of Data Reviewed  Labs: ordered.  Radiology: ordered.    Risk  Prescription drug management.      The patient presented to the emergency department with a headache. The patient is now resting comfortably and feels better, is alert, talkative, interactive and in no distress. The patient appears well and is able to tolerate PO fluids. The repeat examination is  unremarkable and benign. The patient is neurologically intact, has a normal mental status, and is ambulatory in the ED. The history, exam, diagnostic testing (if any) and the patient's current condition do not suggest meningitis, stroke, sepsis, subarachnoid hemorrhage, intracranial bleeding, encephalitis, temporal arteritis or other significant pathology to warrant further testing, continued ED treatment, admission, neurological consultation, or other specialist evaluation at this point. The vital signs have been stable. The patient's condition is stable and appropriate for discharge. The patient will pursue further outpatient evaluation with the primary care physician or other designated or consulting physician as indicated in the discharge instructions.        REASSESSMENT            CONSULTS:  None    PROCEDURES:  Unless otherwise noted below, none     Procedures      FINAL IMPRESSION      1. Recurrent headache          DISPOSITION/PLAN   DISPOSITION Decision To Discharge 02/02/2023 05:07:40 PM      PATIENT REFERRED TO:  Katherina Mires, MD  [place] [address]  [phone]    Schedule an appointment as soon as possible for a visit         DISCHARGE MEDICATIONS:  Discharge Medication List as of 02/02/2023  5:34 PM            (Please note that portions of this note were completed with a voice recognition program.  Efforts were made to edit the dictations but occasionally words are mis-transcribed.)    Lutricia Horsfall, MD (electronically signed)  Emergency Attending Physician / Physician Assistant / Nurse Practitioner              Audrea Muscat, MD  02/11/23 [phone]

## 2023-02-04 DIAGNOSIS — H35351 Cystoid macular degeneration, right eye: Secondary | ICD-10-CM | POA: Diagnosis not present

## 2023-02-05 DIAGNOSIS — I10 Essential (primary) hypertension: Secondary | ICD-10-CM | POA: Diagnosis not present

## 2023-02-05 DIAGNOSIS — E785 Hyperlipidemia, unspecified: Secondary | ICD-10-CM | POA: Diagnosis not present

## 2023-02-05 DIAGNOSIS — I25118 Atherosclerotic heart disease of native coronary artery with other forms of angina pectoris: Secondary | ICD-10-CM | POA: Diagnosis not present

## 2023-02-05 DIAGNOSIS — E559 Vitamin D deficiency, unspecified: Secondary | ICD-10-CM | POA: Diagnosis not present

## 2023-02-13 DIAGNOSIS — G459 Transient cerebral ischemic attack, unspecified: Secondary | ICD-10-CM | POA: Diagnosis not present

## 2023-02-13 DIAGNOSIS — M25562 Pain in left knee: Secondary | ICD-10-CM | POA: Diagnosis not present

## 2023-02-13 DIAGNOSIS — R519 Headache, unspecified: Secondary | ICD-10-CM | POA: Diagnosis not present

## 2023-02-13 DIAGNOSIS — M25561 Pain in right knee: Secondary | ICD-10-CM | POA: Diagnosis not present

## 2023-03-05 DIAGNOSIS — I2510 Atherosclerotic heart disease of native coronary artery without angina pectoris: Secondary | ICD-10-CM | POA: Diagnosis not present

## 2023-03-05 DIAGNOSIS — R0789 Other chest pain: Secondary | ICD-10-CM | POA: Diagnosis not present

## 2023-03-05 DIAGNOSIS — H9202 Otalgia, left ear: Secondary | ICD-10-CM | POA: Diagnosis not present

## 2023-03-16 DIAGNOSIS — G8929 Other chronic pain: Secondary | ICD-10-CM | POA: Diagnosis not present

## 2023-03-16 DIAGNOSIS — M25562 Pain in left knee: Secondary | ICD-10-CM | POA: Diagnosis not present

## 2023-03-16 DIAGNOSIS — M545 Low back pain, unspecified: Secondary | ICD-10-CM | POA: Diagnosis not present

## 2023-03-16 DIAGNOSIS — M25561 Pain in right knee: Secondary | ICD-10-CM | POA: Diagnosis not present

## 2023-03-17 ENCOUNTER — Encounter: Payer: Self-pay | Admitting: Neurology

## 2023-03-17 ENCOUNTER — Telehealth: Payer: Self-pay | Admitting: Neurology

## 2023-03-17 ENCOUNTER — Ambulatory Visit: Payer: Medicare Other | Admitting: Neurology

## 2023-03-17 VITALS — BP 118/70 | HR 58 | Ht 65.0 in | Wt 161.4 lb

## 2023-03-17 DIAGNOSIS — E785 Hyperlipidemia, unspecified: Secondary | ICD-10-CM

## 2023-03-17 DIAGNOSIS — R4701 Aphasia: Secondary | ICD-10-CM | POA: Diagnosis not present

## 2023-03-17 DIAGNOSIS — G459 Transient cerebral ischemic attack, unspecified: Secondary | ICD-10-CM | POA: Insufficient documentation

## 2023-03-17 NOTE — Telephone Encounter (Signed)
Hilda Blades, can you see if you can get the records for cardiac monitor from Dr. Terrence Dupont? Thanks

## 2023-03-17 NOTE — Progress Notes (Signed)
Patient: Nancy Vasquez Date of Birth: 12-15-1941  Reason for Visit: Follow up for TIA History from: Patient Primary Neurologist: Dr. Leonie Man (Saw Dr. Erlinda Hong)  ASSESSMENT AND PLAN 82 y.o. year old female   29.  TIA, left branch retinal artery occlusion and 2 episodes of aphasia -Concerning for cardioembolic source, such as paroxysmal A-fib -Continue Plavix 75 mg daily -Reports cardiac monitoring was completed through Dr. Terrence Dupont, I will try to get the results  2.  Hypertension -BP goal less than 130/90 -At goal  3.  Hyperlipidemia -LDL 76, goal less than 70 -On Lipitor 40 mg daily, labs are done through PCP and cardiology, recent lipid panel not available for review  Overall she has done well.  She has no new issues or concerns to discuss.  She will continue close follow-up with PCP and cardiology.  I will try to get the results from the cardiac monitor study.  She will follow-up at our office on an as-needed basis.  HISTORY OF PRESENT ILLNESS: Today 03/17/23 Doing well today, remains on Plavix. Reports wore heart monitor for 1 month was told everything was fine last saw Dr. Terrence Dupont. Main trouble is left knee pain. Is going to see orthopedics, has had left knee replacement in the past. Had to go to the ER 02/02/23 for headache, right mid head, it went away. CT scan was fine. She thinks was overdoing it. She is a busy person, helps with her daughter, her husband has MM. Still on Lipitor, PCP/cardiology follows labs.  No new issues or concerns.  She looks good today.  Initial stroke follow up visit 11/06/22 Nancy Vasquez: Nancy Vasquez is here today for TIA follow-up. On 09/27/22 had episodes of left visual field cut to her left eye for a few minutes then resolved, followed by 2 episodes of expressive aphasia for 30 minutes, then resolving. She had no lingering deficits. Was on aspirin 81 mg at the time. Is now on just Plavix. Taking increased Lipitor 40 mg daily. Has seen Dr. Terrence Dupont, no mention of  cardiac monitoring. Dr. Erlinda Hong had recommended long time cardiac monitoring. 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 Her husband has MM, but is doing well. She has no problems or concerns. Is using cane, no falls. She drives. Has cataract to left eye, uses eye drops. BP slightly up today, but checks at home, usually runs in 115's. She feels she has noticed less creasing to the left mouth since the TIA.  -CT head showed no acute abnormality -CTA head and neck no LVO, mild stenosis in bilateral cavernous and right supraclinoid ICA -MRI of the brain no acute abnormality -2D echo EF 55 to [id number], grade 2 diastolic dysfunction -LDL 76 -A1c 4.7 -Aspirin 81 mg daily alone prior to admission, after admission aspirin 81 mg daily and Plavix 75 mg daily for 3 weeks followed by Plavix alone  HISTORY  09/27/22 Dr. Leonel Ramsay HPI: Nancy Vasquez is a 82 y.o. female with a history of hypercholesterolemia who presents with an episode of transient visual change followed by aphasia.  She states that the first thing that she noticed was that she was having trouble seeing out of the left side of her left eye.  She does report that she tried closing each eye sequentially and indeed it seems to be only affecting the left eye.  This improved over a 1/2-hour but then subsequently little while after it improved, she began having difficulty speaking.  Husband reports that  it was formed words, but the words did not make sense in the context.  She states that she knew what she wanted to say but wrong words kept coming out.  This lasted for about 15 minutes.   She has never had anything like this before.  And due to the symptoms she came in for further evaluation.   LKW: [id number] tpa given?: no, resolution of symptoms  REVIEW OF SYSTEMS: Out of a complete 14 system review of symptoms, the patient complains only of the following symptoms, and all other reviewed systems are negative.  See  HPI  ALLERGIES: Allergies  Allergen Reactions   Ibuprofen Nausea And Vomiting and Other (See Comments)    "burns stomach"   Oxycodone Nausea Only    "Burns stomach"    HOME MEDICATIONS: Outpatient Medications Prior to Visit  Medication Sig Dispense Refill   acetaminophen (TYLENOL) 650 MG CR tablet Take 650 mg by mouth as needed for pain.     atorvastatin (LIPITOR) 40 MG tablet Take 1 tablet by mouth daily.     clopidogrel (PLAVIX) 75 MG tablet Take 75 mg by mouth daily.     Vitamin D, Ergocalciferol, (DRISDOL) 1.25 MG (50000 UNIT) CAPS capsule Take 50,000 Units by mouth once a week.     atorvastatin (LIPITOR) 40 MG tablet Take 1 tablet (40 mg total) by mouth daily. 30 tablet 0   carbamide peroxide (DEBROX) 6.5 % OTIC solution Place 5 drops into both ears 2 (two) times daily. (Patient not taking: Reported on 03/17/2023) 15 mL 0   No facility-administered medications prior to visit.    PAST MEDICAL HISTORY: Past Medical History:  Diagnosis Date   Arthritis    has had right hip replacement   Coronary artery disease 09/27/2022    PAST SURGICAL HISTORY: Past Surgical History:  Procedure Laterality Date   ABDOMINAL HYSTERECTOMY     BREAST EXCISIONAL BIOPSY Left    INTRAVASCULAR PRESSURE WIRE/FFR STUDY N/A 03/17/2017   Procedure: Intravascular Pressure Wire/FFR Study;  Surgeon: Charolette Forward, MD;  Location: [facility];  Service: Cardiovascular;  Laterality: N/A;  mid LAD   LEFT HEART CATH AND CORONARY ANGIOGRAPHY N/A 03/17/2017   Procedure: Left Heart Cath and Coronary Angiography;  Surgeon: Charolette Forward, MD;  Location: [facility];  Service: Cardiovascular;  Laterality: N/A;   TONSILLECTOMY     Removed as a child   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   Procedure: LEFT TOTAL KNEE ARTHROPLASTY;  Surgeon: Meredith Pel, MD;  Location: [facility];  Service: Orthopedics;  Laterality: Left;    FAMILY HISTORY: Family History  Problem Relation Age of Onset   Breast  cancer Sister 32       metastatic breast ca    SOCIAL HISTORY: Social History   Socioeconomic History   Marital status: Married    Spouse name: Not on file   Number of children: Not on file   Years of education: Not on file   Highest education level: Not on file  Occupational History   Not on file  Tobacco Use   Smoking status: Never   Smokeless tobacco: Never  Substance and Sexual Activity   Alcohol use: Never   Drug use: Never   Sexual activity: Not on file  Other Topics Concern   Not on file  Social History Narrative   Not on file   Social Determinants of Health   Financial Resource Strain: Not on file  Food Insecurity: Not on file  Transportation Needs: Not on file  Physical Activity: Not on file  Stress: Not on file  Social Connections: Not on file  Intimate Partner Violence: Not on file   PHYSICAL EXAM  Vitals:   03/17/23 1015  BP: 118/70  Pulse: (!) 58  Weight: 161 lb 6.4 oz (73.2 kg)  Height: 5\' 5"  (1.651 m)    Body mass index is 26.86 kg/m.  Generalized: Well developed, in no acute distress  Neurological examination  Mentation: Alert oriented to time, place, history taking. Follows all commands speech and language fluent Cranial nerve II-XII: Pupils were equal round reactive to light. Extraocular movements were full, visual field were full on confrontational test. Facial sensation and strength were normal. Head turning and shoulder shrug  were normal and symmetric.   Motor: Good strength throughout, exception 4/5 left knee flexion extension Sensory: Sensory testing is intact to soft touch on all 4 extremities. No evidence of extinction is noted.  Coordination: Cerebellar testing reveals good finger-nose-finger and heel-to-shin bilaterally.  Gait and station: Limp on the left, uses cane in the hallway   DIAGNOSTIC DATA (LABS, IMAGING, TESTING) - I reviewed patient records, labs, notes, testing and imaging myself where available.  Lab Results   Component Value Date   WBC 4.9 12/14/2022   HGB 12.0 12/14/2022   HCT 38.1 12/14/2022   MCV 95.5 12/14/2022   PLT 269 12/14/2022      Component Value Date/Time   NA 140 12/14/2022 0924   K 4.9 12/14/2022 0924   CL 108 12/14/2022 0924   CO2 25 12/14/2022 0924   GLUCOSE 112 (H) 12/14/2022 0924   BUN 8 12/14/2022 0924   CREATININE 0.74 12/14/2022 0924   CALCIUM 9.2 12/14/2022 0924   PROT 6.7 12/14/2022 0924   ALBUMIN 3.6 12/14/2022 0924   AST 22 12/14/2022 0924   ALT 16 12/14/2022 0924   ALKPHOS 48 12/14/2022 0924   BILITOT 1.2 12/14/2022 0924   GFRNONAA >60 12/14/2022 0924   GFRAA >60 04/23/2020 1941   Lab Results  Component Value Date   CHOL 152 09/28/2022   HDL 68 09/28/2022   LDLCALC 76 09/28/2022   TRIG 40 09/28/2022   CHOLHDL 2.2 09/28/2022   Lab Results  Component Value Date   HGBA1C 4.7 (L) 09/28/2022   No results found for: "VITAMINB12" No results found for: "TSH"  Butler Denmark, AGNP-C, DNP 03/17/2023, 10:54 AM [facility] [address] [place], [place] [address] [phone]

## 2023-03-17 NOTE — Patient Instructions (Signed)
Stay on Plavix  Keep BP < 130/90, LDL < 70, A1C < 7.0 Make sure pcp follows labs I will try to get cardiac monitor results from cardiology  Return here as needed

## 2023-03-24 DIAGNOSIS — H524 Presbyopia: Secondary | ICD-10-CM | POA: Diagnosis not present

## 2023-04-07 DIAGNOSIS — L0390 Cellulitis, unspecified: Secondary | ICD-10-CM | POA: Diagnosis not present

## 2023-04-07 DIAGNOSIS — H9209 Otalgia, unspecified ear: Secondary | ICD-10-CM | POA: Diagnosis not present

## 2023-04-08 ENCOUNTER — Encounter (HOSPITAL_COMMUNITY): Payer: Self-pay | Admitting: Emergency Medicine

## 2023-04-08 ENCOUNTER — Observation Stay (HOSPITAL_COMMUNITY)
Admission: EM | Admit: 2023-04-08 | Discharge: 2023-04-10 | Disposition: A | Discharge status: Home / Self Care | Payer: Medicare Other | Attending: Emergency Medicine | Admitting: Emergency Medicine

## 2023-04-08 DIAGNOSIS — N764 Abscess of vulva: Secondary | ICD-10-CM | POA: Diagnosis not present

## 2023-04-08 DIAGNOSIS — L02219 Cutaneous abscess of trunk, unspecified: Secondary | ICD-10-CM

## 2023-04-08 DIAGNOSIS — L02214 Cutaneous abscess of groin: Secondary | ICD-10-CM | POA: Diagnosis not present

## 2023-04-08 DIAGNOSIS — K5730 Diverticulosis of large intestine without perforation or abscess without bleeding: Secondary | ICD-10-CM | POA: Diagnosis not present

## 2023-04-08 DIAGNOSIS — N762 Acute vulvitis: Principal | ICD-10-CM | POA: Insufficient documentation

## 2023-04-08 DIAGNOSIS — I5030 Unspecified diastolic (congestive) heart failure: Secondary | ICD-10-CM | POA: Diagnosis not present

## 2023-04-08 DIAGNOSIS — I2510 Atherosclerotic heart disease of native coronary artery without angina pectoris: Secondary | ICD-10-CM | POA: Insufficient documentation

## 2023-04-08 DIAGNOSIS — Z8673 Personal history of transient ischemic attack (TIA), and cerebral infarction without residual deficits: Secondary | ICD-10-CM | POA: Insufficient documentation

## 2023-04-08 DIAGNOSIS — Z7902 Long term (current) use of antithrombotics/antiplatelets: Secondary | ICD-10-CM | POA: Diagnosis not present

## 2023-04-08 DIAGNOSIS — L739 Follicular disorder, unspecified: Secondary | ICD-10-CM | POA: Diagnosis not present

## 2023-04-08 DIAGNOSIS — L0390 Cellulitis, unspecified: Secondary | ICD-10-CM

## 2023-04-08 DIAGNOSIS — Z79899 Other long term (current) drug therapy: Secondary | ICD-10-CM | POA: Insufficient documentation

## 2023-04-08 DIAGNOSIS — Z96659 Presence of unspecified artificial knee joint: Secondary | ICD-10-CM | POA: Diagnosis not present

## 2023-04-08 DIAGNOSIS — L03314 Cellulitis of groin: Secondary | ICD-10-CM | POA: Diagnosis not present

## 2023-04-08 DIAGNOSIS — R1031 Right lower quadrant pain: Secondary | ICD-10-CM | POA: Diagnosis not present

## 2023-04-08 NOTE — ED Provider Triage Note (Signed)
Emergency Medicine Provider Triage Evaluation Note  Nancy Vasquez , a 82 y.o. female  was evaluated in triage.  Pt complains of Has a nodule in the right groin, seen by her primary care doctor recently given doxycycline but still having pain and redness.  Review of Systems  Positive: R groin "nodule", nausea Negative: Fever, chills, drainage  Physical Exam  BP 120/78 (BP Location: Left Arm)   Pulse 79   Temp 98.5 F (36.9 C) (Oral)   Resp 15   SpO2 96%  Gen:   Awake, no distress   Resp:  Normal effort  MSK:   Moves extremities without difficulty  Other:    Medical Decision Making  Medically screening exam initiated at 11:57 PM.  Appropriate orders placed.  Nancy Vasquez was informed that the remainder of the evaluation will be completed by another provider, this initial triage assessment does not replace that evaluation, and the importance of remaining in the ED until their evaluation is complete.     [name], [place] 04/08/23 2358

## 2023-04-08 NOTE — ED Triage Notes (Signed)
Pt has nodule on R groin- nondraining. Been taking doxy for it. Pain is not improving.

## 2023-04-09 ENCOUNTER — Emergency Department (HOSPITAL_COMMUNITY): Payer: Medicare Other

## 2023-04-09 ENCOUNTER — Other Ambulatory Visit: Payer: Self-pay

## 2023-04-09 DIAGNOSIS — K5730 Diverticulosis of large intestine without perforation or abscess without bleeding: Secondary | ICD-10-CM | POA: Diagnosis not present

## 2023-04-09 DIAGNOSIS — L0390 Cellulitis, unspecified: Secondary | ICD-10-CM | POA: Diagnosis present

## 2023-04-09 LAB — BASIC METABOLIC PANEL
Anion gap: 12 (ref 5–15)
BUN: 8 mg/dL (ref 8–23)
CO2: 22 mmol/L (ref 22–32)
Calcium: 9 mg/dL (ref 8.9–10.3)
Chloride: 105 mmol/L (ref 98–111)
Creatinine, Ser: 0.72 mg/dL (ref 0.44–1.00)
GFR, Estimated: >60 mL/min (ref >60)
Glucose, Bld: 115 mg/dL — ABNORMAL HIGH (ref 70–99)
Potassium: 3.8 mmol/L (ref 3.5–5.1)
Sodium: 139 mmol/L (ref 135–145)

## 2023-04-09 LAB — CBC WITH DIFFERENTIAL/PLATELET
Abs Immature Granulocytes: 0.02 10*3/uL (ref 0.00–0.07)
Basophils Absolute: 0 10*3/uL (ref 0.0–0.1)
Basophils Relative: 0 %
Eosinophils Absolute: 0 10*3/uL (ref 0.0–0.5)
Eosinophils Relative: 0 %
HCT: 37 % (ref 36.0–46.0)
Hemoglobin: 12.1 g/dL (ref 12.0–15.0)
Immature Granulocytes: 0 %
Lymphocytes Relative: 20 %
Lymphs Abs: 2.1 10*3/uL (ref 0.7–4.0)
MCH: 30.6 pg (ref 26.0–34.0)
MCHC: 32.7 g/dL (ref 30.0–36.0)
MCV: 93.4 fL (ref 80.0–100.0)
Monocytes Absolute: 1.2 10*3/uL — ABNORMAL HIGH (ref 0.1–1.0)
Monocytes Relative: 11 %
Neutro Abs: 7 10*3/uL (ref 1.7–7.7)
Neutrophils Relative %: 69 %
Platelets: 261 10*3/uL (ref 150–400)
RBC: 3.96 MIL/uL (ref 3.87–5.11)
RDW: 11.8 % (ref 11.5–15.5)
WBC: 10.3 10*3/uL (ref 4.0–10.5)
nRBC: 0 % (ref 0.0–0.2)

## 2023-04-09 MED ORDER — SODIUM CHLORIDE 0.9 % IV BOLUS
500.0000 mL | Freq: Once | INTRAVENOUS | Status: AC
  Administered 2023-04-09: 500 mL via INTRAVENOUS

## 2023-04-09 MED ORDER — VANCOMYCIN HCL 1500 MG/300ML IV SOLN
1500.0000 mg | Freq: Once | INTRAVENOUS | Status: AC
  Administered 2023-04-09: 1500 mg via INTRAVENOUS
  Filled 2023-04-09 (×2): qty 300

## 2023-04-09 MED ORDER — SODIUM CHLORIDE 0.9 % IV SOLN
1.0000 g | Freq: Once | INTRAVENOUS | Status: AC
  Filled 2023-04-09: qty 10

## 2023-04-09 MED ORDER — ACETAMINOPHEN 500 MG PO TABS
1000.0000 mg | ORAL_TABLET | Freq: Four times a day (QID) | ORAL | Status: DC | PRN
  Administered 2023-04-09 (×2): 1000 mg via ORAL
  Filled 2023-04-09 (×2): qty 2

## 2023-04-09 MED ORDER — ATORVASTATIN CALCIUM 40 MG PO TABS
40.0000 mg | ORAL_TABLET | Freq: Every day | ORAL | Status: DC
  Administered 2023-04-09 – 2023-04-10 (×2): 40 mg via ORAL
  Filled 2023-04-09 (×2): qty 1

## 2023-04-09 MED ORDER — ACETAMINOPHEN 325 MG PO TABS
650.0000 mg | ORAL_TABLET | Freq: Four times a day (QID) | ORAL | Status: DC | PRN
Start: 2023-04-09 — End: 2023-04-09

## 2023-04-09 MED ORDER — RIVAROXABAN 10 MG PO TABS
10.0000 mg | ORAL_TABLET | Freq: Every day | ORAL | Status: DC
  Administered 2023-04-09: 10 mg via ORAL
  Filled 2023-04-09: qty 1

## 2023-04-09 MED ORDER — PIPERACILLIN-TAZOBACTAM 3.375 G IVPB
3.3750 g | Freq: Three times a day (TID) | INTRAVENOUS | Status: DC
  Administered 2023-04-09 – 2023-04-10 (×3): 3.375 g via INTRAVENOUS
  Filled 2023-04-09 (×3): qty 50

## 2023-04-09 MED ORDER — ACETAMINOPHEN 650 MG RE SUPP
650.0000 mg | Freq: Four times a day (QID) | RECTAL | Status: DC | PRN
Start: 2023-04-09 — End: 2023-04-09

## 2023-04-09 MED ORDER — CLOPIDOGREL BISULFATE 75 MG PO TABS
75.0000 mg | ORAL_TABLET | Freq: Every day | ORAL | Status: DC
  Administered 2023-04-09 – 2023-04-10 (×2): 75 mg via ORAL
  Filled 2023-04-09 (×2): qty 1

## 2023-04-09 MED ORDER — ACETAMINOPHEN 500 MG PO TABS
1000.0000 mg | ORAL_TABLET | ORAL | Status: AC
  Administered 2023-04-09: 1000 mg via ORAL
  Filled 2023-04-09: qty 2

## 2023-04-09 MED ORDER — VANCOMYCIN HCL 750 MG/150ML IV SOLN: 750.0000 mg | INTRAVENOUS | Status: DC

## 2023-04-09 MED ORDER — IOHEXOL 350 MG/ML SOLN
75.0000 mL | Freq: Once | INTRAVENOUS | Status: AC | PRN
  Administered 2023-04-09: 75 mL via INTRAVENOUS

## 2023-04-09 NOTE — H&P (Addendum)
Date: 04/09/2023               Patient Name:  Nancy Vasquez MRN: [id number]  DOB: [birth year] Age / Sex: 82 y.o., female   PCP: Macy Mis, MD         Medical Service: Internal Medicine Teaching Service         Attending Physician: Dr. Reymundo Poll, MD    First Contact: Dr. Crissie Sickles, MD Pager: [phone]  Second Contact: Dr. Champ Mungo, [place] Pager: [phone]       After Hours (After 5p/  First Contact Pager: [phone]  weekends / holidays): Second Contact Pager: [phone]   Chief Complaint: right labial pain  History of Present Illness:  Nancy Vasquez is a 82 year old person living with a history of CAD, TIA, HLD, OA s/p totak knee arthoplasty in 2021 who presents today for swelling in the right labia that started 2 days ago. Seen PCP on 04/07/2023 and started on doxycyline. She has taken this for 2 days and report improvement in swelling in her labia, but continue to have significant swelling, redness and pain. Does not have any itching, vaginal drainage, or bleeding. Not having any urinary symptoms since this started. Took tylenol with much improved her pain. Denies any fever, chills, chest pain, n/v/d, or.dysuria.    Meds:  Current Meds  Medication Sig   acetaminophen (TYLENOL) 650 MG CR tablet Take 650 mg by mouth as needed for pain.   atorvastatin (LIPITOR) 40 MG tablet Take 1 tablet by mouth daily.   clopidogrel (PLAVIX) 75 MG tablet Take 75 mg by mouth daily.   Vitamin D, Ergocalciferol, (DRISDOL) 1.25 MG (50000 UNIT) CAPS capsule Take 50,000 Units by mouth once a week.    Allergies: Allergies as of 04/08/2023 - Review Complete 04/08/2023  Allergen Reaction Noted   Ibuprofen Nausea And Vomiting and Other (See Comments) 09/10/2015   Oxycodone Nausea Only 09/28/2022   Past Medical History:  Diagnosis Date   Arthritis    has had right hip replacement   Coronary artery disease 09/27/2022    Family History: Diabetes(mother), heart disease( brother,  Hypertension(mother)  Social History: Lives with husband. Has 1 son in [place], other children live out of state. Ambulated with a cane. Independent of ADLs and IADLs. Denies tobacco, alcohol. Or illicit drug use. No currently sexually active. Reports only partner is her husband. PCP Delbert Harness, MD at [facility]  Review of Systems: A complete ROS was negative except as per HPI.   Physical Exam: Blood pressure 130/68, pulse 68, temperature (!) 97.5 F (36.4 C), temperature source Oral, resp. rate 17, height 5\' 2"  (1.575 m), SpO2 95 %. Constitutional: Appears well-developed and well-nourished. No distress.  HENT: Normocephalic and atraumatic, EOMI, conjunctiva normal, moist mucous membranes Cardiovascular: Normal rate, regular rhythm, soft systolic murmur,  Distal pulses intact, no JVD Respiratory: No respiratory distress, no accessory muscle use.  Effort is normal.  Lungs are clear to auscultation bilaterally. GI: Nondistended, soft, nontender to palpation, normal active bowel sounds Musculoskeletal: Normal bulk and tone.  No peripheral edema noted. Healed surgical scar over left knee Neurological: Is alert and oriented x4, no apparent focal deficits noted. Skin: Swelling with superficial purulent area of drainage of the right labia, no vaginal drainage.  Psychiatric: Normal mood and affect.     Assessment & Plan by Problem: Principal Problem:   Cellulitis  Cellulitis of the right labia Reports 2 days of swelling in her right labia. Has  had 2 days of doxycyline. CT with diffuse soft tissue stranding consistent wit cellulitis. No well defined fluid collections. Small amount of purulence from area of folliculitis. No vaginal discharge. Hemodynamically stable without signs of sepsis.  -Antibiotics with Vancomycin and zosyn -Vaginal swab to check for STI, GV, candida -Tylenol PRN pain and fever -Follow up blood cultures  CAD Multiple vessel disease on cardiac cath on 218. Follow with  Dr. Sharyn Lull. Treated medically. No cardiac symptoms today.  -Continue statin and plavix.  History of TIA HLD TIA in October 2024 on plavix 75 mg daily and statin. Had ambulatory cardiac monitoring for concern for embolic events. Unable to see results. Need to follow up with cardiology and neurology regarding this. - continue plavix and statin  HFpEF History of Rheumatic Heart Disease Last echo 09/28/2022 EF 55-60%, Grade II diastolic dysfunction. Trivial mitral valve regurgitation. No signs of fluid overload. Follow Dr. Sharyn Lull. Does have systolic murmur but I do not think this is new..Does not have signs of systemic illness.  -Follow up blood cultures.  -monitor I/Os  Dispo: Admit patient to Observation with expected length of stay less than 2 midnights.  Signed: Quincy Simmonds, MD 04/09/2023, 1:34 PM  Pager: [phone] After 5pm on weekdays and 1pm on weekends: On Call pager: [phone]

## 2023-04-09 NOTE — ED Notes (Signed)
ED TO INPATIENT HANDOFF REPORT  ED Nurse Name and Phone #: [phone]  S Name/Age/Gender Nancy Vasquez 82 y.o. female Room/Bed: 043C/043C  Code Status   Code Status: DNR  Home/SNF/Other Home Patient oriented to: self, place, time, and situation Is this baseline? Yes   Triage Complete: Triage complete  Chief Complaint Cellulitis [L03.90]  Triage Note Pt has nodule on R groin- nondraining. Been taking doxy for it. Pain is not improving.    Allergies Allergies  Allergen Reactions   Ibuprofen Nausea And Vomiting and Other (See Comments)    "burns stomach"   Oxycodone Nausea Only    "Burns stomach"    Level of Care/Admitting Diagnosis ED Disposition     ED Disposition  Admit   Condition  --   Comment  Hospital Area: [facility] [[id number]]  Level of Care: Med-Surg [16]  May place patient in observation at [facility] if equivalent level of care is available:: No  Covid Evaluation: Asymptomatic - no recent exposure (last 10 days) testing not required  Diagnosis: Cellulitis [119147][[id number]]  Admitting Physician: Reymundo PollGUILLOUD, CAROLYN [[id number]]  Attending Physician: Reymundo PollGUILLOUD, CAROLYN [[id number]]          B Medical/Surgery History Past Medical History:  Diagnosis Date   Arthritis    has had right hip replacement   Coronary artery disease 09/27/2022   Past Surgical History:  Procedure Laterality Date   ABDOMINAL HYSTERECTOMY     BREAST EXCISIONAL BIOPSY Left    CORONARY PRESSURE/FFR STUDY N/A 03/17/2017   Procedure: Intravascular Pressure Wire/FFR Study;  Surgeon: Rinaldo CloudMohan Harwani, MD;  [facility];  Service: Cardiovascular;  Laterality: N/A;  mid LAD   LEFT HEART CATH AND CORONARY ANGIOGRAPHY N/A 03/17/2017   Procedure: Left Heart Cath and Coronary Angiography;  Surgeon: Rinaldo CloudMohan Harwani, MD;  [facility];  Service: Cardiovascular;  Laterality: N/A;   TONSILLECTOMY     Removed as a child   TOTAL KNEE ARTHROPLASTY  Left 04/19/2020   Procedure: LEFT TOTAL KNEE ARTHROPLASTY;  Surgeon: Cammy Copaean, Gregory Scott, MD;  [facility];  Service: Orthopedics;  Laterality: Left;     A IV Location/Drains/Wounds Patient Lines/Drains/Airways Status     Active Line/Drains/Airways     Name Placement date Placement time Site Days   Peripheral IV 04/09/23 20 G Right Antecubital 04/09/23  0605  Antecubital  less than 1   Incision (Closed) 04/19/20 Knee 04/19/20  1510  -- 1085            Intake/Output Last 24 hours  Intake/Output Summary (Last 24 hours) at 04/09/2023 1346 Last data filed at 04/09/2023 0704 Gross per 24 hour  Intake 500 ml  Output --  Net 500 ml    Labs/Imaging Results for orders placed or performed during the hospital encounter of 04/08/23 (from the past 48 hour(s))  CBC with Differential     Status: Abnormal   Collection Time: 04/09/23 12:11 AM  Result Value Ref Range   WBC 10.3 4.0 - 10.5 K/uL   RBC 3.96 3.87 - 5.11 MIL/uL   Hemoglobin 12.1 12.0 - 15.0 g/dL   HCT 46.937.0 62.936.0 - 52.846.0 %   MCV 93.4 80.0 - 100.0 fL   MCH 30.6 26.0 - 34.0 pg   MCHC 32.7 30.0 - 36.0 g/dL   RDW 41.311.8 24.411.5 - 01.015.5 %   Platelets 261 150 - 400 K/uL   nRBC 0.0 0.0 - 0.2 %   Neutrophils Relative % 69 %  Neutro Abs 7.0 1.7 - 7.7 K/uL   Lymphocytes Relative 20 %   Lymphs Abs 2.1 0.7 - 4.0 K/uL   Monocytes Relative 11 %   Monocytes Absolute 1.2 (H) 0.1 - 1.0 K/uL   Eosinophils Relative 0 %   Eosinophils Absolute 0.0 0.0 - 0.5 K/uL   Basophils Relative 0 %   Basophils Absolute 0.0 0.0 - 0.1 K/uL   Immature Granulocytes 0 %   Abs Immature Granulocytes 0.02 0.00 - 0.07 K/uL    Comment: Performed at [facility], [address], [place], [place] [address]  Basic metabolic panel     Status: Abnormal   Collection Time: 04/09/23 12:11 AM  Result Value Ref Range   Sodium 139 135 - 145 mmol/L   Potassium 3.8 3.5 - 5.1 mmol/L   Chloride 105 98 - 111 mmol/L   CO2 22 22 - 32 mmol/L   Glucose, Bld 115 (H) 70 -  99 mg/dL    Comment: Glucose reference range applies only to samples taken after fasting for at least 8 hours.   BUN 8 8 - 23 mg/dL   Creatinine, Ser 6.75 0.44 - 1.00 mg/dL   Calcium 9.0 8.9 - 91.6 mg/dL   GFR, Estimated >38 >46 mL/min    Comment: (NOTE) Calculated using the CKD-EPI Creatinine Equation (2021)    Anion gap 12 5 - 15    Comment: Performed at [facility], [address], [place], [place] [address]   CT ABDOMEN PELVIS W CONTRAST  Result Date: 04/09/2023 CLINICAL DATA:  Evaluate for right labial abscess. EXAM: CT ABDOMEN AND PELVIS WITH CONTRAST TECHNIQUE: Multidetector CT imaging of the abdomen and pelvis was performed using the standard protocol following bolus administration of intravenous contrast. RADIATION DOSE REDUCTION: This exam was performed according to the departmental dose-optimization program which includes automated exposure control, adjustment of the mA and/or kV according to patient size and/or use of iterative reconstruction technique. CONTRAST:  50mL OMNIPAQUE IOHEXOL 350 MG/ML SOLN COMPARISON:  11/19/2014 FINDINGS: Lower chest: No acute abnormality. Hepatobiliary: Unchanged right lobe of liver cysts measuring up to 8 mm. Cholecystectomy. Mild intrahepatic bile duct dilatation is unchanged. Similar appearance of increase caliber of the CBD which measures up to 1.2 cm. Findings are favored to represent post cholecystectomy physiology. Pancreas: Unremarkable. No pancreatic ductal dilatation or surrounding inflammatory changes. Spleen: Normal in size without focal abnormality. Adrenals/Urinary Tract: Normal adrenal glands. 5 mm too small to characterize low-density lesion within the inferior pole of the right kidney compatible with a Bosniak class 2 cyst. No follow-up imaging recommended. No signs of nephrolithiasis or hydronephrosis. Pelvic organs are obscured by streak artifact from right hip arthroplasty device. Bladder appears grossly unremarkable. Stomach/Bowel:  Small hiatal hernia. Stomach otherwise normal. The appendix is not visualized. No secondary signs of acute appendicitis. Sigmoid diverticulosis without signs of acute diverticulitis. No pathologic dilatation of the large or small bowel loops. Vascular/Lymphatic: Aortic atherosclerosis. No signs of abdominal adenopathy. Right external iliac lymph node measures 1 cm short axis, image 65/3. Several small enhancing nodes are identified within the right inguinal region, partially obscured by streak artifact. Reproductive: The uterus is not visualized and is presumed to be surgically absent. No adnexal mass identified. Other: There is asymmetric enlargement of the right labia with diffuse subcutaneous soft tissue stranding and overlying skin thickening. Small enhancing area of phlegmon formation is noted within the central aspect of the right labia which measures 1.7 x 1.1, image 42/1. No well-defined fluid  collections identified to suggest a drainable abscess. 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 No acute or suspicious osseous findings. Lumbar degenerative disc disease. IMPRESSION: 1. Asymmetric enlargement of the right labia with diffuse subcutaneous soft tissue stranding and overlying skin thickening compatible with cellulitis. Central area of phlegmon formation is identified, but no well-defined fluid collections identified to suggest a drainable abscess. 2. Borderline enlarged right external iliac lymph node is likely reactive. 3. Sigmoid diverticulosis without signs of acute diverticulitis. 4. Aortic Atherosclerosis (ICD10-I70.0). Electronically Signed   By: Signa Kell M.D.   On: 04/09/2023 08:08    Pending Labs Unresulted Labs (From admission, onward)     Start     Ordered   04/10/23 0500  CBC  Tomorrow morning,   R        04/09/23 1113   04/10/23 0500  Basic metabolic panel  Tomorrow morning,   R        04/09/23 1113   04/09/23 0433  Blood culture  (routine x 2)  BLOOD CULTURE X 2,   R      04/09/23 0433            Vitals/Pain Today's Vitals   04/09/23 0321 04/09/23 0500 04/09/23 1248 04/09/23 1300  BP: 129/78 130/68    Pulse: 74 68    Resp: 18 17    Temp: 98.3 F (36.8 C) 97.9 F (36.6 C) (!) 97.5 F (36.4 C)   TempSrc:   Oral   SpO2: 97% 95%    Height:    5\' 2"  (1.575 m)    Isolation Precautions No active isolations  Medications Medications  rivaroxaban (XARELTO) tablet 10 mg (10 mg Oral Given 04/09/23 1235)  clopidogrel (PLAVIX) tablet 75 mg (75 mg Oral Given 04/09/23 1235)  atorvastatin (LIPITOR) tablet 40 mg (40 mg Oral Given 04/09/23 1236)  acetaminophen (TYLENOL) tablet 1,000 mg (1,000 mg Oral Given 04/09/23 1235)  acetaminophen (TYLENOL) tablet 1,000 mg (1,000 mg Oral Given 04/09/23 0556)  sodium chloride 0.9 % bolus 500 mL (0 mLs Intravenous Stopped 04/09/23 0704)  iohexol (OMNIPAQUE) 350 MG/ML injection 75 mL (75 mLs Intravenous Contrast Given 04/09/23 0713)  cefTRIAXone (ROCEPHIN) 1 g in sodium chloride 0.9 % 100 mL IVPB (0 g Intravenous Stopped 04/09/23 0959)    Mobility     Focused Assessments    R Recommendations: See Admitting Provider Note  Report given to:   Additional Notes:

## 2023-04-09 NOTE — ED Provider Notes (Signed)
Patient care assumed at shift handoff from [facility], [place].  For full details  Patient presents to the emergency department complaining of right-sided groin pain.  Reported that her right labia majora has been swelling for the past 3 to 4 days.  She was seen by her primary care provider and was prescribed doxycycline.  She states she is taken 3 doses of this but the area has continued to become more red, swollen, and painful.  Patient does deny fevers, nausea, vomiting, syncope, shortness of breath at home. Physical Exam  BP 130/68   Pulse 68   Temp 97.9 F (36.6 C)   Resp 17   SpO2 95%   Physical Exam  Procedures  Procedures  ED Course / MDM    Medical Decision Making Amount and/or Complexity of Data Reviewed Labs: ordered. Radiology: ordered.  Risk OTC drugs. Prescription drug management. Decision regarding hospitalization.   Concern for cellulitis with outpatient failure versus abscess development.  I reviewed the results of the CT abdomen pelvis with contrast. 1. Asymmetric enlargement of the right labia with diffuse  subcutaneous soft tissue stranding and overlying skin thickening  compatible with cellulitis. Central area of phlegmon formation is  identified, but no well-defined fluid collections identified to  suggest a drainable abscess.  2. Borderline enlarged right external iliac lymph node is likely  reactive.  3. Sigmoid diverticulosis without signs of acute diverticulitis.  4. Aortic Atherosclerosis  I agree with the radiologist findings  There does not appear to be a drainable abscess at this time.  Plan for hospital admission for IV antibiotics.  I requested consultation with the medicine service. The internal medicine teaching service agreed to see the patient for admission.       Pamala Duffel 04/09/23 1602    Tegeler, Canary Brim, MD 04/10/23 [phone]

## 2023-04-09 NOTE — Progress Notes (Signed)
Pharmacy Antibiotic Note  Nancy Vasquez is a 82 y.o. female admitted on 04/08/2023 with swelling in the R labia x 3-4 days with pustules. Seen by PCP on 4/9 and started on doxycycline, however redness, swelling, and pain has worsened. CTAP consistent with cellulitis of R labia and area of phlegmon formation. Pharmacy has been consulted for vancomycin and zosyn dosing.  Weight 03/17/23 = 73.2kg, Scr 0.72 (baseline) WBC 10.3, afebrile  Plan: Vancomycin 1500mg  IV x1, followed by Vancomycin 750mg  IV q24 hours (eAUC 489, Scr 0.8, BMI borderline for Vd adjustment --> rounded to 0.5L/kg) Zosyn 3.375g IV q8 hours extended infusion F/u updated weight and adjust dosing as needed Monitor renal function, ability to narrow antibiotics, vanc levels as necessary  Height: 5\' 2"  (157.5 cm) IBW/kg (Calculated) : 50.1  Temp (24hrs), Avg:98.1 F (36.7 C), Min:97.5 F (36.4 C), Max:98.5 F (36.9 C)  Recent Labs  Lab 04/09/23 0011  WBC 10.3  CREATININE 0.72    CrCl cannot be calculated (Unknown ideal weight.).    Allergies  Allergen Reactions   Ibuprofen Nausea And Vomiting and Other (See Comments)    "burns stomach"   Oxycodone Nausea Only    "Burns stomach"    Antimicrobials this admission: Ceftriaxone 4/11 x1 Vancomycin 4/11 >>  Zosyn 4/11 >>  Dose adjustments this admission:  Microbiology results: 4/11 BCx: sent  Thank you for allowing pharmacy to be a part of this patient's care.  Rexford Maus, PharmD, BCPS 04/09/2023 2:32 PM

## 2023-04-09 NOTE — ED Provider Notes (Signed)
[place] [facility]   CSN: [id number] Arrival date & time: 04/08/23  2340     History  Chief Complaint  Patient presents with   Abscess    Nancy Vasquez is a 82 y.o. female.   Abscess  Patient is a 82 year old female with past medical history significant for arthritis, CAD  She is present emergency room today with complaints of right groin pain  Seems that her right labia majora has been swelling for the past 3 to 4 days.  She states that she was seen by PCP and started on doxycycline and has taken 3 doses of this but states that the areas become more red and swollen and painful. She denies any fevers at home no nausea vomiting sweating syncope or near syncope.  She denies any chest pain or shortness of breath.  No other associate symptoms.  She has taken no medications other than doxycycline for her symptoms.     Home Medications Prior to Admission medications   Medication Sig Start Date End Date Taking? Authorizing Provider  acetaminophen (TYLENOL) 650 MG CR tablet Take 650 mg by mouth as needed for pain. 02/21/18   [provider]  atorvastatin (LIPITOR) 40 MG tablet Take 1 tablet (40 mg total) by mouth daily. 09/29/22 11/06/22  Berton Mountgbata, Sylvester I, MD  atorvastatin (LIPITOR) 40 MG tablet Take 1 tablet by mouth daily. 09/29/22   [provider]  carbamide peroxide (DEBROX) 6.5 % OTIC solution Place 5 drops into both ears 2 (two) times daily. Patient not taking: Reported on 03/17/2023 12/14/22   Terald Sleeperrifan, Matthew J, MD  clopidogrel (PLAVIX) 75 MG tablet Take 75 mg by mouth daily.    [provider]  Vitamin D, Ergocalciferol, (DRISDOL) 1.25 MG (50000 UNIT) CAPS capsule Take 50,000 Units by mouth once a week.    [provider]      Allergies    Ibuprofen and Oxycodone    Review of Systems   Review of Systems  Physical Exam Updated Vital Signs BP 130/68   Pulse 68   Temp 97.9 F  (36.6 C)   Resp 17   SpO2 95%  Physical Exam Vitals and nursing note reviewed. Exam conducted with a chaperone present (Dr. Nicanor AlconPalumbo present as chaparone for exam).  Constitutional:      General: She is not in acute distress.    Appearance: Normal appearance. She is not ill-appearing.     Comments: Patient is nontoxic-appearing   HENT:     Head: Normocephalic and atraumatic.     Nose: Nose normal.  Eyes:     General: No scleral icterus.       Right eye: No discharge.        Left eye: No discharge.     Conjunctiva/sclera: Conjunctivae normal.  Cardiovascular:     Rate and Rhythm: Normal rate and regular rhythm.     Pulses: Normal pulses.     Heart sounds: Normal heart sounds.  Pulmonary:     Effort: Pulmonary effort is normal.  Abdominal:     Palpations: Abdomen is soft.     Tenderness: There is no abdominal tenderness. There is no guarding or rebound.  Musculoskeletal:     Cervical back: Normal range of motion.     Right lower leg: No edema.     Left lower leg: No edema.  Skin:    General: Skin is warm and dry.     Capillary Refill: Capillary refill  takes less than 2 seconds.     Comments: Right labia is swollen with 2 visible pustules and underlying swelling on erythematous base. Quite tender to touch  Neurological:     Mental Status: She is alert and oriented to person, place, and time. Mental status is at baseline.  Psychiatric:        Mood and Affect: Mood normal.        Behavior: Behavior normal.     ED Results / Procedures / Treatments   Labs (all labs ordered are listed, but only abnormal results are displayed) Labs Reviewed  CBC WITH DIFFERENTIAL/PLATELET - Abnormal; Notable for the following components:      Result Value   Monocytes Absolute 1.2 (*)    All other components within normal limits  BASIC METABOLIC PANEL - Abnormal; Notable for the following components:   Glucose, Bld 115 (*)    All other components within normal limits  CULTURE, BLOOD  (ROUTINE X 2)  CULTURE, BLOOD (ROUTINE X 2)    EKG None  Radiology No results found.  Procedures Procedures    Medications Ordered in ED Medications  acetaminophen (TYLENOL) tablet 1,000 mg (1,000 mg Oral Given 04/09/23 0556)  sodium chloride 0.9 % bolus 500 mL (500 mLs Intravenous New Bag/Given 04/09/23 0606)    ED Course/ Medical Decision Making/ A&P                             Medical Decision Making Amount and/or Complexity of Data Reviewed Radiology: ordered.  Risk OTC drugs.   This patient presents to the ED for concern of groin pain, this involves a number of treatment options, and is a complaint that carries with it a moderate risk of complications and morbidity. A differential diagnosis was considered for the patient's symptoms which is discussed below:   Fournier's gangrene, tracking/deep abscess, pustules, superficial abscess   Co morbidities: Discussed in HPI   Brief History:  Patient is a 82 year old female with past medical history significant for arthritis, CAD  She is present emergency room today with complaints of right groin pain  Seems that her right labia majora has been swelling for the past 3 to 4 days.  She states that she was seen by PCP and started on doxycycline and has taken 3 doses of this but states that the areas become more red and swollen and painful. She denies any fevers at home no nausea vomiting sweating syncope or near syncope.  She denies any chest pain or shortness of breath.  No other associate symptoms.  She has taken no medications other than doxycycline for her symptoms.    EMR reviewed including pt PMHx, past surgical history and past visits to ER.   See HPI for more details   Lab Tests:   I personally reviewed all laboratory work and imaging. Metabolic panel without any acute abnormality specifically kidney function within normal limits and no significant electrolyte abnormalities. CBC without leukocytosis or  significant anemia. She does have the upper end of normal of WBC  Imaging Studies:  CT abdomen pelvis with contrast ordered.    Cardiac Monitoring:  NA NA   Medicines ordered:  I ordered medication including Tylenol for pain and crystalloid for hydration Reevaluation of the patient after these medicines showed that the patient improved I have reviewed the patients home medicines and have made adjustments as needed   Critical Interventions:     Consults/Attending Physician  I discussed this case with my attending physician who cosigned this note including patient's presenting symptoms, physical exam, and planned diagnostics and interventions. Attending physician stated agreement with plan or made changes to plan which were implemented.   Attending physician assessed patient at bedside.    Reevaluation:  After the interventions noted above I re-evaluated patient and found that they have :stayed the same   Social Determinants of Health:      Problem List / ED Course:  Patient with pelvic abscess.  CT abdomen pelvis with contrast ordered to further evaluate and see if there is any deep space infection.  Will need to follow-up on CT scan.  Morning team will follow-up on this imaging and reevaluate.   Dispostion:     Final Clinical Impression(s) / ED Diagnoses Final diagnoses:  Abscess of pubic region    Rx / DC Orders ED Discharge Orders     None         Gailen Shelter, [place] 04/09/23 6803    Palumbo, April, MD 04/09/23 2122

## 2023-04-10 DIAGNOSIS — L03818 Cellulitis of other sites: Secondary | ICD-10-CM | POA: Diagnosis not present

## 2023-04-10 DIAGNOSIS — L739 Follicular disorder, unspecified: Secondary | ICD-10-CM

## 2023-04-10 HISTORY — DX: Follicular disorder, unspecified: L73.9

## 2023-04-10 LAB — CBC
HCT: 37.4 % (ref 36.0–46.0)
Hemoglobin: 12.2 g/dL (ref 12.0–15.0)
MCH: 30.5 pg (ref 26.0–34.0)
MCHC: 32.6 g/dL (ref 30.0–36.0)
MCV: 93.5 fL (ref 80.0–100.0)
Platelets: 270 10*3/uL (ref 150–400)
RBC: 4 MIL/uL (ref 3.87–5.11)
RDW: 11.7 % (ref 11.5–15.5)
WBC: 8.2 10*3/uL (ref 4.0–10.5)
nRBC: 0 % (ref 0.0–0.2)

## 2023-04-10 LAB — CERVICOVAGINAL ANCILLARY ONLY
Bacterial Vaginitis (gardnerella): NEGATIVE
Candida Glabrata: NEGATIVE
Candida Vaginitis: NEGATIVE
Chlamydia: NEGATIVE
Comment: NEGATIVE
Comment: NEGATIVE
Comment: NEGATIVE
Comment: NEGATIVE
Comment: NEGATIVE
Comment: NORMAL
Neisseria Gonorrhea: NEGATIVE
Trichomonas: NEGATIVE

## 2023-04-10 LAB — BASIC METABOLIC PANEL
Anion gap: 9 (ref 5–15)
BUN: 11 mg/dL (ref 8–23)
CO2: 19 mmol/L — ABNORMAL LOW (ref 22–32)
Calcium: 9.1 mg/dL (ref 8.9–10.3)
Chloride: 108 mmol/L (ref 98–111)
Creatinine, Ser: 0.85 mg/dL (ref 0.44–1.00)
GFR, Estimated: >60 mL/min (ref >60)
Glucose, Bld: 91 mg/dL (ref 70–99)
Potassium: 4.2 mmol/L (ref 3.5–5.1)
Sodium: 136 mmol/L (ref 135–145)

## 2023-04-10 MED ORDER — AMOXICILLIN-POT CLAVULANATE 875-125 MG PO TABS: 1.0000 | ORAL_TABLET | Freq: Two times a day (BID) | ORAL | 0 refills | Status: AC

## 2023-04-10 MED ORDER — DOXYCYCLINE HYCLATE 100 MG PO TABS: 100.0000 mg | ORAL_TABLET | Freq: Two times a day (BID) | ORAL | 0 refills | Status: AC

## 2023-04-10 MED ORDER — DOXYCYCLINE HYCLATE 100 MG PO TABS
100.0000 mg | ORAL_TABLET | Freq: Two times a day (BID) | ORAL | Status: DC
  Administered 2023-04-10: 100 mg via ORAL
  Filled 2023-04-10: qty 1

## 2023-04-10 MED ORDER — SODIUM CHLORIDE 0.9 % IV SOLN
100.0000 mg | Freq: Two times a day (BID) | INTRAVENOUS | Status: DC
  Filled 2023-04-10: qty 100

## 2023-04-10 MED ORDER — AMOXICILLIN-POT CLAVULANATE 875-125 MG PO TABS: 1.0000 | ORAL_TABLET | Freq: Two times a day (BID) | ORAL | Status: DC

## 2023-04-10 NOTE — Discharge Summary (Signed)
Name: Nancy Vasquez MRN: [id number] DOB: [birth year] 82 y.o. PCP: Macy Mis, MD  Date of Admission: 04/08/2023 11:49 PM Date of Discharge: 04-10-2023 Attending Physician: Reymundo Poll, MD  Discharge Diagnosis: Principal Problem:   Cellulitis     Discharge Medications: Allergies as of 04/10/2023       Reactions   Ibuprofen Nausea And Vomiting, Other (See Comments)   "burns stomach"   Oxycodone Nausea Only   "Burns stomach"        Medication List     TAKE these medications    acetaminophen 650 MG CR tablet Commonly known as: TYLENOL Take 650 mg by mouth as needed for pain.   amoxicillin-clavulanate 875-125 MG tablet Commonly known as: AUGMENTIN Take 1 tablet by mouth every 12 (twelve) hours for 6 days.   atorvastatin 40 MG tablet Commonly known as: LIPITOR Take 1 tablet (40 mg total) by mouth daily. What changed: Another medication with the same name was removed. Continue taking this medication, and follow the directions you see here.   carbamide peroxide 6.5 % OTIC solution Commonly known as: DEBROX Place 5 drops into both ears 2 (two) times daily.   clopidogrel 75 MG tablet Commonly known as: PLAVIX Take 75 mg by mouth daily.   doxycycline 100 MG tablet Commonly known as: VIBRA-TABS Take 1 tablet (100 mg total) by mouth every 12 (twelve) hours for 6 days. What changed: when to take this   Vitamin D (Ergocalciferol) 1.25 MG (50000 UNIT) Caps capsule Commonly known as: DRISDOL Take 50,000 Units by mouth once a week.         Disposition and follow-up:    Nancy.Nancy Vasquez was discharged from [facility] in Good condition.  At the hospital follow up visit please address:   1.  Labial cellulitis and folliculitis: inquire about resolution of symptoms including swelling and pain, confirm completion of amoxicillin-clavulanate and doxycycline antibiotic regimen, examine right labia majora  2.  Transient ischemic attack:  confirm adherence to atorvastatin and clopidogrel  3.  Heart failure: inquire about respiratory symptoms, assess volume status  4. Labs / imaging needed at time of follow-up: none  5.  Pending labs / tests needing follow-up: none    Follow-up Appointments:  Internal Medicine Clinic in 1-2 weeks    Hospital Course by problem list:  Nancy Vasquez is a 82 year old female with a past medical history of hyperlipidemia, coronary artery disease, osteoarthritis, and transient ischemic attack who presented with right groin pain, now admitted for treatment of labial cellulitis.       ---Right labial folliculitis complicated by cellulitis Patient developed groin pain, swelling, and redness that started about three days ago. Presented to her primary care provider and was prescribed doxycycline on 4-9, but symptoms have persisted. Upon arrival, vitals including temperature were unremarkable. Normal WBC. Exam notable for edematous erythematous 7-10cm patch with one large pustule involving right labia majora. Pelvic CT demonstrated diffuse soft tissue stranding without any well-defined fluid collections, consistent with cellulitis. Intravenous vancomycin and piperacillin-tazobactam started on admission. Edema, pain, and erythema improved with antibiotic regimen. Pustules were drained bedside prior to discharge. Vitals and WBC count remained stable throughout hospitalization. Blood cultures zero growth to date. Discharged with seven-day course of amoxicillin-clavulanate and doxycycline.     ---Coronary artery disease  ---Hyperlipidemia Patient has history of hyperlipidemia and coronary artery disease. Most recent lipid panel collected 11-2022 demonstrated LDL 68. Home medications include atorvastatin and clopidogrel. These medications were continued throughout hospitalization and  upon discharge.     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 At that time, ambulatory cardiac monitoring was performed out of concern for an embolic event. Recommend follow-up with cardiology and neurology as outpatient. Home medications include atorvastatin and clopidogrel. These medications were continued throughout hospitalization and upon discharge.     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 Most recent echocardiogram performed 09-2022 demonstrated EF 55-60, grade II diastolic dysfunction, and trivial MV regurgitation. Denied breath shortness, chest pain, orthopnea, and lower extremity swelling. Exam negative for signs of volume overload and systemic illness.    Discharge Exam:    BP 129/61 (BP Location: Left Arm)   Pulse 63   Temp 98.6 F (37 C) (Oral)   Resp 16   Ht 5\' 2"  (1.575 m)   Wt 72.6 kg   SpO2 98%   BMI 29.27 kg/m   Subjective: Nancy Vasquez was feeling good this morning, states that her groin pain and swelling has improved. Denies fever, chills, dysuria, itching, discharge, and urine discoloration. Discussed plan to continue treating with antibiotics and discharge later today. Patient expressed understanding of and agreement with this plan.   General:                       awake and alert, sitting comfortably in chair, cooperative, not in acute distress Lungs:                          normal respiratory effort, breathing unlabored, symmetrical chest rise Genital:                     mildly erythematous 5-7cm patch with several pustules over right labia majora, some clear and red-colored discharge  Neurologic:                   oriented to person-place-time, moving all extremities, no gross focal deficits Psychiatric:                   euthymic mood with congruent affect, intelligible speech   Pertinent Labs, Studies, and Procedures:   Labs:    Latest Ref Rng & Units 04/10/2023    8:56 AM  04/09/2023   12:11 AM 12/14/2022    9:24 AM  CBC  WBC 4.0 - 10.5 K/uL 8.2  10.3  4.9   Hemoglobin 12.0 - 15.0 g/dL 82.9  56.2  13.0   Hematocrit 36.0 - 46.0 % 37.4  37.0  38.1   Platelets 150 - 400 K/uL 270  261  269       Latest Ref Rng & Units 04/10/2023    8:56 AM 04/09/2023   12:11 AM 12/14/2022    9:24 AM  CMP  Glucose 70 - 99 mg/dL 91  865  784   BUN 8 - 23 mg/dL 11  8  8    Creatinine 0.44 - 1.00 mg/dL 6.96  2.95  2.84   Sodium 135 - 145 mmol/L 136  139  140   Potassium 3.5 - 5.1 mmol/L 4.2  3.8  4.9   Chloride 98 - 111 mmol/L 108  105  108   CO2 22 - 32 mmol/L 19  22  25    Calcium 8.9 - 10.3 mg/dL 9.1  9.0  9.2   Total Protein  6.5 - 8.1 g/dL   6.7   Total Bilirubin 0.3 - 1.2 mg/dL   1.2   Alkaline Phos 38 - 126 U/L   48   AST 15 - 41 U/L   22   ALT 0 - 44 U/L   16     ______________________  Imaging:  CT ABDOMEN PELVIS W CONTRAST Result Date: 04/09/2023 IMPRESSION: 1. Asymmetric enlargement of the right labia with diffuse subcutaneous soft tissue stranding and overlying skin thickening compatible with cellulitis. Central area of phlegmon formation is identified, but no well-defined fluid collections identified to suggest a drainable abscess. 2. Borderline enlarged right external iliac lymph node is likely reactive. 3. Sigmoid diverticulosis without signs of acute diverticulitis. 4. Aortic Atherosclerosis (ICD10-I70.0).   ______________________  Procedures:  none  ______________________   Discharge Instructions:  Discharge Instructions     Diet - low sodium heart healthy   Complete by: As directed    Discharge instructions   Complete by: As directed    ?Nancy Credeur,  It was a pleasure taking care of you while you were in the hospital. Your groin pain was caused by an infection of the hair follicles and surrounding skin. We treated you with antibiotics and your symptoms improved. We also drained some of the fluid in wound to help facilitate the healing  process.  We are sending you home with two antibiotics, called Augmentin and doxycycline, which you will take for six more days. Additionally, we are scheduling a follow-up visit for you in our Internal Medicine Clinic.   Please return to the emergency department if your pain, swelling, or redness worsens.    All the best,   Increase activity slowly   Complete by: As directed    No wound care   Complete by: As directed      Nancy Milholland,  It was a pleasure taking care of you while you were in the hospital. Your groin pain was caused by an infection of the hair follicles and surrounding skin. We treated you with antibiotics and your symptoms improved. We also drained some of the fluid in wound to help facilitate the healing process.  We are sending you home with two antibiotics, called Augmentin and doxycycline, which you will take for six more days. Additionally, we are scheduling a follow-up visit for you in our Internal Medicine Clinic.   Please return to the emergency department if your pain, swelling, or redness worsens.    All the best,  Signed: Lajuana Ripple, MD Internal Medicine PGY-1 Pager [phone]

## 2023-04-10 NOTE — Plan of Care (Signed)
Patient alert and oriented. IV Access removed. Patient discharged and transported to lobby for transportation home via husband.  Problem: Education: Goal: Knowledge of General Education information will improve Description: Including pain rating scale, medication(s)/side effects and non-pharmacologic comfort measures Outcome: Adequate for Discharge   Problem: Health Behavior/Discharge Planning: Goal: Ability to manage health-related needs will improve Outcome: Adequate for Discharge   Problem: Clinical Measurements: Goal: Ability to maintain clinical measurements within normal limits will improve Outcome: Adequate for Discharge Goal: Will remain free from infection Outcome: Adequate for Discharge Goal: Diagnostic test results will improve Outcome: Adequate for Discharge Goal: Respiratory complications will improve Outcome: Adequate for Discharge Goal: Cardiovascular complication will be avoided Outcome: Adequate for Discharge   Problem: Activity: Goal: Risk for activity intolerance will decrease Outcome: Adequate for Discharge   Problem: Coping: Goal: Level of anxiety will decrease Outcome: Adequate for Discharge   Problem: Elimination: Goal: Will not experience complications related to bowel motility Outcome: Adequate for Discharge Goal: Will not experience complications related to urinary retention Outcome: Adequate for Discharge   Problem: Safety: Goal: Ability to remain free from injury will improve Outcome: Adequate for Discharge   Problem: Skin Integrity: Goal: Risk for impaired skin integrity will decrease Outcome: Adequate for Discharge

## 2023-04-14 LAB — CULTURE, BLOOD (ROUTINE X 2)
Culture: NO GROWTH
Culture: NO GROWTH
Special Requests: ADEQUATE
Special Requests: ADEQUATE

## 2023-04-17 ENCOUNTER — Ambulatory Visit: Payer: Medicare Other | Admitting: Surgical

## 2023-04-23 ENCOUNTER — Ambulatory Visit (INDEPENDENT_AMBULATORY_CARE_PROVIDER_SITE_OTHER): Payer: Medicare Other | Admitting: Student

## 2023-04-23 ENCOUNTER — Other Ambulatory Visit (HOSPITAL_COMMUNITY)
Admission: RE | Admit: 2023-04-23 | Discharge: 2023-04-23 | Disposition: A | Discharge status: Home / Self Care | Payer: Medicare Other | Source: Ambulatory Visit | Attending: Internal Medicine | Admitting: Internal Medicine

## 2023-04-23 ENCOUNTER — Encounter: Payer: Self-pay | Admitting: Student

## 2023-04-23 ENCOUNTER — Other Ambulatory Visit: Payer: Self-pay

## 2023-04-23 VITALS — BP 133/68 | HR 65 | Temp 98.1°F | Ht 62.0 in | Wt 159.0 lb

## 2023-04-23 DIAGNOSIS — L03818 Cellulitis of other sites: Secondary | ICD-10-CM

## 2023-04-23 DIAGNOSIS — N898 Other specified noninflammatory disorders of vagina: Secondary | ICD-10-CM | POA: Insufficient documentation

## 2023-04-23 DIAGNOSIS — M1712 Unilateral primary osteoarthritis, left knee: Secondary | ICD-10-CM

## 2023-04-23 DIAGNOSIS — B3731 Acute candidiasis of vulva and vagina: Secondary | ICD-10-CM | POA: Insufficient documentation

## 2023-04-23 DIAGNOSIS — I2510 Atherosclerotic heart disease of native coronary artery without angina pectoris: Secondary | ICD-10-CM

## 2023-04-23 DIAGNOSIS — L299 Pruritus, unspecified: Secondary | ICD-10-CM | POA: Insufficient documentation

## 2023-04-23 DIAGNOSIS — E7800 Pure hypercholesterolemia, unspecified: Secondary | ICD-10-CM

## 2023-04-23 DIAGNOSIS — G459 Transient cerebral ischemic attack, unspecified: Secondary | ICD-10-CM

## 2023-04-23 MED ORDER — ATORVASTATIN CALCIUM 40 MG PO TABS
40.0000 mg | ORAL_TABLET | Freq: Every day | ORAL | 1 refills | Status: DC
Start: 2023-04-23 — End: 2023-08-27

## 2023-04-23 MED ORDER — CLOPIDOGREL BISULFATE 75 MG PO TABS
75.0000 mg | ORAL_TABLET | Freq: Every day | ORAL | 1 refills | Status: DC
  Filled 2023-09-30: qty 90, 90d supply, fill #0

## 2023-04-23 NOTE — Assessment & Plan Note (Addendum)
Patient reports itching in her suprapubic region that began after completion of her oral antibiotics for cellulitis. States that itching is mainly just annoying. She denies any vaginal discharge or any systemic symptoms.   On exam, she does not have any vaginal discharge or any erythema/rash overlying suprapubic region. No intertrigo present.   Given she was recently on antibiotics, checked cervicovaginal ancillary testing with aptima swab to assess for BV and candidal vaginitis (although no discharge noted). This is more likely from skin friction in this area. I have recommended that she keep this area dry and to use petroleum jelly for added protection.  -f/u cervicovaginal ancillary swab -keep dry, petroleum jelly  ADDENDUM: Cervicovaginal ancillary swab positive for Candida vaginitis. Discussed results with patient. Will send 2 tablets of PO fluconazole 150mg . Advised to take 1 tablet and if symptoms persist after 3 days, then to take a second table. She confirms understanding.

## 2023-04-23 NOTE — Progress Notes (Signed)
   CC: hospital f/u visit  HPI:  [name] is a 82 y.o. female with history listed below presenting to the [facility] for hospital f/u visit. Please see individualized problem based charting for full HPI.  Past Medical History:  Diagnosis Date   Arthritis    has had right hip replacement   Coronary artery disease 09/27/2022    Review of Systems:  Negative aside from that listed in individualized problem based charting.  Physical Exam:  Vitals:   04/23/23 1055  BP: 133/68  Pulse: 65  Temp: 98.1 F (36.7 C)  TempSrc: Oral  SpO2: 100%  Weight: 159 lb (72.1 kg)  Height:  (1.575 m)   Physical Exam Exam conducted with a chaperone present.  Constitutional:      Appearance: Normal appearance. She is not ill-appearing.  HENT:     Mouth/Throat:     Mouth: Mucous membranes are moist.     Pharynx: Oropharynx is clear. No oropharyngeal exudate.  Eyes:     General: No scleral icterus.    Extraocular Movements: Extraocular movements intact.     Conjunctiva/sclera: Conjunctivae normal.     Pupils: Pupils are equal, round, and reactive to light.  Cardiovascular:     Rate and Rhythm: Normal rate and regular rhythm.     Heart sounds: Normal heart sounds. No murmur heard.    No friction rub. No gallop.  Pulmonary:     Effort: Pulmonary effort is normal.     Breath sounds: Normal breath sounds. No wheezing, rhonchi or rales.  Abdominal:     General: Bowel sounds are normal. There is no distension.     Palpations: Abdomen is soft.     Tenderness: There is no abdominal tenderness. There is no guarding or rebound.  Genitourinary:    General: Normal vulva.     Exam position: Supine.     Pubic Area: No rash.      Labia:        Right: No rash or tenderness.        Left: No rash, tenderness or lesion.      Vagina: No vaginal discharge.       Comments: Small bump present at demarcated region which is chronic. No purulence or overlying redness/erythema or tenderness. R labia  majora without any rash or erythema noted. No intertrigo noted in bilateral inguinal regions. No vaginal discharge noted. Musculoskeletal:     Comments: L knee with limited ROM in extension and flexion at extremes, chronic. No redness, erythema, or swelling.  Skin:    General: Skin is warm and dry.  Neurological:     General: No focal deficit present.     Mental Status: She is alert and oriented to person, place, and time.  Psychiatric:        Mood and Affect: Mood normal.        Behavior: Behavior normal.      Assessment & Plan:   See Encounters Tab for problem based charting.  Patient discussed with Dr.  Lafonda Mosses

## 2023-04-23 NOTE — Assessment & Plan Note (Signed)
Patient with long-standing OA of left knee. She has required knee replacement in the past. States that this is a chronic issue for her. Her R knee is much better but left knee impacts her functionality. She uses a cane for ambulation. She has tried voltaren gel in the past but this does not help. She has also obtain steroid injections several times in the past, but states that they became ineffective after 1 day and thus she stopped receiving them. She would like to avoid medications. We discussed physical therapy for strength training to help with knee pain. She is interested in this.  Plan: -referral to PT

## 2023-04-23 NOTE — Patient Instructions (Addendum)
Ms. Lague,  It was a pleasure seeing you in the clinic today.   Your infection looks resolved which is good. I do not think you need any more antibiotics at this time. We did check a swab to make sure there is no other cause for your itching. In the mean time, I recommend using vasoline (petroleum jelly) to help provide a barrier over that area. Also, please do your best to keep that area clean and dry. I have refilled your medications and sent them to pharmacy we have listed. Please come back in 3 months for your next visit.  Please call our clinic at [phone] if you have any questions or concerns. The best time to call is Monday-Friday from 9am-4pm, but there is someone available 24/7 at the same number. If you need medication refills, please notify your pharmacy one week in advance and they will send [place] a request.   Thank you for letting us take part in your care. We look forward to seeing you next time!

## 2023-04-23 NOTE — Assessment & Plan Note (Signed)
On lipitor  daily. Most recent lipid panel on 11/2022 with LDL 68. She does have a history of TIA.  -refilled lipitor today

## 2023-04-23 NOTE — Assessment & Plan Note (Signed)
Patient with TIA in 2023 manifested by visual disturbance and aphasia. She returned to her baseline quickly and does not have any residual deficits. She is on ASA and lipitor and has been adherent with these medications. Lipid panel in 11/2022 with LDL 68.

## 2023-04-23 NOTE — Assessment & Plan Note (Signed)
Patient with CAD, LHC in 2018 revealed multivessel CAD. She follows Dr. Sharyn Lull (cardiology). She has a history of rheumatic heart disease as a child. ECHO in 09/2022 with EF 55-60%, G2DD, and trivial MVR. She does not have any active chest pain. She is on ASA and lipitor.

## 2023-04-23 NOTE — Assessment & Plan Note (Signed)
Patient was recently hospitalized for R labial cellulitis after presenting with groin pain, swelling, and redness. She was treated with IV vancomycin and pip-tazo with subsequent improvement in pain, edema, and erythema. Blood cultures were negative. She was ultimately discharged with 7-day course of augmentin and doxycycline which she reports completing about 1 week ago. She does report resolution of previous symptoms and is doing better.   On exam, no signs of cellulitis overlying R labia majora. She does have a small bump on R labia majora that she reports has been present for a long time. No changes in size and no TTP. No purulence or overlying redness noted.

## 2023-04-24 LAB — CERVICOVAGINAL ANCILLARY ONLY
Bacterial Vaginitis (gardnerella): NEGATIVE
Candida Glabrata: NEGATIVE
Candida Vaginitis: POSITIVE — AB
Chlamydia: NEGATIVE
Comment: NEGATIVE
Comment: NEGATIVE
Comment: NEGATIVE
Comment: NEGATIVE
Comment: NEGATIVE
Comment: NORMAL
Neisseria Gonorrhea: NEGATIVE
Trichomonas: NEGATIVE

## 2023-04-24 MED ORDER — FLUCONAZOLE 150 MG PO TABS
150.0000 mg | ORAL_TABLET | Freq: Every day | ORAL | 0 refills | Status: DC
Start: 2023-04-24 — End: 2023-04-30

## 2023-04-24 NOTE — Addendum Note (Signed)
Addended by: Merrilyn Puma on: 04/24/2023 12:27 PM   Modules accepted: Orders

## 2023-04-27 NOTE — Progress Notes (Signed)
Internal Medicine Clinic Attending  Case discussed with Dr. Jinwala  At the time of the visit.  We reviewed the resident's history and exam and pertinent patient test results.  I agree with the assessment, diagnosis, and plan of care documented in the resident's note.  

## 2023-04-30 ENCOUNTER — Encounter: Payer: Self-pay | Admitting: Family Medicine

## 2023-04-30 ENCOUNTER — Ambulatory Visit: Payer: Medicare Other | Attending: Family Medicine | Admitting: Family Medicine

## 2023-04-30 VITALS — BP 125/75 | HR 74 | Ht 62.0 in | Wt 157.0 lb

## 2023-04-30 DIAGNOSIS — E7800 Pure hypercholesterolemia, unspecified: Secondary | ICD-10-CM | POA: Diagnosis not present

## 2023-04-30 DIAGNOSIS — G459 Transient cerebral ischemic attack, unspecified: Secondary | ICD-10-CM | POA: Diagnosis not present

## 2023-04-30 DIAGNOSIS — H6121 Impacted cerumen, right ear: Secondary | ICD-10-CM

## 2023-04-30 DIAGNOSIS — H663X2 Other chronic suppurative otitis media, left ear: Secondary | ICD-10-CM

## 2023-04-30 DIAGNOSIS — M1712 Unilateral primary osteoarthritis, left knee: Secondary | ICD-10-CM

## 2023-04-30 DIAGNOSIS — M79602 Pain in left arm: Secondary | ICD-10-CM

## 2023-04-30 DIAGNOSIS — I2510 Atherosclerotic heart disease of native coronary artery without angina pectoris: Secondary | ICD-10-CM | POA: Diagnosis not present

## 2023-04-30 MED ORDER — CIPROFLOXACIN-DEXAMETHASONE 0.3-0.1 % OT SUSP
4.0000 [drp] | Freq: Two times a day (BID) | OTIC | 0 refills | Status: DC
Start: 2023-04-30 — End: 2023-05-02

## 2023-04-30 NOTE — Progress Notes (Signed)
Soreness in left ear Pain in left shoulder.

## 2023-04-30 NOTE — Patient Instructions (Signed)

## 2023-04-30 NOTE — Progress Notes (Signed)
Subjective:  Patient ID: Nancy Vasquez, female    DOB: April 26, 1941  Age: 82 y.o. MRN: [id number]  CC: New Patient (Initial Visit)   HPI Nancy Vasquez is a 82 y.o. year old female with a history of CAD (followed by Dr Sharyn Lull), TIA , left knee osteoarthritis (s/p left TKR),  right THR, hyperlipidemia, hypertension who presents today to establish care. Previous PCP was at [facility].  Interval History: She remains on Plavix for her TIA and was last seen by [facility] 6 weeks ago.  They feel that she previously had post TIA has resolved.  Also doing well on her statin. She has an upcoming appointment with her cardiologist.  Every now and then she has 'problems with her left ear' and pain sometimes radiates to her neck but now this is gone.This is not associated with hearing loss. At the beginning of the year she did have to put ear drops in her ears.  Her left shoulder has also been hurting for 6-7 months and pain has been mild. She has no problems with ROM and has no radiation of pain , numbness or weakness.  She describes the pain is present in her left upper biceps muscle.  Her left knee continues to hurt with some burning in the anterior aspect and this has been present since her knee replacement. 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   Folliculitis 04/10/2023    Past Surgical History:  Procedure Laterality Date   ABDOMINAL HYSTERECTOMY     BREAST EXCISIONAL BIOPSY Left    CORONARY PRESSURE/FFR STUDY N/A 03/17/2017   Procedure: Intravascular Pressure Wire/FFR Study;  Surgeon: Rinaldo Cloud, MD;  Location: [facility] INVASIVE CV LAB;  Service: Cardiovascular;  Laterality: N/A;  mid LAD   LEFT HEART CATH AND CORONARY ANGIOGRAPHY N/A 03/17/2017   Procedure: Left Heart Cath and Coronary Angiography;  Surgeon: Rinaldo Cloud, MD;  Location: [facility] INVASIVE  CV LAB;  Service: Cardiovascular;  Laterality: N/A;   TONSILLECTOMY     Removed as a child   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   Procedure: LEFT TOTAL KNEE ARTHROPLASTY;  Surgeon: Cammy Copa, MD;  Location: [facility] OR;  Service: Orthopedics;  Laterality: Left;    Family History  Problem Relation Age of Onset   Breast cancer Sister 52       metastatic breast ca    Social History   Socioeconomic History   Marital status: Married    Spouse name: Not on file   Number of children: Not on file   Years of education: Not on file   Highest education level: Not on file  Occupational History   Not on file  Tobacco Use   Smoking status: Never   Smokeless tobacco: Never  Substance and Sexual Activity   Alcohol use: Never   Drug use: Never   Sexual activity: Not on file  Other Topics Concern   Not on file  Social History Narrative   Not on file   Social Determinants of Health   Financial Resource Strain: Low Risk  (04/23/2023)   Overall Financial Resource Strain (CARDIA)    Difficulty of Paying Living Expenses: Not hard at all  Food Insecurity: No Food Insecurity (04/23/2023)   Hunger Vital Sign    Worried About Running Out of Food in the Last Year: Never true    Ran Out  of Food in the Last Year: Never true  Transportation Needs: No Transportation Needs (04/23/2023)   PRAPARE - Administrator, Civil Service (Medical): No    Lack of Transportation (Non-Medical): No  Physical Activity: Insufficiently Active (04/23/2023)   Exercise Vital Sign    Days of Exercise per Week: 7 days    Minutes of Exercise per Session: 10 min  Stress: No Stress Concern Present (04/23/2023)   [employer] of Occupational Health - Occupational Stress Questionnaire    Feeling of Stress : Not at all  Social Connections: Socially Integrated (04/23/2023)   Social Connection and Isolation Panel [NHANES]    Frequency of Communication with Friends and Family: More than three times a week     Frequency of Social Gatherings with Friends and Family: More than three times a week    Attends Religious Services: More than 4 times per year    Active Member of [employer] or Organizations: Yes    Attends Engineer, structural: More than 4 times per year    Marital Status: Married    Allergies  Allergen Reactions   Ibuprofen Nausea And Vomiting and Other (See Comments)    "burns stomach"   Oxycodone Nausea Only    "Burns stomach"    Outpatient Medications Prior to Visit  Medication Sig Dispense Refill   atorvastatin (LIPITOR) 40 MG tablet Take 1 tablet (40 mg total) by mouth daily. 90 tablet 1   clopidogrel (PLAVIX) 75 MG tablet Take 1 tablet (75 mg total) by mouth daily. 90 tablet 1   Vitamin D, Ergocalciferol, (DRISDOL) 1.25 MG (50000 UNIT) CAPS capsule Take 50,000 Units by mouth once a week.     fluconazole (DIFLUCAN) 150 MG tablet Take 1 tablet (150 mg total) by mouth daily. (Patient not taking: Reported on 04/30/2023) 2 tablet 0   No facility-administered medications prior to visit.     ROS Review of Systems  Constitutional:  Negative for activity change and appetite change.  HENT:  Positive for ear pain. Negative for sinus pressure and sore throat.   Respiratory:  Negative for chest tightness, shortness of breath and wheezing.   Cardiovascular:  Negative for chest pain and palpitations.  Gastrointestinal:  Negative for abdominal distention, abdominal pain and constipation.  Genitourinary: Negative.   Musculoskeletal:        See HPI  Psychiatric/Behavioral:  Negative for behavioral problems and dysphoric mood.     Objective:  BP 125/75   Pulse 74   Ht 5\' 2"  (1.575 m)   Wt 157 lb (71.2 kg)   SpO2 100%   BMI 28.72 kg/m      04/30/2023    2:30 PM 04/23/2023   10:55 AM 04/10/2023    1:11 AM  BP/Weight  Systolic BP 125 133 129  Diastolic BP 75 68 61  Wt. (Lbs) 157 159   BMI 28.72 kg/m2 29.08 kg/m2       Physical Exam Constitutional:      Appearance:  She is well-developed.  HENT:     Head:     Comments: Whitish debris and accumulation of dried whitish substance in left ear    Right Ear: There is impacted cerumen.  Cardiovascular:     Rate and Rhythm: Normal rate.     Heart sounds: Normal heart sounds. No murmur heard. Pulmonary:     Effort: Pulmonary effort is normal.     Breath sounds: Normal breath sounds. No wheezing or rales.  Chest:  Chest wall: No tenderness.  Abdominal:     General: Bowel sounds are normal. There is no distension.     Palpations: Abdomen is soft. There is no mass.     Tenderness: There is no abdominal tenderness.  Musculoskeletal:     Right lower leg: No edema.     Left lower leg: No edema.     Comments: Tenderness to palpation of left biceps muscle Normal range of motion of left upper extremity, normal handgrip Left knee with healed vertical surgical scar no tenderness on range of motion, no edema.  Neurological:     Mental Status: She is alert and oriented to person, place, and time.  Psychiatric:        Mood and Affect: Mood normal.        Latest Ref Rng & Units 04/10/2023    8:56 AM 04/09/2023   12:11 AM 12/14/2022    9:24 AM  CMP  Glucose 70 - 99 mg/dL 91  161  096   BUN 8 - 23 mg/dL 11  8  8    Creatinine 0.44 - 1.00 mg/dL 0.45  4.09  8.11   Sodium 135 - 145 mmol/L 136  139  140   Potassium 3.5 - 5.1 mmol/L 4.2  3.8  4.9   Chloride 98 - 111 mmol/L 108  105  108   CO2 22 - 32 mmol/L 19  22  25    Calcium 8.9 - 10.3 mg/dL 9.1  9.0  9.2   Total Protein 6.5 - 8.1 g/dL   6.7   Total Bilirubin 0.3 - 1.2 mg/dL   1.2   Alkaline Phos 38 - 126 U/L   48   AST 15 - 41 U/L   22   ALT 0 - 44 U/L   16     Lipid Panel     Component Value Date/Time   CHOL 152 09/28/2022 0525   TRIG 40 09/28/2022 0525   HDL 68 09/28/2022 0525   CHOLHDL 2.2 09/28/2022 0525   VLDL 8 09/28/2022 0525   LDLCALC 76 09/28/2022 0525    CBC    Component Value Date/Time   WBC 8.2 04/10/2023 0856   RBC 4.00  04/10/2023 0856   HGB 12.2 04/10/2023 0856   HCT 37.4 04/10/2023 0856   PLT 270 04/10/2023 0856   MCV 93.5 04/10/2023 0856   MCH 30.5 04/10/2023 0856   MCHC 32.6 04/10/2023 0856   RDW 11.7 04/10/2023 0856   LYMPHSABS 2.1 04/09/2023 0011   MONOABS 1.2 (H) 04/09/2023 0011   EOSABS 0.0 04/09/2023 0011   BASOSABS 0.0 04/09/2023 0011    Lab Results  Component Value Date   HGBA1C 4.7 (L) 09/28/2022    Assessment & Plan:  1. Primary osteoarthritis of left knee S/p post total knee replacement She states she is not open to taking medications Advised to use topical NSAIDs PT will be beneficial - Ambulatory referral to Physical Therapy  2. TIA (transient ischemic attack) No residual symptoms Secondary prevention Continue statin and Plavix  3. Coronary artery disease involving native coronary artery of native heart without angina pectoris Stable, asymptomatic Continue Plavix Continue to follow-up with cardiology  4. Pure hypercholesterolemia Controlled Continue statin Low-cholesterol diet  5. Left arm pain She does have some form of biceps tendinitis Corticosteroid injections may be beneficial but she declines this Will start with physical therapy - Ambulatory referral to Physical Therapy  6. 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  Dispense: 7.5 mL; Refill: 0  7. Impacted cerumen of right ear Ear irrigation performed    Meds ordered this encounter  Medications   ciprofloxacin-dexamethasone (CIPRODEX) OTIC suspension    Sig: Place 4 drops into the left ear 2 (two) times daily.    Dispense:  7.5 mL    Refill:  0    Follow-up: Return in about 6 months (around 10/31/2023) for Chronic medical conditions.       Hoy Register, MD, FAAFP. [facility]  [place], [place] [phone]   04/30/2023, 4:35 PM

## 2023-05-01 ENCOUNTER — Telehealth: Payer: Self-pay | Admitting: Pharmacist

## 2023-05-01 NOTE — Telephone Encounter (Signed)
Ciprodex is not covered by patient's insurance. It looks like Cortisporin Otic or Ofloxacin otic are covered alternatives. Routing to PCP for approval if appropriate.

## 2023-05-02 MED ORDER — OFLOXACIN 0.3 % OT SOLN: 5.0000 [drp] | Freq: Two times a day (BID) | OTIC | 0 refills | Status: DC

## 2023-05-02 NOTE — Addendum Note (Signed)
Addended by: Hoy Register on: 05/02/2023 08:08 AM   Modules accepted: Orders

## 2023-05-02 NOTE — Telephone Encounter (Signed)
Changed to Ofloxacin. 

## 2023-05-04 DIAGNOSIS — I2510 Atherosclerotic heart disease of native coronary artery without angina pectoris: Secondary | ICD-10-CM | POA: Diagnosis not present

## 2023-05-04 DIAGNOSIS — E559 Vitamin D deficiency, unspecified: Secondary | ICD-10-CM | POA: Diagnosis not present

## 2023-05-04 DIAGNOSIS — E782 Mixed hyperlipidemia: Secondary | ICD-10-CM | POA: Diagnosis not present

## 2023-05-04 DIAGNOSIS — I10 Essential (primary) hypertension: Secondary | ICD-10-CM | POA: Diagnosis not present

## 2023-05-26 ENCOUNTER — Ambulatory Visit: Payer: Medicare Other | Attending: Internal Medicine

## 2023-05-26 ENCOUNTER — Other Ambulatory Visit: Payer: Self-pay

## 2023-05-26 DIAGNOSIS — G8929 Other chronic pain: Secondary | ICD-10-CM | POA: Insufficient documentation

## 2023-05-26 DIAGNOSIS — M25562 Pain in left knee: Secondary | ICD-10-CM | POA: Diagnosis not present

## 2023-05-26 DIAGNOSIS — M1712 Unilateral primary osteoarthritis, left knee: Secondary | ICD-10-CM | POA: Insufficient documentation

## 2023-05-26 NOTE — Therapy (Signed)
OUTPATIENT PHYSICAL THERAPY LOWER EXTREMITY EVALUATION   Patient Name: Nancy Vasquez MRN: [id number] DOB:Jul 27, 1941, 82 y.o., female Today's Date: 05/28/2023  END OF SESSION:  PT End of Session - 05/28/23 1101     Visit Number 1    Number of Visits 3    Date for PT Re-Evaluation 07/25/23    Authorization Type BCBS    PT Start Time 1538    PT Stop Time 1625    PT Time Calculation (min) 47 min    Activity Tolerance Patient tolerated treatment well    Behavior During Therapy [facility] for tasks assessed/performed             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   Folliculitis 04/10/2023   Past Surgical History:  Procedure Laterality Date   ABDOMINAL HYSTERECTOMY     BREAST EXCISIONAL BIOPSY Left    CORONARY PRESSURE/FFR STUDY N/A 03/17/2017   Procedure: Intravascular Pressure Wire/FFR Study;  Surgeon: Rinaldo Cloud, MD;  Location: [facility] INVASIVE CV LAB;  Service: Cardiovascular;  Laterality: N/A;  mid LAD   LEFT HEART CATH AND CORONARY ANGIOGRAPHY N/A 03/17/2017   Procedure: Left Heart Cath and Coronary Angiography;  Surgeon: Rinaldo Cloud, MD;  Location: [facility] INVASIVE CV LAB;  Service: Cardiovascular;  Laterality: N/A;   TONSILLECTOMY     Removed as a child   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   Procedure: LEFT TOTAL KNEE ARTHROPLASTY;  Surgeon: Cammy Copa, MD;  Location: [facility] OR;  Service: Orthopedics;  Laterality: Left;   Patient Active Problem List   Diagnosis Date Noted   Candida vaginitis 04/23/2023   Cellulitis 04/09/2023   TIA (transient ischemic attack) 03/17/2023   HLD (hyperlipidemia) 11/06/2022   Coronary artery disease 09/27/2022   Osteoarthritis of left knee 04/19/2020    PCP: Macy Mis, MD  REFERRING PROVIDER: Mercie Eon, MD  REFERRING DIAG: Primary osteoarthritis of left knee [M17.12]   THERAPY DIAG:  Chronic pain of  left knee  Rationale for Evaluation and Treatment: Rehabilitation  ONSET DATE: 3+ years   SUBJECTIVE:   SUBJECTIVE STATEMENT: Patient reports that she has been having pain in her L knee for the past 3 years following L TKA. She had been seen for rehab and remembers that her mobility improved but she had some lingering pain when discharged from PT. She endorses poor-to-fair compliance with prescribed home program following discharge and has been come less active in the past few years.   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 Yes: NPRS scale: 5/10 Pain location: L knee Pain description: burning, stiffness  PRECAUTIONS: None  WEIGHT BEARING RESTRICTIONS: No  FALLS:  Has patient fallen in last 6 months? No  LIVING ENVIRONMENT: Lives with: lives with their family Stairs: Yes x 6 with railing  Has following equipment at home: Single point cane  OCCUPATION: Retired   PLOF: Independent  PATIENT GOALS: Patient would like to be able to cross her L leg over to lotion her feet.    OBJECTIVE:   DIAGNOSTIC FINDINGS:  01/20/22 XR Knee 1-2 Views Left  AP lateral radiographs left knee reviewed.  Press-fit total knee prosthesis posterior cruciate retaining in good position alignment  with no lucencies around the bone prosthetic interface. Alignment intact.  Patella height normal relative to distal femur.  No acute fracture.   PATIENT SURVEYS:  FOTO 78 current, 75 predicted  COGNITION: Overall cognitive status: Within functional limits for tasks assessed     SENSATION: Not tested  POSTURE: No Significant postural limitations  LOWER EXTREMITY ROM:  Active ROM Left eval  Knee flexion 110  Knee extension 0   (Blank rows = not tested)  LOWER EXTREMITY MMT: L LE grossly 4+/5 MMT  FUNCTIONAL TESTS:  Not  Tested  GAIT: Assistive device utilized: Single point cane Comments: slow gait speed, antalgic gait, with decreased L stance phase compared to R stance phase   TODAY'S TREATMENT:                                                                                                                               [facility] Adult PT Treatment:                                                DATE: 05/26/2023  Therapeutic Activity: Patient education as noted below   PATIENT EDUCATION:  Education details: reviewed and provided initial home program, discussed importance of return to regular walking for symptom management Person educated: Patient Education method: Explanation, Demonstration, and Handouts Education comprehension: verbalized understanding, returned demonstration, and needs further education  HOME EXERCISE PROGRAM: Access Code: [email or web address] URL: [email or web address] Date: 05/26/2023 Prepared by: Mauri Reading  Program Notes PLEASE return to light or brisk walking 20-30 minutes/day.   Exercises - Seated Heel Slide  - 1 x daily - 4 x weekly - 2 sets - 10 reps - 3 sec hold - Standing Knee Flexion Stretch on Step  - 1 x daily - 4 x weekly - 2 sets - 10 reps - 3 sec hold - Prone Knee Flexion AROM  - 1 x daily - 4 x weekly - 2 sets - 10 reps - 3 sec hold  ASSESSMENT:  CLINICAL IMPRESSION: Patient is a 82 y.o. female who was seen today for physical therapy evaluation and treatment for persistent L knee pain. She is demonstrating decreased L knee AROM and altered gait mechanics. She has related difficulty with standing and walking tolerance as relevant to normal household activities and prior level of physical activities. She will benefit from at least 3 sessions of skilled PT intervention, including patient education for improved overall function and compliance with home program to improved OA related pain.     OBJECTIVE IMPAIRMENTS: decreased activity tolerance, decreased ROM, and  pain.   ACTIVITY LIMITATIONS: locomotion level  PARTICIPATION LIMITATIONS: driving and recreational activities, normal physical activites  PERSONAL FACTORS: Age, Time since onset of injury/illness/exacerbation, and 3+ comorbidities: HLD, TIA, OA, CAD  are also affecting patient's functional outcome.   REHAB POTENTIAL:  Fair    CLINICAL DECISION MAKING: Stable/uncomplicated  EVALUATION COMPLEXITY: Low   GOALS: Goals reviewed with patient? Yes  SHORT TERM GOALS: Target date: 06/27/2023   Patient will report compliance with prescribed home exercise program, including light-to-brisk walking at least 3x/week for pain modulation benefits.  Baseline: provided and reviewed at evla  Goal status: INITIAL    LONG TERM GOALS: Target date: 07/25/2023   Patient will report 3/10 or less pain on average with her normal daily activities Baseline: 5/10 or greater  Goal status: INITIAL  2.  Patient will report ability to perform lower body dressing, including applying lotion to her left foot with no more than minimal pain/difficulty.  Baseline: unable to perform without assistance  Goal status: INITIAL  3.  Patient will demonstrate understanding of updated home program and correct performance of LE strengthening and knee flexion ROM activities in order to be independent with exercises upon discharged from skilled PT care.  Baseline: home program will be updated at second visit.  Goal status: INITIAL   PLAN:  PT FREQUENCY: 1x/month   PT DURATION: 2 months   PLANNED INTERVENTIONS: Therapeutic exercises, Therapeutic activity, Neuromuscular re-education, Patient/Family education, Self Care, Joint mobilization, Aquatic Therapy, Dry Needling, Electrical stimulation, Cryotherapy, Moist heat, Taping, Manual therapy, and Re-evaluation  PLAN FOR NEXT SESSION: review home exercise program and progress to address L knee AROM, LE strength, and pain modulation.     Mauri Reading, PT, DPT   05/28/2023, 11:43 AM

## 2023-06-18 DIAGNOSIS — K080 Exfoliation of teeth due to systemic causes: Secondary | ICD-10-CM | POA: Diagnosis not present

## 2023-07-08 ENCOUNTER — Telehealth: Payer: Medicare Other | Admitting: Physician Assistant

## 2023-07-08 ENCOUNTER — Encounter: Payer: Self-pay | Admitting: Physician Assistant

## 2023-07-08 DIAGNOSIS — M25562 Pain in left knee: Secondary | ICD-10-CM | POA: Diagnosis not present

## 2023-07-08 MED ORDER — TRAMADOL HCL 50 MG PO TABS
50.0000 mg | ORAL_TABLET | Freq: Three times a day (TID) | ORAL | 0 refills | Status: DC | PRN
Start: 2023-07-08 — End: 2023-07-11

## 2023-07-08 NOTE — Patient Instructions (Signed)
Nancy Vasquez, thank you for joining Roney Jaffe, PA-C for today's virtual visit.  While this provider is not your primary care provider (PCP), if your PCP is located in our provider database this encounter information will be shared with them immediately following your visit.   A [facility] MyChart account gives you access to today's visit and all your visits, tests, and labs performed at [facility] " click here if you don't have a [facility] MyChart account or go to [email or web address]  Consent: (Patient) Nancy Vasquez provided verbal consent for this virtual visit at the beginning of the encounter.  Current Medications:  Current Outpatient Medications:    traMADol (ULTRAM) 50 MG tablet, Take 1 tablet (50 mg total) by mouth every 8 (eight) hours as needed for up to 5 days., Disp: 7 tablet, Rfl: 0   atorvastatin (LIPITOR) 40 MG tablet, Take 1 tablet (40 mg total) by mouth daily., Disp: 90 tablet, Rfl: 1   clopidogrel (PLAVIX) 75 MG tablet, Take 1 tablet (75 mg total) by mouth daily., Disp: 90 tablet, Rfl: 1   ofloxacin (FLOXIN) 0.3 % OTIC solution, Place 5 drops into the left ear 2 (two) times daily. (Patient not taking: Reported on 05/26/2023), Disp: 5 mL, Rfl: 0   Vitamin D, Ergocalciferol, (DRISDOL) 1.25 MG (50000 UNIT) CAPS capsule, Take 50,000 Units by mouth once a week., Disp: , Rfl:    Medications ordered in this encounter:  Meds ordered this encounter  Medications   traMADol (ULTRAM) 50 MG tablet    Sig: Take 1 tablet (50 mg total) by mouth every 8 (eight) hours as needed for up to 5 days.    Dispense:  7 tablet    Refill:  0    Order Specific Question:   Supervising Provider    Answer:   Quentin Angst [id number]     *If you need refills on other medications prior to your next appointment, please contact your pharmacy*  Follow-Up: Call back or seek an in-person evaluation if the symptoms worsen or if the condition fails to improve as  anticipated.   Virtual Care [phone]  Other Instructions I strongly encourage you to follow-up with physical therapy as soon as possible. Acute Knee Pain, Adult Acute knee pain is sudden and may be caused by damage, swelling, or irritation of the muscles and tissues that support the knee. Pain may result from: A fall. An injury to the knee from twisting motions. A hit to the knee. Infection. Acute knee pain may go away on its own with time and rest. If it does not, your health care provider may order tests to find the cause of the pain. These may include: Imaging tests, such as an X-ray, MRI, CT scan, or ultrasound. Joint aspiration. In this test, fluid is removed from the knee and evaluated. Arthroscopy. In this test, a lighted tube is inserted into the knee and an image is projected onto a TV screen. Biopsy. In this test, a sample of tissue is removed from the body and studied under a microscope. Follow these instructions at home: If you have a knee sleeve or brace:  Wear the knee sleeve or brace as told by your health care provider. Remove it only as told by your health care provider. Loosen it if your toes tingle, become numb, or turn cold and blue. Keep it clean. If the knee sleeve or brace is not waterproof: Do not let it get wet. Cover it with a  watertight covering when you take a bath or shower. Activity Rest your knee. Do not do things that cause pain or make pain worse. Avoid high-impact activities or exercises, such as running, jumping rope, or doing jumping jacks. Work with a physical therapist to make a safe exercise program, as recommended by your health care provider. Do exercises as told by your physical therapist. Managing pain, stiffness, and swelling  If directed, put ice on the affected knee. To do this: If you have a removable knee sleeve or brace, remove it as told by your health care provider. Put ice in a plastic bag. Place a towel between  your skin and the bag. Leave the ice on for 20 minutes, 2-3 times a day. Remove the ice if your skin turns bright red. This is very important. If you cannot feel pain, heat, or cold, you have a greater risk of damage to the area. If directed, use an elastic bandage to put pressure (compression) on your injured knee. This may control swelling, give support, and help with discomfort. Raise (elevate) your knee above the level of your heart while you are sitting or lying down. Sleep with a pillow under your knee. General instructions Take over-the-counter and prescription medicines only as told by your health care provider. Do not use any products that contain nicotine or tobacco, such as cigarettes, e-cigarettes, and chewing tobacco. If you need help quitting, ask your health care provider. If you are overweight, work with your health care provider and a dietitian to set a weight-loss goal that is healthy and reasonable for you. Extra weight can put pressure on your knee. Pay attention to any changes in your symptoms. Keep all follow-up visits. This is important. Contact a health care provider if: Your knee pain continues, changes, or gets worse. You have a fever along with knee pain. Your knee feels warm to the touch or is red. Your knee buckles or locks up. Get help right away if: Your knee swells, and the swelling becomes worse. You cannot move your knee. You have severe pain in your knee that cannot be managed with pain medicine. Summary Acute knee pain can be caused by a fall, an injury, an infection, or damage, swelling, or irritation of the tissues that support your knee. Your health care provider may perform tests to find out the cause of the pain. Pay attention to any changes in your symptoms. Relieve your pain with rest, medicines, light activity, and the use of ice. Get help right away if your knee swells, you cannot move your knee, or you have severe pain that cannot be managed with  medicine. This information is not intended to replace advice given to you by your health care provider. Make sure you discuss any questions you have with your health care provider. Document Revised: 05/29/2020 Document Reviewed: 05/30/2020 Elsevier Patient Education  2024 [employer].

## 2023-07-08 NOTE — Progress Notes (Signed)
Established Patient Office Visit  Subjective   Patient ID: Nancy Vasquez, female    DOB: 1941-04-09  Age: 82 y.o. MRN: [id number]  Chief Complaint  Patient presents with   left knee pain   Virtual Visit via Video Note  I connected with [name] on 07/08/23 at  4:00 PM EDT by a video enabled telemedicine application and verified that I am speaking with the correct person using two identifiers.  Several attempts made to complete visit through video, patient unable to connect to audio.  Remainder of visit was completed via telephone.  Location: Patient: Home  Provider: [facility]    I discussed the limitations of evaluation and management by telemedicine and the availability of in person appointments. The patient expressed understanding and agreed to proceed.  History of Present Illness: States that she has a significant history of left knee surgery approximately 3 years ago and states that she has continued to have pain in her left knee since her surgery.  States that she went to the beach a couple of days ago, states that she since then has had increased pain in her knee.  Describes it as a burning sensation.  States that she can "barely walk" states that she does have a little bit of swelling but describes that as normal.  States that she did not have any injury or trauma to it while at the beach.  States that she has tried rubbing Vicks, using Tylenol, ice, and sitting in a [place] without relief.    Did not schedule any physical therapy sessions after her initial evaluation, states that she did not know if it was going to be helpful.   Observations/Objective: Medical history and current medications reviewed, no physical exam completed     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   Folliculitis 04/10/2023   Social History    Socioeconomic History   Marital status: Married    Spouse name: Not on file   Number of children: Not on file   Years of education: Not on file   Highest education level: Not on file  Occupational History   Not on file  Tobacco Use   Smoking status: Never   Smokeless tobacco: Never  Substance and Sexual Activity   Alcohol use: Never   Drug use: Never   Sexual activity: Not on file  Other Topics Concern   Not on file  Social History Narrative   Not on file   Social Determinants of Health   Financial Resource Strain: Low Risk  (04/23/2023)   Overall Financial Resource Strain (CARDIA)    Difficulty of Paying Living Expenses: Not hard at all  Food Insecurity: No Food Insecurity (04/23/2023)   Hunger Vital Sign    Worried About Running Out of Food in the Last Year: Never true    Ran Out of Food in the Last Year: Never true  Transportation Needs: No Transportation Needs (04/23/2023)   PRAPARE - Administrator, Civil Service (Medical): No    Lack of Transportation (Non-Medical): No  Physical Activity: Insufficiently Active (04/23/2023)   Exercise Vital Sign    Days of Exercise per Week: 7 days    Minutes of Exercise per Session: 10 min  Stress: No Stress Concern Present (04/23/2023)   [employer] of Occupational Health - Occupational Stress Questionnaire  Feeling of Stress : Not at all  Social Connections: Socially Integrated (04/23/2023)   Social Connection and Isolation Panel [NHANES]    Frequency of Communication with Friends and Family: More than three times a week    Frequency of Social Gatherings with Friends and Family: More than three times a week    Attends Religious Services: More than 4 times per year    Active Member of [employer] or Organizations: Yes    Attends Engineer, structural: More than 4 times per year    Marital Status: Married  Catering manager Violence: Not At Risk (04/23/2023)   Humiliation, Afraid, Rape, and Kick questionnaire     Fear of Current or Ex-Partner: No    Emotionally Abused: No    Physically Abused: No    Sexually Abused: No   Family History  Problem Relation Age of Onset   Breast cancer Sister 80       metastatic breast ca   Allergies  Allergen Reactions   Ibuprofen Nausea And Vomiting and Other (See Comments)    "burns stomach"   Oxycodone Nausea Only    "Burns stomach"    Review of Systems  Constitutional:  Negative for chills and fever.  HENT: Negative.    Eyes: Negative.   Respiratory:  Negative for shortness of breath.   Cardiovascular:  Negative for chest pain.  Gastrointestinal: Negative.   Genitourinary: Negative.   Musculoskeletal:  Positive for joint pain.  Skin: Negative.   Neurological: Negative.   Endo/Heme/Allergies: Negative.   Psychiatric/Behavioral: Negative.         Assessment & Plan:   Problem List Items Addressed This Visit   None Visit Diagnoses     Acute pain of left knee    -  Primary   Relevant Medications   traMADol (ULTRAM) 50 MG tablet      Assessment and Plan: 1. Acute pain of left knee Trial of tramadol for severe pain.  Check of [place] controlled substance registry appropriate.  Patient education given on supportive care, red flags given for prompt reevaluation.  Patient to follow-up with primary care provider in 6 days.  Patient strongly encouraged to follow-up with physical therapy.  Patient understands and agrees. - traMADol (ULTRAM) 50 MG tablet; Take 1 tablet (50 mg total) by mouth every 8 (eight) hours as needed for up to 5 days.  Dispense: 7 tablet; Refill: 0   Follow Up Instructions:    I discussed the assessment and treatment plan with the patient. The patient was provided an opportunity to ask questions and all were answered. The patient agreed with the plan and demonstrated an understanding of the instructions.   The patient was advised to call back or seek an in-person evaluation if the symptoms worsen or if the condition  fails to improve as anticipated.  I provided 20 minutes of non-face-to-face time during this encounter.    Return if symptoms worsen or fail to improve.    Kasandra Knudsen Mayers, PA-C

## 2023-07-09 ENCOUNTER — Other Ambulatory Visit: Payer: Self-pay

## 2023-07-09 ENCOUNTER — Encounter (HOSPITAL_COMMUNITY): Payer: Self-pay | Admitting: Family Medicine

## 2023-07-09 ENCOUNTER — Emergency Department (HOSPITAL_COMMUNITY): Payer: Medicare Other

## 2023-07-09 ENCOUNTER — Inpatient Hospital Stay (HOSPITAL_COMMUNITY)
Admission: EM | Admit: 2023-07-09 | Discharge: 2023-07-11 | DRG: 871 | Disposition: A | Discharge status: Home / Self Care | Payer: Medicare Other | Attending: Internal Medicine | Admitting: Internal Medicine

## 2023-07-09 DIAGNOSIS — M25562 Pain in left knee: Secondary | ICD-10-CM | POA: Diagnosis present

## 2023-07-09 DIAGNOSIS — Z803 Family history of malignant neoplasm of breast: Secondary | ICD-10-CM

## 2023-07-09 DIAGNOSIS — E785 Hyperlipidemia, unspecified: Secondary | ICD-10-CM | POA: Diagnosis not present

## 2023-07-09 DIAGNOSIS — R4182 Altered mental status, unspecified: Secondary | ICD-10-CM | POA: Diagnosis not present

## 2023-07-09 DIAGNOSIS — K118 Other diseases of salivary glands: Secondary | ICD-10-CM | POA: Diagnosis not present

## 2023-07-09 DIAGNOSIS — R9431 Abnormal electrocardiogram [ECG] [EKG]: Secondary | ICD-10-CM | POA: Diagnosis not present

## 2023-07-09 DIAGNOSIS — Z8673 Personal history of transient ischemic attack (TIA), and cerebral infarction without residual deficits: Secondary | ICD-10-CM | POA: Diagnosis not present

## 2023-07-09 DIAGNOSIS — I2510 Atherosclerotic heart disease of native coronary artery without angina pectoris: Secondary | ICD-10-CM | POA: Diagnosis present

## 2023-07-09 DIAGNOSIS — R509 Fever, unspecified: Secondary | ICD-10-CM

## 2023-07-09 DIAGNOSIS — G9340 Encephalopathy, unspecified: Secondary | ICD-10-CM | POA: Diagnosis not present

## 2023-07-09 DIAGNOSIS — R531 Weakness: Secondary | ICD-10-CM | POA: Diagnosis not present

## 2023-07-09 DIAGNOSIS — I493 Ventricular premature depolarization: Secondary | ICD-10-CM | POA: Diagnosis present

## 2023-07-09 DIAGNOSIS — J189 Pneumonia, unspecified organism: Secondary | ICD-10-CM | POA: Diagnosis not present

## 2023-07-09 DIAGNOSIS — Z886 Allergy status to analgesic agent status: Secondary | ICD-10-CM

## 2023-07-09 DIAGNOSIS — R410 Disorientation, unspecified: Secondary | ICD-10-CM | POA: Diagnosis not present

## 2023-07-09 DIAGNOSIS — Z96641 Presence of right artificial hip joint: Secondary | ICD-10-CM | POA: Diagnosis not present

## 2023-07-09 DIAGNOSIS — I700 Atherosclerosis of aorta: Secondary | ICD-10-CM | POA: Diagnosis not present

## 2023-07-09 DIAGNOSIS — Z1152 Encounter for screening for COVID-19: Secondary | ICD-10-CM | POA: Diagnosis not present

## 2023-07-09 DIAGNOSIS — Z885 Allergy status to narcotic agent status: Secondary | ICD-10-CM

## 2023-07-09 DIAGNOSIS — A419 Sepsis, unspecified organism: Principal | ICD-10-CM | POA: Diagnosis present

## 2023-07-09 DIAGNOSIS — R918 Other nonspecific abnormal finding of lung field: Secondary | ICD-10-CM | POA: Diagnosis not present

## 2023-07-09 DIAGNOSIS — Z96652 Presence of left artificial knee joint: Secondary | ICD-10-CM | POA: Diagnosis present

## 2023-07-09 DIAGNOSIS — Z7902 Long term (current) use of antithrombotics/antiplatelets: Secondary | ICD-10-CM | POA: Diagnosis not present

## 2023-07-09 DIAGNOSIS — Z79899 Other long term (current) drug therapy: Secondary | ICD-10-CM | POA: Diagnosis not present

## 2023-07-09 DIAGNOSIS — G459 Transient cerebral ischemic attack, unspecified: Secondary | ICD-10-CM | POA: Diagnosis not present

## 2023-07-09 DIAGNOSIS — R9089 Other abnormal findings on diagnostic imaging of central nervous system: Secondary | ICD-10-CM | POA: Diagnosis not present

## 2023-07-09 DIAGNOSIS — Z66 Do not resuscitate: Secondary | ICD-10-CM | POA: Diagnosis not present

## 2023-07-09 DIAGNOSIS — J984 Other disorders of lung: Secondary | ICD-10-CM | POA: Diagnosis not present

## 2023-07-09 DIAGNOSIS — R6520 Severe sepsis without septic shock: Secondary | ICD-10-CM | POA: Diagnosis present

## 2023-07-09 DIAGNOSIS — G8929 Other chronic pain: Secondary | ICD-10-CM | POA: Diagnosis present

## 2023-07-09 DIAGNOSIS — H9202 Otalgia, left ear: Secondary | ICD-10-CM | POA: Diagnosis present

## 2023-07-09 DIAGNOSIS — G9341 Metabolic encephalopathy: Secondary | ICD-10-CM | POA: Diagnosis present

## 2023-07-09 DIAGNOSIS — I6782 Cerebral ischemia: Secondary | ICD-10-CM | POA: Diagnosis not present

## 2023-07-09 DIAGNOSIS — R32 Unspecified urinary incontinence: Secondary | ICD-10-CM | POA: Diagnosis present

## 2023-07-09 DIAGNOSIS — N390 Urinary tract infection, site not specified: Secondary | ICD-10-CM | POA: Diagnosis not present

## 2023-07-09 LAB — URINALYSIS, W/ REFLEX TO CULTURE (INFECTION SUSPECTED)
Bacteria, UA: NONE SEEN
Bilirubin Urine: NEGATIVE
Glucose, UA: NEGATIVE mg/dL
Ketones, ur: NEGATIVE mg/dL
Leukocytes,Ua: NEGATIVE
Nitrite: NEGATIVE
Protein, ur: NEGATIVE mg/dL
Specific Gravity, Urine: 1.014 (ref 1.005–1.030)
pH: 5 (ref 5.0–8.0)

## 2023-07-09 LAB — COMPREHENSIVE METABOLIC PANEL
ALT: 15 U/L (ref 0–44)
AST: 19 U/L (ref 15–41)
Albumin: 3.7 g/dL (ref 3.5–5.0)
Alkaline Phosphatase: 56 U/L (ref 38–126)
Anion gap: 10 (ref 5–15)
BUN: 10 mg/dL (ref 8–23)
CO2: 23 mmol/L (ref 22–32)
Calcium: 9 mg/dL (ref 8.9–10.3)
Chloride: 103 mmol/L (ref 98–111)
Creatinine, Ser: 1.05 mg/dL — ABNORMAL HIGH (ref 0.44–1.00)
GFR, Estimated: 54 mL/min — ABNORMAL LOW (ref >60)
Glucose, Bld: 110 mg/dL — ABNORMAL HIGH (ref 70–99)
Potassium: 3.5 mmol/L (ref 3.5–5.1)
Sodium: 136 mmol/L (ref 135–145)
Total Bilirubin: 1.6 mg/dL — ABNORMAL HIGH (ref 0.3–1.2)
Total Protein: 8 g/dL (ref 6.5–8.1)

## 2023-07-09 LAB — CBC WITH DIFFERENTIAL/PLATELET
Abs Immature Granulocytes: 0.06 10*3/uL (ref 0.00–0.07)
Basophils Absolute: 0 10*3/uL (ref 0.0–0.1)
Basophils Relative: 0 %
Eosinophils Absolute: 0 10*3/uL (ref 0.0–0.5)
Eosinophils Relative: 0 %
HCT: 39.2 % (ref 36.0–46.0)
Hemoglobin: 12.6 g/dL (ref 12.0–15.0)
Immature Granulocytes: 0 %
Lymphocytes Relative: 7 %
Lymphs Abs: 1.1 10*3/uL (ref 0.7–4.0)
MCH: 29.9 pg (ref 26.0–34.0)
MCHC: 32.1 g/dL (ref 30.0–36.0)
MCV: 92.9 fL (ref 80.0–100.0)
Monocytes Absolute: 1.3 10*3/uL — ABNORMAL HIGH (ref 0.1–1.0)
Monocytes Relative: 8 %
Neutro Abs: 14 10*3/uL — ABNORMAL HIGH (ref 1.7–7.7)
Neutrophils Relative %: 85 %
Platelets: 212 10*3/uL (ref 150–400)
RBC: 4.22 MIL/uL (ref 3.87–5.11)
RDW: 11.9 % (ref 11.5–15.5)
WBC: 16.5 10*3/uL — ABNORMAL HIGH (ref 4.0–10.5)
nRBC: 0 % (ref 0.0–0.2)

## 2023-07-09 LAB — PROCALCITONIN: Procalcitonin: 0.27 ng/mL

## 2023-07-09 LAB — LACTIC ACID, PLASMA: Lactic Acid, Venous: 1.4 mmol/L (ref 0.5–1.9)

## 2023-07-09 LAB — AMMONIA: Ammonia: 12 umol/L (ref 9–35)

## 2023-07-09 LAB — TSH: TSH: 0.801 u[IU]/mL (ref 0.350–4.500)

## 2023-07-09 LAB — SARS CORONAVIRUS 2 BY RT PCR: SARS Coronavirus 2 by RT PCR: NEGATIVE

## 2023-07-09 MED ORDER — SODIUM CHLORIDE 0.9 % IV SOLN
2.0000 g | Freq: Once | INTRAVENOUS | Status: AC
  Administered 2023-07-09: 2 g via INTRAVENOUS
  Filled 2023-07-09: qty 20

## 2023-07-09 MED ORDER — ONDANSETRON HCL 4 MG PO TABS: 4.0000 mg | ORAL_TABLET | Freq: Four times a day (QID) | ORAL | Status: DC | PRN

## 2023-07-09 MED ORDER — ONDANSETRON HCL 4 MG/2ML IJ SOLN
4.0000 mg | Freq: Four times a day (QID) | INTRAMUSCULAR | Status: DC | PRN
  Administered 2023-07-10: 4 mg via INTRAVENOUS
  Filled 2023-07-09: qty 2

## 2023-07-09 MED ORDER — SENNOSIDES-DOCUSATE SODIUM 8.6-50 MG PO TABS: 1.0000 | ORAL_TABLET | Freq: Every evening | ORAL | Status: DC | PRN

## 2023-07-09 MED ORDER — ACETAMINOPHEN 325 MG PO TABS
650.0000 mg | ORAL_TABLET | Freq: Four times a day (QID) | ORAL | Status: DC | PRN
  Administered 2023-07-10 – 2023-07-11 (×3): 650 mg via ORAL
  Filled 2023-07-09 (×4): qty 2

## 2023-07-09 MED ORDER — ATORVASTATIN CALCIUM 40 MG PO TABS
40.0000 mg | ORAL_TABLET | Freq: Every day | ORAL | Status: DC
  Administered 2023-07-10 – 2023-07-11 (×2): 40 mg via ORAL
  Filled 2023-07-09 (×2): qty 1

## 2023-07-09 MED ORDER — SODIUM CHLORIDE 0.9 % IV SOLN
2.0000 g | INTRAVENOUS | Status: DC
  Administered 2023-07-10: 2 g via INTRAVENOUS
  Filled 2023-07-09: qty 20

## 2023-07-09 MED ORDER — ACETAMINOPHEN 650 MG RE SUPP
650.0000 mg | Freq: Four times a day (QID) | RECTAL | Status: DC | PRN
  Administered 2023-07-10: 650 mg via RECTAL
  Filled 2023-07-09: qty 1

## 2023-07-09 MED ORDER — ENOXAPARIN SODIUM 40 MG/0.4ML IJ SOSY
40.0000 mg | PREFILLED_SYRINGE | INTRAMUSCULAR | Status: DC
  Administered 2023-07-09 – 2023-07-10 (×2): 40 mg via SUBCUTANEOUS
  Filled 2023-07-09 (×2): qty 0.4

## 2023-07-09 MED ORDER — SODIUM CHLORIDE 0.9 % IV BOLUS
1000.0000 mL | Freq: Once | INTRAVENOUS | Status: AC
  Administered 2023-07-09: 1000 mL via INTRAVENOUS

## 2023-07-09 MED ORDER — CLOPIDOGREL BISULFATE 75 MG PO TABS
75.0000 mg | ORAL_TABLET | Freq: Every day | ORAL | Status: DC
  Administered 2023-07-10 – 2023-07-11 (×2): 75 mg via ORAL
  Filled 2023-07-09 (×2): qty 1

## 2023-07-09 MED ORDER — SODIUM CHLORIDE 0.9 % IV SOLN
500.0000 mg | Freq: Once | INTRAVENOUS | Status: AC
  Administered 2023-07-09: 500 mg via INTRAVENOUS
  Filled 2023-07-09: qty 5

## 2023-07-09 MED ORDER — SODIUM CHLORIDE 0.9% FLUSH
3.0000 mL | Freq: Two times a day (BID) | INTRAVENOUS | Status: DC
  Administered 2023-07-09 – 2023-07-11 (×4): 3 mL via INTRAVENOUS

## 2023-07-09 MED ORDER — AZITHROMYCIN 500 MG PO TABS
500.0000 mg | ORAL_TABLET | Freq: Every day | ORAL | Status: DC
  Administered 2023-07-10 – 2023-07-11 (×2): 500 mg via ORAL
  Filled 2023-07-09 (×2): qty 1

## 2023-07-09 MED ORDER — ACETAMINOPHEN 500 MG PO TABS
1000.0000 mg | ORAL_TABLET | Freq: Once | ORAL | Status: AC
  Administered 2023-07-09: 1000 mg via ORAL
  Filled 2023-07-09: qty 2

## 2023-07-09 NOTE — Plan of Care (Signed)

## 2023-07-09 NOTE — ED Notes (Signed)
ED TO INPATIENT HANDOFF REPORT  ED Nurse Name and Phone #: Delfin Edis / [phone]  S Name/Age/Gender Nancy Vasquez 82 y.o. female Room/Bed: 029C/029C  Code Status   Code Status: DNR  Home/SNF/Other Home Patient oriented to: self, place, and time Is this baseline? Yes   Triage Complete: Triage complete  Chief Complaint Sepsis (HCC) [A41.9]  Triage Note No notes on file   Allergies Allergies  Allergen Reactions   Ibuprofen Nausea And Vomiting and Other (See Comments)    "burns stomach"   Oxycodone Nausea Only    "Burns stomach"    Level of Care/Admitting Diagnosis ED Disposition     ED Disposition  Admit   Condition  --   Comment  Hospital Area: [facility] [[id number]]  Level of Care: Telemetry Medical [104]  May admit patient to [facility] or [facility] if equivalent level of care is available:: Yes  Covid Evaluation: Asymptomatic - no recent exposure (last 10 days) testing not required  Diagnosis: Sepsis [facility]) [[id number]]  Admitting Physician: Briscoe Deutscher [[id number]]  Attending Physician: Briscoe Deutscher [[id number]]  Certification:: I certify this patient will need inpatient services for at least 2 midnights  Estimated Length of Stay: 3          B Medical/Surgery History 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   Folliculitis 04/10/2023   Past Surgical History:  Procedure Laterality Date   ABDOMINAL HYSTERECTOMY     BREAST EXCISIONAL BIOPSY Left    CORONARY PRESSURE/FFR STUDY N/A 03/17/2017   Procedure: Intravascular Pressure Wire/FFR Study;  Surgeon: Rinaldo Cloud, MD;  Location: [facility] INVASIVE CV LAB;  Service: Cardiovascular;  Laterality: N/A;  mid LAD   LEFT HEART CATH AND CORONARY ANGIOGRAPHY N/A 03/17/2017   Procedure: Left Heart Cath and Coronary Angiography;  Surgeon: Rinaldo Cloud, MD;  Location: [facility] INVASIVE  CV LAB;  Service: Cardiovascular;  Laterality: N/A;   TONSILLECTOMY     Removed as a child   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   Procedure: LEFT TOTAL KNEE ARTHROPLASTY;  Surgeon: Cammy Copa, MD;  Location: [facility] OR;  Service: Orthopedics;  Laterality: Left;     A IV Location/Drains/Wounds Patient Lines/Drains/Airways Status     Active Line/Drains/Airways     Name Placement date Placement time Site Days   Peripheral IV 07/09/23 20 G Anterior;Left Forearm 07/09/23  --  Forearm  less than 1   Peripheral IV 07/09/23 20 G Right Antecubital 07/09/23  1700  Antecubital  less than 1   Wound / Incision (Open or Dehisced) 04/09/23 Other (Comment) Labia Right 04/09/23  --  Labia  91            Intake/Output Last 24 hours No intake or output data in the 24 hours ending 07/09/23 1927  Labs/Imaging Results for orders placed or performed during the hospital encounter of 07/09/23 (from the past 48 hour(s))  Comprehensive metabolic panel     Status: Abnormal   Collection Time: 07/09/23  4:53 PM  Result Value Ref Range   Sodium 136 135 - 145 mmol/L   Potassium 3.5 3.5 - 5.1 mmol/L   Chloride 103 98 - 111 mmol/L   CO2 23 22 - 32 mmol/L   Glucose, Bld 110 (H) 70 - 99 mg/dL    Comment: Glucose reference range applies only to samples taken after fasting for at  least 8 hours.   BUN 10 8 - 23 mg/dL   Creatinine, Ser 1.61 (H) 0.44 - 1.00 mg/dL   Calcium 9.0 8.9 - 09.6 mg/dL   Total Protein 8.0 6.5 - 8.1 g/dL   Albumin 3.7 3.5 - 5.0 g/dL   AST 19 15 - 41 U/L   ALT 15 0 - 44 U/L   Alkaline Phosphatase 56 38 - 126 U/L   Total Bilirubin 1.6 (H) 0.3 - 1.2 mg/dL   GFR, Estimated 54 (L) >60 mL/min    Comment: (NOTE) Calculated using the CKD-EPI Creatinine Equation (2021)    Anion gap 10 5 - 15    Comment: Performed at [facility], [address] [place], [place] [address]  CBC with Differential     Status: Abnormal   Collection Time: 07/09/23  4:53 PM  Result Value Ref  Range   WBC 16.5 (H) 4.0 - 10.5 K/uL   RBC 4.22 3.87 - 5.11 MIL/uL   Hemoglobin 12.6 12.0 - 15.0 g/dL   HCT 98.1 19.1 - 47.8 %   MCV 92.9 80.0 - 100.0 fL   MCH 29.9 26.0 - 34.0 pg   MCHC 32.1 30.0 - 36.0 g/dL   RDW 29.5 62.1 - 30.8 %   Platelets 212 150 - 400 K/uL   nRBC 0.0 0.0 - 0.2 %   Neutrophils Relative % 85 %   Neutro Abs 14.0 (H) 1.7 - 7.7 K/uL   Lymphocytes Relative 7 %   Lymphs Abs 1.1 0.7 - 4.0 K/uL   Monocytes Relative 8 %   Monocytes Absolute 1.3 (H) 0.1 - 1.0 K/uL   Eosinophils Relative 0 %   Eosinophils Absolute 0.0 0.0 - 0.5 K/uL   Basophils Relative 0 %   Basophils Absolute 0.0 0.0 - 0.1 K/uL   Immature Granulocytes 0 %   Abs Immature Granulocytes 0.06 0.00 - 0.07 K/uL    Comment: Performed at [facility], [address], [place], [place] [address]  TSH     Status: None   Collection Time: 07/09/23  4:53 PM  Result Value Ref Range   TSH 0.801 0.350 - 4.500 uIU/mL    Comment: Performed by a 3rd Generation assay with a functional sensitivity of <=0.01 uIU/mL. Performed at [facility] Lab, [address] [place], [place] [address]   Ammonia     Status: None   Collection Time: 07/09/23  4:53 PM  Result Value Ref Range   Ammonia 12 9 - 35 umol/L    Comment: Performed at [facility], [address] [place], [place] [address]  Lactic acid, plasma     Status: None   Collection Time: 07/09/23  4:53 PM  Result Value Ref Range   Lactic Acid, Venous 1.4 0.5 - 1.9 mmol/L    Comment: Performed at [facility], [address], [place], [place] [address]  Urinalysis, w/ Reflex to Culture (Infection Suspected) -Urine, Clean Catch     Status: Abnormal   Collection Time: 07/09/23  5:46 PM  Result Value Ref Range   Specimen Source URINE, CLEAN CATCH    Color, Urine YELLOW YELLOW   APPearance CLEAR CLEAR   Specific Gravity, Urine 1.014 1.005 - 1.030   pH 5.0 5.0 - 8.0   Glucose, UA NEGATIVE NEGATIVE mg/dL   Hgb urine dipstick SMALL (A) NEGATIVE    Bilirubin Urine NEGATIVE NEGATIVE   Ketones, ur NEGATIVE NEGATIVE mg/dL   Protein, ur NEGATIVE NEGATIVE mg/dL  Nitrite NEGATIVE NEGATIVE   Leukocytes,Ua NEGATIVE NEGATIVE   RBC / HPF 0-5 0 - 5 RBC/hpf   WBC, UA 0-5 0 - 5 WBC/hpf    Comment:        Reflex urine culture not performed if WBC <=10, OR if Squamous epithelial cells >5. If Squamous epithelial cells >5 suggest recollection.    Bacteria, UA NONE SEEN NONE SEEN   Squamous Epithelial / HPF 0-5 0 - 5 /HPF   Mucus PRESENT     Comment: Performed at [facility], [address], [place], [place] [address]   CT Head Wo Contrast  Result Date: 07/09/2023 CLINICAL DATA:  Mental status change, unknown cause EXAM: CT HEAD WITHOUT CONTRAST TECHNIQUE: Contiguous axial images were obtained from the base of the skull through the vertex without intravenous contrast. RADIATION DOSE REDUCTION: This exam was performed according to the departmental dose-optimization program which includes automated exposure control, adjustment of the mA and/or kV according to patient size and/or use of iterative reconstruction technique. COMPARISON:  CT head 09/27/2022. FINDINGS: Brain: No evidence of acute infarction, hemorrhage, hydrocephalus, extra-axial collection or mass lesion/mass effect. Vascular: No hyperdense vessel identified. Skull: No acute fracture. Sinuses/Orbits: Clear sinuses.  No acute orbital findings Other: No mastoid effusions. IMPRESSION: No evidence of acute intracranial abnormality. Electronically Signed   By: Feliberto Harts M.D.   On: 07/09/2023 18:13   DG Chest Port 1 View  Result Date: 07/09/2023 CLINICAL DATA:  Confusion EXAM: PORTABLE CHEST 1 VIEW COMPARISON:  None Available. FINDINGS: Patchy bilateral airspace disease noted in the right suprahilar and infrahilar region and in the left lower lobe. Heart and mediastinal contours within normal limits. Aortic atherosclerosis. No effusions or acute bony abnormality. IMPRESSION: Patchy  bilateral airspace disease.  Cannot exclude pneumonia. Electronically Signed   By: Charlett Nose M.D.   On: 07/09/2023 17:07    Pending Labs Unresulted Labs (From admission, onward)     Start     Ordered   07/16/23 0500  Creatinine, serum  (enoxaparin (LOVENOX)    CrCl >/= 30 ml/min)  Weekly,   R     Comments: while on enoxaparin therapy    07/09/23 1923   07/10/23 0500  Basic metabolic panel  Daily,   R      07/09/23 1923   07/10/23 0500  Hepatic function panel  Tomorrow morning,   R        07/09/23 1923   07/10/23 0500  CBC  Daily,   R      07/09/23 1923   07/09/23 1924  Procalcitonin  Daily,   R     References:    Procalcitonin Lower Respiratory Tract Infection AND Sepsis Procalcitonin Algorithm   07/09/23 1923   07/09/23 1921  Legionella Pneumophila Serogp 1 Ur Ag  (COPD / Pneumonia / Cellulitis / Lower Extremity Wound)  Add-on,   AD        07/09/23 1923   07/09/23 1921  Strep pneumoniae urinary antigen  (COPD / Pneumonia / Cellulitis / Lower Extremity Wound)  Add-on,   AD        07/09/23 1923   07/09/23 1917  SARS Coronavirus 2 by RT PCR (hospital order, performed in [facility] hospital lab) *cepheid single result test* Anterior Nasal Swab  (SARS Coronavirus 2 by RT PCR (hospital order, performed in [facility] hospital lab) *cepheid single result test*)  Once,   URGENT        07/09/23 1916   07/09/23 1658  Blood culture (routine x 2)  BLOOD CULTURE X 2,   R     Question:  Patient immune status  Answer:  Normal   07/09/23 1657            Vitals/Pain Today's Vitals   07/09/23 1752 07/09/23 1812 07/09/23 1827 07/09/23 1849  BP:  (!) 115/49    Pulse:  84    Resp:  16    Temp:    98.5 F (36.9 C)  TempSrc:    Oral  SpO2:  96%    Weight:      Height:      PainSc: 5  5  5       Isolation Precautions No active isolations  Medications Medications  azithromycin (ZITHROMAX) 500 mg in sodium chloride 0.9 % 250 mL IVPB (has no administration in time range)   atorvastatin (LIPITOR) tablet 40 mg (has no administration in time range)  clopidogrel (PLAVIX) tablet 75 mg (has no administration in time range)  enoxaparin (LOVENOX) injection 40 mg (has no administration in time range)  cefTRIAXone (ROCEPHIN) 2 g in sodium chloride 0.9 % 100 mL IVPB (has no administration in time range)  azithromycin (ZITHROMAX) tablet 500 mg (has no administration in time range)  sodium chloride flush (NS) 0.9 % injection 3 mL (has no administration in time range)  acetaminophen (TYLENOL) tablet 650 mg (has no administration in time range)    Or  acetaminophen (TYLENOL) suppository 650 mg (has no administration in time range)  senna-docusate (Senokot-S) tablet 1 tablet (has no administration in time range)  ondansetron (ZOFRAN) tablet 4 mg (has no administration in time range)    Or  ondansetron (ZOFRAN) injection 4 mg (has no administration in time range)  sodium chloride 0.9 % bolus 1,000 mL (0 mLs Intravenous Stopped 07/09/23 1845)  acetaminophen (TYLENOL) tablet 1,000 mg (1,000 mg Oral Given 07/09/23 1713)  cefTRIAXone (ROCEPHIN) 2 g in sodium chloride 0.9 % 100 mL IVPB (2 g Intravenous New Bag/Given 07/09/23 1856)    Mobility walks     Focused Assessments Pulmonary Assessment Handoff:  Lung sounds:   O2 Device: Room Air      R Recommendations: See Admitting Provider Note  Report given to:   Additional Notes: Pt ambulatory at baseline but woke up this am with weakness and unable to stand without assistance. Husband also reporting incontinence this am. Alert and oriented X4 at baseline. Being treated with abx at this time.

## 2023-07-09 NOTE — H&P (Signed)
History and Physical    Nancy Vasquez:[id number] DOB: [birth year] DOA: 07/09/2023  PCP: Hoy Register, MD   Patient coming from: Home   Chief Complaint: Fever, somnolence, confusion   HPI: Nancy Vasquez is a pleasant 82 y.o. female with medical history significant for CAD, TIA, hyperlipidemia, and arthritis who presents to the emergency department with fever, somnolence, and confusion.  Patient developed subjective fever and chills last night but was otherwise in her usual state.  She was noted to be somnolent, confused, and newly incontinent this morning.  She denies cough, rhinorrhea, shortness of breath, abdominal pain, nausea, vomiting, diarrhea, dysuria, rash, wounds, or red/hot/swollen joints.  She denies sick contacts.  ED Course: Upon arrival to the ED, patient is found to be febrile to 38.4 C and saturating mid 90s on room air with normal heart rate and stable blood pressure.  EKG demonstrates sinus or ectopic atrial rhythm with PVC.  Chest x-ray notable for patchy bilateral airspace disease.  Head CT is negative for acute findings.  Labs are most notable for creatinine 1.05, bilirubin 1.6, WBC 16,500, and normal lactic acid.  Cultures were collected in the ED and the patient was given 1 L of NS, acetaminophen, Rocephin, and azithromycin.  Review of Systems:  All other systems reviewed and apart from HPI, are negative.  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   Folliculitis 04/10/2023    Past Surgical History:  Procedure Laterality Date   ABDOMINAL HYSTERECTOMY     BREAST EXCISIONAL BIOPSY Left    CORONARY PRESSURE/FFR STUDY N/A 03/17/2017   Procedure: Intravascular Pressure Wire/FFR Study;  Surgeon: Rinaldo Cloud, MD;  Location: [facility] INVASIVE CV LAB;  Service: Cardiovascular;  Laterality: N/A;  mid LAD   LEFT HEART CATH AND CORONARY ANGIOGRAPHY N/A  03/17/2017   Procedure: Left Heart Cath and Coronary Angiography;  Surgeon: Rinaldo Cloud, MD;  Location: [facility] INVASIVE CV LAB;  Service: Cardiovascular;  Laterality: N/A;   TONSILLECTOMY     Removed as a child   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   Procedure: LEFT TOTAL KNEE ARTHROPLASTY;  Surgeon: Cammy Copa, MD;  Location: [facility] OR;  Service: Orthopedics;  Laterality: Left;    Social History:   reports that she has never smoked. She has never used smokeless tobacco. She reports that she does not drink alcohol and does not use drugs.  Allergies  Allergen Reactions   Ibuprofen Nausea And Vomiting and Other (See Comments)    "burns stomach"   Oxycodone Nausea Only    "Burns stomach"    Family History  Problem Relation Age of Onset   Breast cancer Sister 70       metastatic breast ca     Prior to Admission medications   Medication Sig Start Date End Date Taking? Authorizing Provider  atorvastatin (LIPITOR) 40 MG tablet Take 1 tablet (40 mg total) by mouth daily. 04/23/23 05/09/26  Merrilyn Puma, MD  clopidogrel (PLAVIX) 75 MG tablet Take 1 tablet (75 mg total) by mouth daily. 04/23/23   Merrilyn Puma, MD  ofloxacin (FLOXIN) 0.3 % OTIC solution Place 5 drops into the left ear 2 (two) times daily. Patient not taking: Reported on 05/26/2023 05/02/23   Hoy Register, MD  traMADol (ULTRAM) 50 MG tablet Take 1 tablet (50 mg total) by mouth every 8 (eight) hours as needed for up to 5 days.  07/08/23 07/13/23  Mayers, Cari S, PA-C  Vitamin D, Ergocalciferol, (DRISDOL) 1.25 MG (50000 UNIT) CAPS capsule Take 50,000 Units by mouth once a week.    [provider]    Physical Exam: Vitals:   07/09/23 1631 07/09/23 1653 07/09/23 1812 07/09/23 1849  BP:  136/71 (!) 115/49   Pulse:  80 84   Resp:  17 16   Temp:  (!) 101.2 F (38.4 C)  98.5 F (36.9 C)  TempSrc:  Oral  Oral  SpO2:  95% 96%   Weight: 65.8 kg     Height: 5\' 4"  (1.626 m)        Constitutional: NAD, calm   Eyes: PERTLA, lids and conjunctivae normal ENMT: Mucous membranes are moist. Posterior pharynx clear of any exudate or lesions.   Neck: supple, no masses  Respiratory: no wheezing, no crackles. No accessory muscle use.  Cardiovascular: S1 & S2 heard, regular rate and rhythm. No extremity edema.   Abdomen: No distension, no tenderness, soft. Bowel sounds active.  Musculoskeletal: no clubbing / cyanosis. No meningismus. No red/hot/swollen joints.   Skin: no significant rashes, lesions, ulcers. Warm, dry, well-perfused. Neurologic: CN 2-12 grossly intact. Sensation intact. Moving all extremities. Alert and oriented.  Psychiatric: Pleasant. Cooperative.    Labs and Imaging on Admission: I have personally reviewed following labs and imaging studies  CBC: Recent Labs  Lab 07/09/23 1653  WBC 16.5*  NEUTROABS 14.0*  HGB 12.6  HCT 39.2  MCV 92.9  PLT 212   Basic Metabolic Panel: Recent Labs  Lab 07/09/23 1653  NA 136  K 3.5  CL 103  CO2 23  GLUCOSE 110*  BUN 10  CREATININE 1.05*  CALCIUM 9.0   GFR: Estimated Creatinine Clearance: 40.5 mL/min (A) (by C-G formula based on SCr of 1.05 mg/dL (H)). Liver Function Tests: Recent Labs  Lab 07/09/23 1653  AST 19  ALT 15  ALKPHOS 56  BILITOT 1.6*  PROT 8.0  ALBUMIN 3.7   No results for input(s): "LIPASE", "AMYLASE" in the last 168 hours. Recent Labs  Lab 07/09/23 1653  AMMONIA 12   Coagulation Profile: No results for input(s): "INR", "PROTIME" in the last 168 hours. Cardiac Enzymes: No results for input(s): "CKTOTAL", "CKMB", "CKMBINDEX", "TROPONINI" in the last 168 hours. BNP (last 3 results) No results for input(s): "PROBNP" in the last 8760 hours. HbA1C: No results for input(s): "HGBA1C" in the last 72 hours. CBG: No results for input(s): "GLUCAP" in the last 168 hours. Lipid Profile: No results for input(s): "CHOL", "HDL", "LDLCALC", "TRIG", "CHOLHDL", "LDLDIRECT" in the last 72 hours. Thyroid Function  Tests: Recent Labs    07/09/23 1653  TSH 0.801   Anemia Panel: No results for input(s): "VITAMINB12", "FOLATE", "FERRITIN", "TIBC", "IRON", "RETICCTPCT" in the last 72 hours. Urine analysis:    Component Value Date/Time   COLORURINE YELLOW 07/09/2023 1746   APPEARANCEUR CLEAR 07/09/2023 1746   LABSPEC 1.014 07/09/2023 1746   PHURINE 5.0 07/09/2023 1746   GLUCOSEU NEGATIVE 07/09/2023 1746   HGBUR SMALL (A) 07/09/2023 1746   BILIRUBINUR NEGATIVE 07/09/2023 1746   KETONESUR NEGATIVE 07/09/2023 1746   PROTEINUR NEGATIVE 07/09/2023 1746   UROBILINOGEN 0.2 11/19/2014 1832   NITRITE NEGATIVE 07/09/2023 1746   LEUKOCYTESUR NEGATIVE 07/09/2023 1746   Sepsis Labs: @LABRCNTIP (procalcitonin:4,lacticidven:4) )No results found for this or any previous visit (from the past 240 hour(s)).   Radiological Exams on Admission: CT Head Wo Contrast  Result Date: 07/09/2023 CLINICAL DATA:  Mental status change, unknown cause EXAM:  CT HEAD WITHOUT CONTRAST TECHNIQUE: Contiguous axial images were obtained from the base of the skull through the vertex without intravenous contrast. RADIATION DOSE REDUCTION: This exam was performed according to the departmental dose-optimization program which includes automated exposure control, adjustment of the mA and/or kV according to patient size and/or use of iterative reconstruction technique. COMPARISON:  CT head 09/27/2022. FINDINGS: Brain: No evidence of acute infarction, hemorrhage, hydrocephalus, extra-axial collection or mass lesion/mass effect. Vascular: No hyperdense vessel identified. Skull: No acute fracture. Sinuses/Orbits: Clear sinuses.  No acute orbital findings Other: No mastoid effusions. IMPRESSION: No evidence of acute intracranial abnormality. Electronically Signed   By: Feliberto Harts M.D.   On: 07/09/2023 18:13   DG Chest Port 1 View  Result Date: 07/09/2023 CLINICAL DATA:  Confusion EXAM: PORTABLE CHEST 1 VIEW COMPARISON:  None Available.  FINDINGS: Patchy bilateral airspace disease noted in the right suprahilar and infrahilar region and in the left lower lobe. Heart and mediastinal contours within normal limits. Aortic atherosclerosis. No effusions or acute bony abnormality. IMPRESSION: Patchy bilateral airspace disease.  Cannot exclude pneumonia. Electronically Signed   By: Charlett Nose M.D.   On: 07/09/2023 17:07    EKG: Independently reviewed. Sinus or ectopic atrial rhythm, PVC.   Assessment/Plan   1. Sepsis  - Fever and leukocytosis present on admission  - CXR findings could reflect pneumonia but she does not have any significant respiratory s/s  - Continue Rocephin and azithromycin, check/trend procalcitonin, follow cultures and clinical course    2. Acute encephalopathy  - No acute findings on head CT  - Improved dramatically with APAP and IVF in ED, was likely d/t fever/infection  - Continue to treat suspected sepsis as above, use delirium precautions, expand w/u if fails to resolve as expected    3. CAD  - No anginal complaints   - Continue Lipitor and Plavix    4. Hx of TIA  - Continue Lipitor and Plavix    DVT prophylaxis: Lovenox  Code Status: DNR  Level of Care: Level of care: Telemetry Medical Family Communication: Husband and niece at bedside   Disposition Plan:  Patient is from: Home  Anticipated d/c is to: TBD Anticipated d/c date is: 07/12/23  Patient currently: Pending resolution of sepsis, stable mental status  Consults called: None  Admission status: Inpatient     Briscoe Deutscher, MD [facility]  07/09/2023, 7:23 PM

## 2023-07-09 NOTE — ED Notes (Signed)
Pt placed on a chucks pad and diaper due to incontinence.

## 2023-07-09 NOTE — Progress Notes (Signed)
Pt received to 2W room 7 from ED.  Pt and family oriented to room and call bell.  Tele box 18 placed on pt, NSR w/ occasional PVC's.  Pt denies pain or distress.  See admission assessment and vs's.

## 2023-07-09 NOTE — ED Provider Notes (Signed)
[place] EMERGENCY DEPARTMENT AT [facility] Provider Note  CSN: [id number] Arrival date & time: 07/09/23 1621  Chief Complaint(s) Weakness (Pt coming from home with weakness and incontinence of urine. Pt went to bed last night feeling fine and woke with these symptoms. Pt is normally ambulatory and not incontinent. )  HPI Nancy Vasquez is a 82 y.o. female history of hyperlipidemia, coronary artery disease, prior stroke presenting to the emergency department with confusion.  Patient apparently last night was normal and this morning seem to have increased confusion on waking.  Patient is also urinary incontinence and generalized weakness.  No reported fevers or other symptoms.  Has been receiving tramadol for chronic knee pain.  Patient unable to provide much history seems confused but is able to state her name that she is at [facility].  When asked whether she knows why she was here the patient reports "yes" but is unable to elaborate. 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 Authorizing Provider  atorvastatin (LIPITOR) 40 MG tablet Take 1 tablet (40 mg total) by mouth daily. 04/23/23 05/09/26  Merrilyn Puma, MD  clopidogrel (PLAVIX) 75 MG tablet Take 1 tablet (75 mg total) by mouth daily. 04/23/23   Merrilyn Puma, MD  ofloxacin (FLOXIN) 0.3 % OTIC  solution Place 5 drops into the left ear 2 (two) times daily. Patient not taking: Reported on 05/26/2023 05/02/23   Hoy Register, MD  traMADol (ULTRAM) 50 MG tablet Take 1 tablet (50 mg total) by mouth every 8 (eight) hours as needed for up to 5 days. 07/08/23 07/13/23  Mayers, Cari S, PA-C  Vitamin D, Ergocalciferol, (DRISDOL) 1.25 MG (50000 UNIT) CAPS capsule Take 50,000 Units by mouth once a week.    [provider]                                                                                                                                    Past Surgical History Past Surgical History:  Procedure Laterality Date   ABDOMINAL HYSTERECTOMY     BREAST EXCISIONAL BIOPSY Left    CORONARY PRESSURE/FFR STUDY N/A 03/17/2017   Procedure: Intravascular Pressure Wire/FFR Study;  Surgeon: Rinaldo Cloud, MD;  Location: [facility] INVASIVE CV LAB;  Service: Cardiovascular;  Laterality: N/A;  mid LAD   LEFT  HEART CATH AND CORONARY ANGIOGRAPHY N/A 03/17/2017   Procedure: Left Heart Cath and Coronary Angiography;  Surgeon: Rinaldo Cloud, MD;  Location: [facility] INVASIVE CV LAB;  Service: Cardiovascular;  Laterality: N/A;   TONSILLECTOMY     Removed as a child   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   Procedure: LEFT TOTAL KNEE ARTHROPLASTY;  Surgeon: Cammy Copa, MD;  Location: [facility] OR;  Service: Orthopedics;  Laterality: Left;   Family History Family History  Problem Relation Age of Onset   Breast cancer Sister 47       metastatic breast ca    Social History Social History   Tobacco Use   Smoking status: Never   Smokeless tobacco: Never  Substance Use Topics   Alcohol use: Never   Drug use: Never   Allergies Ibuprofen and Oxycodone  Review of Systems Review of Systems  All other systems reviewed and are negative.   Physical Exam Vital Signs  I have reviewed the triage vital signs BP (!) 115/49 (BP Location: Left Arm)   Pulse 84   Temp 98.5 F (36.9 C) (Oral)   Resp 16   Ht 5'  4" (1.626 m)   Wt 65.8 kg   SpO2 96%   BMI 24.89 kg/m  Physical Exam Vitals and nursing note reviewed.  Constitutional:      General: She is not in acute distress.    Appearance: She is well-developed.  HENT:     Head: Normocephalic and atraumatic.     Mouth/Throat:     Mouth: Mucous membranes are moist.  Eyes:     Pupils: Pupils are equal, round, and reactive to light.  Cardiovascular:     Rate and Rhythm: Normal rate and regular rhythm.     Heart sounds: No murmur heard. Pulmonary:     Effort: Pulmonary effort is normal. No respiratory distress.     Breath sounds: Normal breath sounds.  Abdominal:     General: Abdomen is flat.     Palpations: Abdomen is soft.     Tenderness: There is no abdominal tenderness. There is no right CVA tenderness or left CVA tenderness.  Musculoskeletal:        General: No tenderness.     Cervical back: Neck supple.     Right lower leg: No edema.     Left lower leg: No edema.     Comments: Surgical scar over the left knee with no effusion or limitation in range of motion, no erythema or warmth  Skin:    General: Skin is warm and dry.  Neurological:     Mental Status: She is alert.     Comments: No cranial nerve deficit, follows commands, strength 5 out of 5 in the bilateral upper and lower extremity, no sensory deficit to light touch  Psychiatric:        Mood and Affect: Mood normal.        Behavior: Behavior normal.     ED Results and Treatments Labs (all labs ordered are listed, but only abnormal results are displayed) Labs Reviewed  COMPREHENSIVE METABOLIC PANEL - Abnormal; Notable for the following components:      Result Value   Glucose, Bld 110 (*)    Creatinine, Ser 1.05 (*)    Total Bilirubin 1.6 (*)    GFR, Estimated 54 (*)    All other components within normal limits  CBC WITH DIFFERENTIAL/PLATELET - Abnormal; Notable for the following components:   WBC 16.5 (*)    Neutro  Abs 14.0 (*)    Monocytes Absolute 1.3 (*)     All other components within normal limits  URINALYSIS, W/ REFLEX TO CULTURE (INFECTION SUSPECTED) - Abnormal; Notable for the following components:   Hgb urine dipstick SMALL (*)    All other components within normal limits  CULTURE, BLOOD (ROUTINE X 2)  CULTURE, BLOOD (ROUTINE X 2)  SARS CORONAVIRUS 2 BY RT PCR  TSH  AMMONIA  LACTIC ACID, PLASMA                                                                                                                          Radiology CT Head Wo Contrast  Result Date: 07/09/2023 CLINICAL DATA:  Mental status change, unknown cause EXAM: CT HEAD WITHOUT CONTRAST TECHNIQUE: Contiguous axial images were obtained from the base of the skull through the vertex without intravenous contrast. RADIATION DOSE REDUCTION: This exam was performed according to the departmental dose-optimization program which includes automated exposure control, adjustment of the mA and/or kV according to patient size and/or use of iterative reconstruction technique. COMPARISON:  CT head 09/27/2022. FINDINGS: Brain: No evidence of acute infarction, hemorrhage, hydrocephalus, extra-axial collection or mass lesion/mass effect. Vascular: No hyperdense vessel identified. Skull: No acute fracture. Sinuses/Orbits: Clear sinuses.  No acute orbital findings Other: No mastoid effusions. IMPRESSION: No evidence of acute intracranial abnormality. Electronically Signed   By: Feliberto Harts M.D.   On: 07/09/2023 18:13   DG Chest Port 1 View  Result Date: 07/09/2023 CLINICAL DATA:  Confusion EXAM: PORTABLE CHEST 1 VIEW COMPARISON:  None Available. FINDINGS: Patchy bilateral airspace disease noted in the right suprahilar and infrahilar region and in the left lower lobe. Heart and mediastinal contours within normal limits. Aortic atherosclerosis. No effusions or acute bony abnormality. IMPRESSION: Patchy bilateral airspace disease.  Cannot exclude pneumonia. Electronically Signed   By: Charlett Nose M.D.    On: 07/09/2023 17:07    Pertinent labs & imaging results that were available during my care of the patient were reviewed by me and considered in my medical decision making (see MDM for details).  Medications Ordered in ED Medications  cefTRIAXone (ROCEPHIN) 2 g in sodium chloride 0.9 % 100 mL IVPB (2 g Intravenous New Bag/Given 07/09/23 1856)  azithromycin (ZITHROMAX) 500 mg in sodium chloride 0.9 % 250 mL IVPB (has no administration in time range)  sodium chloride 0.9 % bolus 1,000 mL (0 mLs Intravenous Stopped 07/09/23 1845)  acetaminophen (TYLENOL) tablet 1,000 mg (1,000 mg Oral Given 07/09/23 1713)  Procedures Procedures  (including critical care time)  Medical Decision Making / ED Course   MDM:  82 year old female presenting to the emergency department with confusion.  Patient overall well-appearing, vitals are reassuring, but she does seem confused.  She is oriented to self and place but not situation or year.  Physical exam otherwise nonfocal.  Will obtain testing to evaluate for underlying causes such as toxic or metabolic encephalopathy, will check labs including ammonia, TSH and CMP.  Will also check urinalysis, chest x-ray to evaluate for occult infectious process although no clear signs of infection on physical exam, no abdominal tenderness, and chronic knee pain without sign of acute infection.  Will check CT head given prior history of stroke to evaluate for bleeding or acute intracranial process.  Given reported baseline patient seems significantly off baseline and may ultimately require admission.  Clinical Course as of 07/09/23 1921  Thu Jul 09, 2023  1708 Patient noted to have fever, infectious workup expanded with blood cx and lactate.  [WS]  1720 CXR has some findings concerning for possible pneumonia but not clear source, will obtain  urinalysis prior to antibiotics [WS]  1917 Patient husband, also reports patient did have a fever last night.  Patient appears much better and is now oriented x 4 after getting Tylenol and improvement in her fever.  Suspect her encephalopathy was due to fever.  Given elevated white count and age will still admit for further management.  Treated for possible pneumonia.  Very low concern for alternative process such as intra-abdominal infection or meningitis. Low concern infectious source is her knee.  [WS]    Clinical Course User Index [WS] Lonell Grandchild, MD     Additional history obtained: -Additional history obtained from ems and spouse -External records from outside source obtained and reviewed including: Chart review including previous notes, labs, imaging, consultation notes including video visit for knee pain yesterday   Lab Tests: -I ordered, reviewed, and interpreted labs.   The pertinent results include:   Labs Reviewed  COMPREHENSIVE METABOLIC PANEL - Abnormal; Notable for the following components:      Result Value   Glucose, Bld 110 (*)    Creatinine, Ser 1.05 (*)    Total Bilirubin 1.6 (*)    GFR, Estimated 54 (*)    All other components within normal limits  CBC WITH DIFFERENTIAL/PLATELET - Abnormal; Notable for the following components:   WBC 16.5 (*)    Neutro Abs 14.0 (*)    Monocytes Absolute 1.3 (*)    All other components within normal limits  URINALYSIS, W/ REFLEX TO CULTURE (INFECTION SUSPECTED) - Abnormal; Notable for the following components:   Hgb urine dipstick SMALL (*)    All other components within normal limits  CULTURE, BLOOD (ROUTINE X 2)  CULTURE, BLOOD (ROUTINE X 2)  SARS CORONAVIRUS 2 BY RT PCR  TSH  AMMONIA  LACTIC ACID, PLASMA    Notable for normal lactic acid, leukocytosis  EKG   EKG Interpretation Date/Time:  Thursday July 09 2023 16:46:19 EDT Ventricular Rate:  81 PR Interval:  139 QRS Duration:  80 QT Interval:  352 QTC  Calculation: 409 R Axis:   27  Text Interpretation: Sinus or ectopic atrial rhythm Ventricular premature complex Abnormal R-wave progression, early transition Borderline repolarization abnormality Confirmed by Alvino Blood ([id number]) on 07/09/2023 4:50:18 PM         Imaging Studies ordered: I ordered imaging studies including CXR On my interpretation imaging demonstrates possible pneumonia I  independently visualized and interpreted imaging. I agree with the radiologist interpretation   Medicines ordered and prescription drug management: Meds ordered this encounter  Medications   sodium chloride 0.9 % bolus 1,000 mL   acetaminophen (TYLENOL) tablet 1,000 mg   cefTRIAXone (ROCEPHIN) 2 g in sodium chloride 0.9 % 100 mL IVPB    Order Specific Question:   Antibiotic Indication:    Answer:   CAP   azithromycin (ZITHROMAX) 500 mg in sodium chloride 0.9 % 250 mL IVPB    -I have reviewed the patients home medicines and have made adjustments as needed   Consultations Obtained: I requested consultation with the hospitalist,  and discussed lab and imaging findings as well as pertinent plan - they recommend: admission   Cardiac Monitoring: The patient was maintained on a cardiac monitor.  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    Final Clinical Impression(s) / ED Diagnoses Final diagnoses:  Metabolic encephalopathy  Fever,  unspecified fever cause     This chart was dictated using voice recognition software.  Despite best efforts to proofread,  errors can occur which can change the documentation meaning.    Lonell Grandchild, MD 07/09/23 [facility] [place]

## 2023-07-09 NOTE — ED Notes (Signed)
Pt is alert and oriented to self/DOB/place/situation. She is unable to tell me the year and when asking about pain, weight and height pt is unable to finish her sentence and seems to fall asleep. Pt moved to room from hall, changed into a gown, attached to monitor/vitals. Side rails x 2, call light with in reach. Husband at the bedside.

## 2023-07-10 ENCOUNTER — Inpatient Hospital Stay (HOSPITAL_COMMUNITY): Payer: Medicare Other

## 2023-07-10 DIAGNOSIS — R509 Fever, unspecified: Secondary | ICD-10-CM

## 2023-07-10 LAB — HEPATIC FUNCTION PANEL
ALT: 14 U/L (ref 0–44)
AST: 19 U/L (ref 15–41)
Albumin: 2.9 g/dL — ABNORMAL LOW (ref 3.5–5.0)
Alkaline Phosphatase: 50 U/L (ref 38–126)
Bilirubin, Direct: 0.3 mg/dL — ABNORMAL HIGH (ref 0.0–0.2)
Indirect Bilirubin: 0.7 mg/dL (ref 0.3–0.9)
Total Bilirubin: 1 mg/dL (ref 0.3–1.2)
Total Protein: 6.5 g/dL (ref 6.5–8.1)

## 2023-07-10 LAB — CBC
HCT: 33.9 % — ABNORMAL LOW (ref 36.0–46.0)
Hemoglobin: 11 g/dL — ABNORMAL LOW (ref 12.0–15.0)
MCH: 30.5 pg (ref 26.0–34.0)
MCHC: 32.4 g/dL (ref 30.0–36.0)
MCV: 93.9 fL (ref 80.0–100.0)
Platelets: 175 10*3/uL (ref 150–400)
RBC: 3.61 MIL/uL — ABNORMAL LOW (ref 3.87–5.11)
RDW: 11.9 % (ref 11.5–15.5)
WBC: 13.7 10*3/uL — ABNORMAL HIGH (ref 4.0–10.5)
nRBC: 0 % (ref 0.0–0.2)

## 2023-07-10 LAB — RESPIRATORY PANEL BY PCR

## 2023-07-10 LAB — BASIC METABOLIC PANEL
Anion gap: 8 (ref 5–15)
BUN: 8 mg/dL (ref 8–23)
CO2: 24 mmol/L (ref 22–32)
Calcium: 8.8 mg/dL — ABNORMAL LOW (ref 8.9–10.3)
Chloride: 104 mmol/L (ref 98–111)
Creatinine, Ser: 0.84 mg/dL (ref 0.44–1.00)
GFR, Estimated: >60 mL/min (ref >60)
Glucose, Bld: 118 mg/dL — ABNORMAL HIGH (ref 70–99)
Potassium: 3.7 mmol/L (ref 3.5–5.1)
Sodium: 136 mmol/L (ref 135–145)

## 2023-07-10 LAB — PROCALCITONIN: Procalcitonin: 0.44 ng/mL

## 2023-07-10 MED ORDER — DICLOFENAC SODIUM 1 % EX GEL
2.0000 g | Freq: Four times a day (QID) | CUTANEOUS | Status: DC
  Administered 2023-07-10 – 2023-07-11 (×4): 2 g via TOPICAL
  Filled 2023-07-10: qty 100

## 2023-07-10 MED ORDER — MIRTAZAPINE 15 MG PO TABS
7.5000 mg | ORAL_TABLET | Freq: Every day | ORAL | Status: DC
  Administered 2023-07-10: 7.5 mg via ORAL
  Filled 2023-07-10: qty 1

## 2023-07-10 MED ORDER — SODIUM CHLORIDE 0.9 % IV SOLN: INTRAVENOUS | Status: DC

## 2023-07-10 NOTE — Progress Notes (Signed)
Transition of Care [facility]) - Inpatient Brief Assessment   Patient Details  Name: Nancy Vasquez MRN: [id number] Date of Birth: 22-Jul-1941  Transition of Care [facility]) CM/SW Contact:    Janae Bridgeman, RN Phone Number: 07/10/2023, 12:58 PM   Clinical Narrative: CM met with the patient at the bedside with the daughter and the patient was admitted for septicemia, fever and pneumonia.  The patient is currently on room air.  The patient is normally independent and drives normally to appointments.  The patient's daughter lives in [place], [place] and is able to visit - she is currently at the bedside with the patient.  No TOC needs at this time.   Transition of Care Asessment: Insurance and Status: (P) Insurance coverage has been reviewed Patient has primary care physician: (P) Yes Home environment has been reviewed: (P) Yes - patient lives at home with the spouse Prior level of function:: (P) Independent at home with the spouse Prior/Current Home Services: (P) No current home services Social Determinants of Health Reivew: (P) SDOH reviewed no interventions necessary Readmission risk has been reviewed: (P) Yes Transition of care needs: (P) no transition of care needs at this time

## 2023-07-10 NOTE — Progress Notes (Signed)
TRH night cross cover note:   I was notified by RN that the patient is exhibiting evidence of altered mental status, noting that she has become less responsive, and is now oriented only to herself.  Course: Including signs notable for interval increase in temperature to 103.1, will stop blood pressures are in the 1 teens to 130s, heart rates in the 80s, she is maintaining oxygen saturations in the mid to high 90s on room air.   It appears that she was admitted earlier during this night shift for altered mental status in the setting of suspected pneumonia after presenting with fever.  It appears that her mental status improved with improving fever after initiation of acetaminophen and IV antibiotics for community-acquired pneumonia in the emergency department.   Patient has received additional acetaminophen, with ensuing decrease in her temperature from 103.1-100.6, corresponding with an improvement in the patient's mental status/responsiveness.  Is at this time that I evaluated the patient at bedside, and spoke with her daughter who is also present at bedside.  At the time of my evaluation, the patient is alert, and oriented to person, place, time.  No evidence of sensory deficits.  Strength is 5 out of 5 in the upper extremities.  Assessment of strength in the lower extremities is limited by patient's report of progressive pain associated with the left knee over the last few weeks, for which time, she conveys that she has developed increased swelling, as well as increased warmth relative to her right knee.  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  Daughter conveys that the patient has a history of CVA, and she is concerned that the patient may have experienced an additional stroke leading to her altered mental status this evening.  She notes no residual focal neurologic deficits stemming from her original CVA.  She underwent CT head in the ED today, which showed no evidence of acute  process.  As the patient's mental status appears to be improving corresponding with improvement in degree of fever, suspect that her altered mental status is stemming from her underlying infection and fever.  Will continue existing IV antibiotics and prn acetaminophen.  Given patient's daughter's concerns in the setting of the above history, will also check MRI brain, and pursue plain films of the left knee.     Newton Pigg, DO Hospitalist

## 2023-07-10 NOTE — Progress Notes (Signed)
PROGRESS NOTE    Nancy Vasquez  ZOX:[id number] DOB: [birth year] DOA: 07/09/2023 PCP: Hoy Register, MD    Brief Narrative:  Nancy Vasquez is a pleasant 82 y.o. female with medical history significant for CAD, TIA, hyperlipidemia, and arthritis who presents to the emergency department with fever, somnolence, and confusion.   Unclear source of infection thus far.   Assessment and Plan:  SIRS with suspected infection but source unclear - Fever and leukocytosis present on admission  - CXR findings could reflect pneumonia but she does not have any significant respiratory s/s -COVID negative -check NP swab -trend procal  - Continue Rocephin and azithromycin - follow cultures and clinical course     Acute encephalopathy  - No acute findings on head CT , MRI pending - Improved dramatically with APAP and IVF in ED, was likely d/t fever/infection  - Continue to treat suspected sepsis as above, use delirium precautions, expand w/u if fails to resolve as expected      CAD  - No anginal complaints   - Continue Lipitor and Plavix      Hx of TIA  - Continue Lipitor and Plavix   Left knee pain -x ray unremarkable -trial of voltaren gel  DVT prophylaxis: enoxaparin (LOVENOX) injection 40 mg Start: 07/09/23 1930    Code Status: DNR Family Communication: at bedside  Disposition Plan:  Level of care: Telemetry Medical Status is: Inpatient Remains inpatient appropriate    Consultants:  none   Subjective: Does have some left ear pain  Objective: Vitals:   07/10/23 0416 07/10/23 0501 07/10/23 0758 07/10/23 1242  BP:   (!) 118/48 129/81  Pulse:   71 79  Resp:   16 14  Temp: (!) 100.6 F (38.1 C) 99.6 F (37.6 C) 98.7 F (37.1 C) (!) 101.7 F (38.7 C)  TempSrc: Oral Oral Oral Oral  SpO2:   99% 98%  Weight:      Height:        Intake/Output Summary (Last 24 hours) at 07/10/2023 1251 Last data filed at 07/10/2023 0400 Gross per 24 hour  Intake --  Output 300  ml  Net -300 ml   Filed Weights   07/09/23 1631 07/09/23 2030  Weight: 65.8 kg 69.2 kg    Examination:   General: Appearance:     Overweight female in no acute distress     Lungs:     Clear to auscultation bilaterally, respirations unlabored  Heart:    Normal heart rate. Normal rhythm. No murmurs, rubs, or gallops.    MS:   All extremities are intact.    Neurologic:   Awake, alert       Data Reviewed: I have personally reviewed following labs and imaging studies  CBC: Recent Labs  Lab 07/09/23 1653 07/10/23 0717  WBC 16.5* 13.7*  NEUTROABS 14.0*  --   HGB 12.6 11.0*  HCT 39.2 33.9*  MCV 92.9 93.9  PLT 212 175   Basic Metabolic Panel: Recent Labs  Lab 07/09/23 1653 07/10/23 0717  NA 136 136  K 3.5 3.7  CL 103 104  CO2 23 24  GLUCOSE 110* 118*  BUN 10 8  CREATININE 1.05* 0.84  CALCIUM 9.0 8.8*   GFR: Estimated Creatinine Clearance: 53.1 mL/min (by C-G formula based on SCr of 0.84 mg/dL). Liver Function Tests: Recent Labs  Lab 07/09/23 1653 07/10/23 0717  AST 19 19  ALT 15 14  ALKPHOS 56 50  BILITOT 1.6* 1.0  PROT 8.0 6.5  ALBUMIN 3.7 2.9*   No results for input(s): "LIPASE", "AMYLASE" in the last 168 hours. Recent Labs  Lab 07/09/23 1653  AMMONIA 12   Coagulation Profile: No results for input(s): "INR", "PROTIME" in the last 168 hours. Cardiac Enzymes: No results for input(s): "CKTOTAL", "CKMB", "CKMBINDEX", "TROPONINI" in the last 168 hours. BNP (last 3 results) No results for input(s): "PROBNP" in the last 8760 hours. HbA1C: No results for input(s): "HGBA1C" in the last 72 hours. CBG: No results for input(s): "GLUCAP" in the last 168 hours. Lipid Profile: No results for input(s): "CHOL", "HDL", "LDLCALC", "TRIG", "CHOLHDL", "LDLDIRECT" in the last 72 hours. Thyroid Function Tests: Recent Labs    07/09/23 1653  TSH 0.801   Anemia Panel: No results for input(s): "VITAMINB12", "FOLATE", "FERRITIN", "TIBC", "IRON", "RETICCTPCT" in  the last 72 hours. Sepsis Labs: Recent Labs  Lab 07/09/23 1653 07/09/23 2000 07/10/23 0717  PROCALCITON  --  0.27 0.44  LATICACIDVEN 1.4  --   --     Recent Results (from the past 240 hour(s))  Blood culture (routine x 2)     Status: None (Preliminary result)   Collection Time: 07/09/23  5:04 PM   Specimen: BLOOD  Result Value Ref Range Status   Specimen Description BLOOD RIGHT ANTECUBITAL  Final   Special Requests   Final    BOTTLES DRAWN AEROBIC AND ANAEROBIC Blood Culture results may not be optimal due to an excessive volume of blood received in culture bottles   Culture   Final    NO GROWTH < 24 HOURS Performed at [facility] [place] [facility], [address], [place], [place] [address]    Report Status PENDING  Incomplete  Blood culture (routine x 2)     Status: None (Preliminary result)   Collection Time: 07/09/23  5:21 PM   Specimen: BLOOD  Result Value Ref Range Status   Specimen Description BLOOD RIGHT ANTECUBITAL  Final   Special Requests   Final    BOTTLES DRAWN AEROBIC AND ANAEROBIC Blood Culture results may not be optimal due to an excessive volume of blood received in culture bottles   Culture   Final    NO GROWTH < 24 HOURS Performed at [facility] [place] [facility], [address], [place], [place] [address]    Report Status PENDING  Incomplete  SARS Coronavirus 2 by RT PCR (hospital order, performed in [facility] hospital lab) *cepheid single result test* Anterior Nasal Swab     Status: None   Collection Time: 07/09/23  8:00 PM   Specimen: Anterior Nasal Swab  Result Value Ref Range Status   SARS Coronavirus 2 by RT PCR NEGATIVE NEGATIVE Final    Comment: Performed at [facility], [address] [place], [place] [address]         Radiology Studies: MR BRAIN WO CONTRAST  Result Date: 07/10/2023 CLINICAL DATA:  Mental status change with unknown cause EXAM: MRI HEAD WITHOUT CONTRAST TECHNIQUE: Multiplanar, multiecho pulse sequences of the brain and surrounding  structures were obtained without intravenous contrast. COMPARISON:  Head CT from yesterday.  Brain MRI 11/27/2022 FINDINGS: Brain: Mild for age cerebral volume loss. Chronic small vessel ischemia that is mild in the cerebral white matter and mild-to-moderate in the pons. No acute or subacute infarct, hemorrhage, hydrocephalus, or mass. Vascular: Major flow voids are preserved Skull and upper cervical spine: No focal marrow lesion. C3-4 facet ankylosis. Notable left facet degeneration at C2-3 with possible marrow edema. Sinuses/Orbits: No acute finding. Other: Bilobed  mass in the left parotid measuring 2.2 cm in craniocaudal span, unchanged from comparison brain MRI IMPRESSION: 1. No acute finding. 2. Mild-to-moderate chronic small vessel ischemia. 3. 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 Electronically Signed   By: Tiburcio Pea M.D.   On: 07/10/2023 11:13   DG Knee 3 Views Left  Result Date: 07/10/2023 CLINICAL DATA:  Left knee pain EXAM: LEFT KNEE - 3 VIEW COMPARISON:  11/07/2022 FINDINGS: Left total knee prosthesis is well seated without periprosthetic fracture or lucency. Patellar tendon is mildly thickened. Atherosclerotic changes seen throughout visualized arterial segments. IMPRESSION: 1. Uncomplicated left total knee prosthesis. 2. Mild thickening of the patellar tendon. Please correlate clinically for possibility of tendinitis. Electronically Signed   By: Acquanetta Belling M.D.   On: 07/10/2023 08:03   CT Head Wo Contrast  Result Date: 07/09/2023 CLINICAL DATA:  Mental status change, unknown cause EXAM: CT HEAD WITHOUT CONTRAST TECHNIQUE: Contiguous axial images were obtained from the base of the skull through the vertex without intravenous contrast. RADIATION DOSE REDUCTION: This exam was performed according to the departmental dose-optimization program which includes automated exposure control, adjustment of the mA and/or kV according to patient size  and/or use of iterative reconstruction technique. COMPARISON:  CT head 09/27/2022. FINDINGS: Brain: No evidence of acute infarction, hemorrhage, hydrocephalus, extra-axial collection or mass lesion/mass effect. Vascular: No hyperdense vessel identified. Skull: No acute fracture. Sinuses/Orbits: Clear sinuses.  No acute orbital findings Other: No mastoid effusions. IMPRESSION: No evidence of acute intracranial abnormality. Electronically Signed   By: Feliberto Harts M.D.   On: 07/09/2023 18:13   DG Chest Port 1 View  Result Date: 07/09/2023 CLINICAL DATA:  Confusion EXAM: PORTABLE CHEST 1 VIEW COMPARISON:  None Available. FINDINGS: Patchy bilateral airspace disease noted in the right suprahilar and infrahilar region and in the left lower lobe. Heart and mediastinal contours within normal limits. Aortic atherosclerosis. No effusions or acute bony abnormality. IMPRESSION: Patchy bilateral airspace disease.  Cannot exclude pneumonia. Electronically Signed   By: Charlett Nose M.D.   On: 07/09/2023 17:07        Scheduled Meds:  atorvastatin  40 mg Oral Daily   azithromycin  500 mg Oral Daily   clopidogrel  75 mg Oral Daily   diclofenac Sodium  2 g Topical QID   enoxaparin (LOVENOX) injection  40 mg Subcutaneous Q24H   mirtazapine  7.5 mg Oral QHS   sodium chloride flush  3 mL Intravenous Q12H   Continuous Infusions:  sodium chloride 50 mL/hr at 07/10/23 1248   cefTRIAXone (ROCEPHIN)  IV       LOS: 1 day    Time spent: 45 minutes spent on chart review, discussion with nursing staff, consultants, updating family and interview/physical exam; more than 50% of that time was spent in counseling and/or coordination of care.    Joseph Art, DO [facility] Available via Epic secure chat 7am-7pm After these hours, please refer to coverage provider listed on [email or web address] 07/10/2023, 12:51 PM

## 2023-07-11 DIAGNOSIS — R6520 Severe sepsis without septic shock: Secondary | ICD-10-CM | POA: Diagnosis not present

## 2023-07-11 DIAGNOSIS — A419 Sepsis, unspecified organism: Secondary | ICD-10-CM | POA: Diagnosis not present

## 2023-07-11 DIAGNOSIS — I2510 Atherosclerotic heart disease of native coronary artery without angina pectoris: Secondary | ICD-10-CM | POA: Diagnosis not present

## 2023-07-11 DIAGNOSIS — N390 Urinary tract infection, site not specified: Secondary | ICD-10-CM

## 2023-07-11 DIAGNOSIS — G9341 Metabolic encephalopathy: Secondary | ICD-10-CM | POA: Diagnosis not present

## 2023-07-11 LAB — BASIC METABOLIC PANEL
Anion gap: 10 (ref 5–15)
BUN: 7 mg/dL — ABNORMAL LOW (ref 8–23)
CO2: 23 mmol/L (ref 22–32)
Calcium: 8.7 mg/dL — ABNORMAL LOW (ref 8.9–10.3)
Chloride: 104 mmol/L (ref 98–111)
Creatinine, Ser: 0.85 mg/dL (ref 0.44–1.00)
GFR, Estimated: >60 mL/min (ref >60)
Glucose, Bld: 89 mg/dL (ref 70–99)
Potassium: 3.7 mmol/L (ref 3.5–5.1)
Sodium: 137 mmol/L (ref 135–145)

## 2023-07-11 LAB — CBC
HCT: 33.3 % — ABNORMAL LOW (ref 36.0–46.0)
Hemoglobin: 10.8 g/dL — ABNORMAL LOW (ref 12.0–15.0)
MCH: 30.5 pg (ref 26.0–34.0)
MCHC: 32.4 g/dL (ref 30.0–36.0)
MCV: 94.1 fL (ref 80.0–100.0)
Platelets: 183 10*3/uL (ref 150–400)
RBC: 3.54 MIL/uL — ABNORMAL LOW (ref 3.87–5.11)
RDW: 11.9 % (ref 11.5–15.5)
WBC: 9.5 10*3/uL (ref 4.0–10.5)
nRBC: 0 % (ref 0.0–0.2)

## 2023-07-11 LAB — PROCALCITONIN: Procalcitonin: 0.38 ng/mL

## 2023-07-11 MED ORDER — CEPHALEXIN 250 MG PO CAPS: 250.0000 mg | ORAL_CAPSULE | Freq: Four times a day (QID) | ORAL | 0 refills | Status: AC

## 2023-07-11 MED ORDER — MIRTAZAPINE 7.5 MG PO TABS: 7.5000 mg | ORAL_TABLET | Freq: Every day | ORAL | 0 refills | Status: DC

## 2023-07-11 MED ORDER — AZITHROMYCIN 500 MG PO TABS: 500.0000 mg | ORAL_TABLET | Freq: Every day | ORAL | 0 refills | Status: DC

## 2023-07-11 NOTE — Evaluation (Signed)
Physical Therapy Evaluation Patient Details Name: Nancy Vasquez MRN: [id number] DOB: 27-Apr-1941 Today's Date: 07/11/2023  History of Present Illness  82 yo female presents to [facility] on 7/11 with weakness, incontinence, AMS. Workup fpr SIRS, source unclear. PMH Includes HLD, CAD, TIA, OA.  Clinical Impression   Pt presents with generalized weakness, impaired balance, nausea and dizziness with mobility (BP 100/60 post-gait), and decreased activity tolerance. Pt to benefit from acute PT to address deficits. Pt ambulated short room distance requiring RW and light physical assist to steady, pt limited in distance by dizziness and nausea which improved with rest. Likely will need HH services post-acutely, her family can assist as needed once d/c. PT to progress mobility as tolerated, and will continue to follow acutely.          Assistance Recommended at Discharge Frequent or constant Supervision/Assistance  If plan is discharge home, recommend the following:  Can travel by private vehicle  A little help with bathing/dressing/bathroom;A little help with walking and/or transfers        Equipment Recommendations None recommended by PT  Recommendations for Other Services       Functional Status Assessment Patient has had a recent decline in their functional status and demonstrates the ability to make significant improvements in function in a reasonable and predictable amount of time.     Precautions / Restrictions Precautions Precautions: Fall Restrictions Weight Bearing Restrictions: No      Mobility  Bed Mobility Overal bed mobility: Needs Assistance Bed Mobility: Supine to Sit     Supine to sit: Min assist, HOB elevated     General bed mobility comments: assist for trunk elevation    Transfers Overall transfer level: Needs assistance Equipment used: Rolling walker (2 wheels) Transfers: Sit to/from Stand Sit to Stand: Min assist           General transfer comment:  assist for rise and steady    Ambulation/Gait Ambulation/Gait assistance: Min assist Gait Distance (Feet): 15 Feet Assistive device: Rolling walker (2 wheels) Gait Pattern/deviations: Step-through pattern, Decreased stride length Gait velocity: decr     General Gait Details: slight steadying assist, cues for RW use (cane attempted but not supportive enough for pt). limited by dizziness  Stairs            Wheelchair Mobility     Tilt Bed    Modified Rankin (Stroke Patients Only)       Balance Overall balance assessment: Needs assistance Sitting-balance support: No upper extremity supported, Feet supported Sitting balance-Leahy Scale: Fair     Standing balance support: Bilateral upper extremity supported, During functional activity Standing balance-Leahy Scale: Poor Standing balance comment: reliant on external assist                             Pertinent Vitals/Pain Pain Assessment Pain Assessment: Faces Faces Pain Scale: Hurts little more Pain Location: L knee Pain Descriptors / Indicators: Sore, Discomfort Pain Intervention(s): Monitored during session, Limited activity within patient's tolerance, Repositioned    Home Living Family/patient expects to be discharged to:: Private residence Living Arrangements: Spouse/significant other Available Help at Discharge: Family;Available 24 hours/day Type of Home: House Home Access: Stairs to enter     Alternate Level Stairs-Number of Steps: 16 (8+8) - split level Home Layout: Multi-level Home Equipment: Cane - single point;Grab bars - tub/shower Additional Comments: has RW from friend if needed    Prior Function Prior Level of Function : Driving;Independent/Modified  Independent             Mobility Comments: uses SPC, no falls ADLs Comments: independent     Hand Dominance   Dominant Hand: Right    Extremity/Trunk Assessment   Upper Extremity Assessment Upper Extremity Assessment:  Defer to OT evaluation    Lower Extremity Assessment Lower Extremity Assessment: Generalized weakness    Cervical / Trunk Assessment Cervical / Trunk Assessment: Normal  Communication   Communication: No difficulties  Cognition Arousal/Alertness: Awake/alert Behavior During Therapy: WFL for tasks assessed/performed, Flat affect Overall Cognitive Status: Within Functional Limits for tasks assessed                                 General Comments: drowsy        General Comments General comments (skin integrity, edema, etc.): dizziness and nausea with gait, BP 100/60 post-gait    Exercises     Assessment/Plan    PT Assessment Patient needs continued PT services  PT Problem List Decreased strength;Decreased mobility;Decreased activity tolerance;Decreased balance;Decreased knowledge of use of DME;Pain;Cardiopulmonary status limiting activity       PT Treatment Interventions DME instruction;Therapeutic activities;Gait training;Therapeutic exercise;Patient/family education;Balance training;Stair training;Functional mobility training;Neuromuscular re-education    PT Goals (Current goals can be found in the Care Plan section)  Acute Rehab PT Goals PT Goal Formulation: With patient Time For Goal Achievement: 07/25/23 Potential to Achieve Goals: Good    Frequency Min 3X/week     Co-evaluation               AM-PAC PT "6 Clicks" Mobility  Outcome Measure Help needed turning from your back to your side while in a flat bed without using bedrails?: A Little Help needed moving from lying on your back to sitting on the side of a flat bed without using bedrails?: A Little Help needed moving to and from a bed to a chair (including a wheelchair)?: A Little Help needed standing up from a chair using your arms (e.g., wheelchair or bedside chair)?: A Little Help needed to walk in hospital room?: A Little Help needed climbing 3-5 steps with a railing? : A Little 6  Click Score: 18    End of Session   Activity Tolerance: Patient tolerated treatment well Patient left: in chair;with call bell/phone within reach;with family/visitor present Nurse Communication: Mobility status PT Visit Diagnosis: Muscle weakness (generalized) (M62.81);Other abnormalities of gait and mobility (R26.89)    Time: 4098-1191 PT Time Calculation (min) (ACUTE ONLY): 26 min   Charges:   PT Evaluation $PT Eval Low Complexity: 1 Low PT Treatments $Therapeutic Activity: 8-22 mins PT General Charges $$ ACUTE PT VISIT: 1 Visit         Marye Round, PT DPT Acute Rehabilitation Services Secure Chat Preferred  Office [phone]   Nancy Vasquez 07/11/2023, 10:29 AM

## 2023-07-11 NOTE — Evaluation (Signed)
Occupational Therapy Evaluation Patient Details Name: NADELYN CHRISP MRN: [id number] DOB: 1941-08-27 Today's Date: 07/11/2023   History of Present Illness 82 yo female presents to [facility] on 7/11 with weakness, incontinence, AMS. Workup fpr SIRS, source unclear. PMH Includes HLD, CAD, TIA, OA.   Clinical Impression   Pt is typically independent in ADL and mobility, drives, very active with her grandchildren and great grandchildren. Today she is min A for transfers and mobility and is actively reaching for external sources of stability and safety. She was able to perform a seated sponge bath at the sink with min A to don mesh underwear at the end of bath over sticky socks. She was set up for seated ADL at this time for both UB and LB. At this time recommending HHOT post-acute to maximize safety and independence in ADL and functional transfers as well as address home falls assessment and IADL in home setting (she especially loves cooking for her family) OT will continue to follow acutely and next session continue OOB and challenge standing balance during grooming more.      Recommendations for follow up therapy are one component of a multi-disciplinary discharge planning process, led by the attending physician.  Recommendations may be updated based on patient status, additional functional criteria and insurance authorization.   Assistance Recommended at Discharge Frequent or constant Supervision/Assistance  Patient can return home with the following A little help with walking and/or transfers;A little help with bathing/dressing/bathroom;Assistance with cooking/housework;Assist for transportation;Help with stairs or ramp for entrance    Functional Status Assessment  Patient has had a recent decline in their functional status and demonstrates the ability to make significant improvements in function in a reasonable and predictable amount of time.  Equipment Recommendations  BSC/3in1 (to be used as  shower chair)    Recommendations for Other Services PT consult     Precautions / Restrictions Precautions Precautions: Fall Restrictions Weight Bearing Restrictions: No      Mobility Bed Mobility Overal bed mobility: Needs Assistance Bed Mobility: Supine to Sit     Supine to sit: Min assist, HOB elevated     General bed mobility comments: in recliner at beginning and end of session    Transfers Overall transfer level: Needs assistance Equipment used: 1 person hand held assist Transfers: Sit to/from Stand Sit to Stand: Min assist           General transfer comment: assist for rise and steady      Balance Overall balance assessment: Needs assistance Sitting-balance support: No upper extremity supported, Feet supported Sitting balance-Leahy Scale: Fair     Standing balance support: Bilateral upper extremity supported, During functional activity Standing balance-Leahy Scale: Poor Standing balance comment: reliant on external assist                           ADL either performed or assessed with clinical judgement   ADL Overall ADL's : Needs assistance/impaired     Grooming: Set up;Sitting Grooming Details (indicate cue type and reason): sink level, seated for safety Upper Body Bathing: Supervision/ safety;Sitting Upper Body Bathing Details (indicate cue type and reason): very thorough Lower Body Bathing: Supervison/ safety;Sitting/lateral leans Lower Body Bathing Details (indicate cue type and reason): seated at sink Upper Body Dressing : Supervision/safety;Sitting   Lower Body Dressing: Minimal assistance;Sit to/from stand Lower Body Dressing Details (indicate cue type and reason): a little help to get mesh underwear over sticky socks Toilet Transfer: Minimal assistance  Toilet Transfer Details (indicate cue type and reason): no DME, Pt reaching for external assist Toileting- Clothing Manipulation and Hygiene: Min guard;Sit to/from stand        Functional mobility during ADLs: Minimal assistance (1 person HHA)       Vision Baseline Vision/History: 1 Wears glasses Ability to See in Adequate Light: 0 Adequate Patient Visual Report: No change from baseline Vision Assessment?: No apparent visual deficits     Perception     Praxis      Pertinent Vitals/Pain Pain Assessment Pain Assessment: Faces Faces Pain Scale: Hurts a little bit Pain Location: L knee Pain Descriptors / Indicators: Sore, Discomfort Pain Intervention(s): Monitored during session, Repositioned     Hand Dominance Right   Extremity/Trunk Assessment Upper Extremity Assessment Upper Extremity Assessment: Overall WFL for tasks assessed   Lower Extremity Assessment Lower Extremity Assessment: Defer to PT evaluation   Cervical / Trunk Assessment Cervical / Trunk Assessment: Normal   Communication Communication Communication: No difficulties   Cognition Arousal/Alertness: Awake/alert Behavior During Therapy: WFL for tasks assessed/performed, Flat affect Overall Cognitive Status: Within Functional Limits for tasks assessed                                 General Comments: drowsy     General Comments  husband present at beginning of session and end of session, no nausea during session    Exercises     Shoulder Instructions      Home Living Family/patient expects to be discharged to:: Private residence Living Arrangements: Spouse/significant other Available Help at Discharge: Family;Available 24 hours/day Type of Home: House Home Access: Stairs to enter [employer] of Steps: 1 Entrance Stairs-Rails: None Home Layout: Multi-level Alternate Level Stairs-Number of Steps: 16 (8+8) - split level Alternate Level Stairs-Rails: Right;Left;Can reach both Bathroom Shower/Tub: Chief Strategy Officer: Handicapped height     Home Equipment: Cane - single point;Grab bars - tub/shower   Additional Comments: has  RW from friend if needed      Prior Functioning/Environment Prior Level of Function : Driving;Independent/Modified Independent             Mobility Comments: uses SPC, no falls ADLs Comments: independent        OT Problem List: Decreased activity tolerance;Impaired balance (sitting and/or standing)      OT Treatment/Interventions: Self-care/ADL training;Energy conservation;DME and/or AE instruction;Therapeutic activities;Patient/family education;Balance training    OT Goals(Current goals can be found in the care plan section) Acute Rehab OT Goals Patient Stated Goal: get home today OT Goal Formulation: With patient Time For Goal Achievement: 07/25/23 Potential to Achieve Goals: Good ADL Goals Pt Will Perform Grooming: with modified independence;standing Pt Will Perform Upper Body Dressing: with modified independence;sitting Pt Will Perform Lower Body Dressing: with modified independence;sit to/from stand Pt Will Transfer to Toilet: with modified independence;ambulating Pt Will Perform Toileting - Clothing Manipulation and hygiene: with modified independence;sit to/from stand  OT Frequency: Min 2X/week    Co-evaluation              AM-PAC OT "6 Clicks" Daily Activity     Outcome Measure Help from another person eating meals?: None Help from another person taking care of personal grooming?: A Little Help from another person toileting, which includes using toliet, bedpan, or urinal?: A Little Help from another person bathing (including washing, rinsing, drying)?: A Little Help from another person to put on and taking off  regular upper body clothing?: None Help from another person to put on and taking off regular lower body clothing?: A Little 6 Click Score: 20   End of Session Equipment Utilized During Treatment: Gait belt Nurse Communication: Mobility status  Activity Tolerance: Patient tolerated treatment well Patient left: in chair;with call bell/phone within  reach;with family/visitor present  OT Visit Diagnosis: Unsteadiness on feet (R26.81);Muscle weakness (generalized) (M62.81)                Time: 1610-9604 OT Time Calculation (min): 41 min Charges:  OT General Charges $OT Visit: 1 Visit OT Evaluation $OT Eval Moderate Complexity: 1 Mod OT Treatments $Self Care/Home Management : 8-22 mins $Therapeutic Activity: 8-22 mins Nyoka Cowden OTR/L Acute Rehabilitation Services Office: [phone]  Evern Bio [facility] 07/11/2023, 2:07 PM

## 2023-07-11 NOTE — Plan of Care (Signed)
  Problem: Activity: Goal: Ability to tolerate increased activity will improve Outcome: Progressing   Problem: Respiratory: Goal: Ability to maintain adequate ventilation will improve Outcome: Progressing   Problem: Coping: Goal: Level of anxiety will decrease Outcome: Progressing   

## 2023-07-11 NOTE — Discharge Summary (Signed)
Physician Discharge Summary  [name]:[id number] DOB: April 08, 1941 DOA: 07/09/2023  PCP: Hoy Register, MD  Admit date: 07/09/2023 Discharge date: 07/11/2023  Admitted From: Home Disposition: Home  Recommendations for Outpatient Follow-up:  Follow up with PCP in 1-2 weeks  Home Health: None Equipment/Devices: None  Discharge Condition: Stable CODE STATUS: Full Diet recommendation: Regular diet as tolerated  Brief/Interim Summary: Nancy Vasquez is a pleasant 82 y.o. female with medical history significant for CAD, TIA, hyperlipidemia, and arthritis who presents to the emergency department with fever, somnolence, and confusion.   Patient admitted with SIRS unclear source, presumed pneumonia on azithromycin and ceftriaxone patient resolved quite quickly.  Patient did confirm urinary symptoms over the past week with increased frequency and urgency and likely consistent with UTI.  Given patient's mental status is back to baseline she is otherwise stable and agreeable for discharge home, family at bedside concurs.  Will continue patient on coverage for pneumonia as well as UTI.  Close follow-up with PCP as scheduled  Discharge Diagnoses:  Principal Problem:   Sepsis (HCC) Active Problems:   Coronary artery disease   TIA (transient ischemic attack)   Acute encephalopathy    Discharge Instructions   Allergies as of 07/11/2023       Reactions   Ibuprofen Nausea And Vomiting, Other (See Comments)   "burns stomach"   Oxycodone Nausea Only   "Burns stomach"        Medication List     STOP taking these medications    ofloxacin 0.3 % OTIC solution Commonly known as: FLOXIN   traMADol 50 MG tablet Commonly known as: ULTRAM       TAKE these medications    atorvastatin 40 MG tablet Commonly known as: LIPITOR Take 1 tablet (40 mg total) by mouth daily.   azithromycin 500 MG tablet Commonly known as: ZITHROMAX Take 1 tablet (500 mg total) by mouth  daily. Start taking on: July 12, 2023   cephALEXin 250 MG capsule Commonly known as: KEFLEX Take 1 capsule (250 mg total) by mouth 4 (four) times daily for 3 days.   clopidogrel 75 MG tablet Commonly known as: PLAVIX Take 1 tablet (75 mg total) by mouth daily.   mirtazapine 7.5 MG tablet Commonly known as: REMERON Take 1 tablet (7.5 mg total) by mouth at bedtime.   Vitamin D (Ergocalciferol) 1.25 MG (50000 UNIT) Caps capsule Commonly known as: DRISDOL Take 50,000 Units by mouth once a week.        Allergies  Allergen Reactions   Ibuprofen Nausea And Vomiting and Other (See Comments)    "burns stomach"   Oxycodone Nausea Only    "Burns stomach"    Consultations: None   Procedures/Studies: MR BRAIN WO CONTRAST  Result Date: 07/10/2023 CLINICAL DATA:  Mental status change with unknown cause EXAM: MRI HEAD WITHOUT CONTRAST TECHNIQUE: Multiplanar, multiecho pulse sequences of the brain and surrounding structures were obtained without intravenous contrast. COMPARISON:  Head CT from yesterday.  Brain MRI 11/27/2022 FINDINGS: Brain: Mild for age cerebral volume loss. Chronic small vessel ischemia that is mild in the cerebral white matter and mild-to-moderate in the pons. No acute or subacute infarct, hemorrhage, hydrocephalus, or mass. Vascular: Major flow voids are preserved Skull and upper cervical spine: No focal marrow lesion. C3-4 facet ankylosis. Notable left facet degeneration at C2-3 with possible marrow edema. Sinuses/Orbits: No acute finding. Other: Bilobed mass in the left parotid measuring 2.2 cm in craniocaudal span, unchanged from comparison brain MRI IMPRESSION: 1.  No acute finding. 2. Mild-to-moderate chronic small vessel ischemia. 3. 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 Electronically Signed   By: Tiburcio Pea M.D.   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 Patellar tendon is mildly thickened. Atherosclerotic changes seen throughout visualized arterial segments. IMPRESSION: 1. Uncomplicated left total knee prosthesis. 2. Mild thickening of the patellar tendon. Please correlate clinically for possibility of tendinitis. Electronically Signed   By: Acquanetta Belling M.D.   On: 07/10/2023 08:03   CT Head Wo Contrast  Result Date: 07/09/2023 CLINICAL DATA:  Mental status change, unknown cause EXAM: CT HEAD WITHOUT CONTRAST TECHNIQUE: Contiguous axial images were obtained from the base of the skull through the vertex without intravenous contrast. RADIATION DOSE REDUCTION: This exam was performed according to the departmental dose-optimization program which includes automated exposure control, adjustment of the mA and/or kV according to patient size and/or use of iterative reconstruction technique. COMPARISON:  CT head 09/27/2022. FINDINGS: Brain: No evidence of acute infarction, hemorrhage, hydrocephalus, extra-axial collection or mass lesion/mass effect. Vascular: No hyperdense vessel identified. Skull: No acute fracture. Sinuses/Orbits: Clear sinuses.  No acute orbital findings Other: No mastoid effusions. IMPRESSION: No evidence of acute intracranial abnormality. Electronically Signed   By: Feliberto Harts M.D.   On: 07/09/2023 18:13   DG Chest Port 1 View  Result Date: 07/09/2023 CLINICAL DATA:  Confusion EXAM: PORTABLE CHEST 1 VIEW COMPARISON:  None Available. FINDINGS: Patchy bilateral airspace disease noted in the right suprahilar and infrahilar region and in the left lower lobe. Heart and mediastinal contours within normal limits. Aortic atherosclerosis. No effusions or acute bony abnormality. IMPRESSION: Patchy bilateral airspace disease.  Cannot exclude pneumonia. Electronically Signed   By: Charlett Nose M.D.    On: 07/09/2023 17:07     Subjective: No acute issues or events overnight back to baseline per family at bedside, denies nausea vomiting diarrhea constipation high fevers chills or chest pain   Discharge Exam: Vitals:   07/11/23 0510 07/11/23 0751  BP: 126/61 127/64  Pulse: 65 69  Resp: 16   Temp: 98.2 F (36.8 C) 99.7 F (37.6 C)  SpO2: 100% 97%   Vitals:   07/10/23 1954 07/11/23 0204 07/11/23 0510 07/11/23 0751  BP: (!) 99/53 119/65 126/61 127/64  Pulse: 76 65 65 69  Resp: 18 16 16    Temp: 98.4 F (36.9 C) 98.3 F (36.8 C) 98.2 F (36.8 C) 99.7 F (37.6 C)  TempSrc: Oral Oral Oral Oral  SpO2: 98% 98% 100% 97%  Weight:      Height:        General: Pt is alert, awake, not in acute distress Cardiovascular: RRR, S1/S2 +, no rubs, no gallops Respiratory: CTA bilaterally, no wheezing, no rhonchi Abdominal: Soft, NT, ND, bowel sounds + Extremities: no edema, no cyanosis  The results of significant diagnostics from this hospitalization (including imaging, microbiology, ancillary and laboratory) are listed below for reference.     Microbiology: Recent Results (from the past 240 hour(s))  Blood culture (routine x 2)     Status: None (Preliminary result)   Collection Time: 07/09/23  5:04 PM   Specimen: BLOOD  Result Value Ref Range Status   Specimen Description BLOOD RIGHT ANTECUBITAL  Final   Special Requests  Final    BOTTLES DRAWN AEROBIC AND ANAEROBIC Blood Culture results may not be optimal due to an excessive volume of blood received in culture bottles   Culture   Final    NO GROWTH 2 DAYS Performed at [facility], [address], [place], [place] [address]    Report Status PENDING  Incomplete  Blood culture (routine x 2)     Status: None (Preliminary result)   Collection Time: 07/09/23  5:21 PM   Specimen: BLOOD  Result Value Ref Range Status   Specimen Description BLOOD RIGHT ANTECUBITAL  Final   Special Requests   Final    BOTTLES DRAWN AEROBIC AND  ANAEROBIC Blood Culture results may not be optimal due to an excessive volume of blood received in culture bottles   Culture   Final    NO GROWTH 2 DAYS Performed at [facility], [address], [place], [place] [address]    Report Status PENDING  Incomplete  SARS Coronavirus 2 by RT PCR (hospital order, performed in [facility] hospital lab) *cepheid single result test* Anterior Nasal Swab     Status: None   Collection Time: 07/09/23  8:00 PM   Specimen: Anterior Nasal Swab  Result Value Ref Range Status   SARS Coronavirus 2 by RT PCR NEGATIVE NEGATIVE Final    Comment: Performed at [facility], [address], [place], [place] [address]  Respiratory (~20 pathogens) panel by PCR     Status: None   Collection Time: 07/10/23 12:52 PM   Specimen: Nasopharyngeal Swab; Respiratory  Result Value Ref Range Status   Adenovirus NOT DETECTED NOT DETECTED Final   Coronavirus 229E NOT DETECTED NOT DETECTED Final    Comment: (NOTE) The Coronavirus on the Respiratory Panel, DOES NOT test for the novel  Coronavirus (2019 nCoV)    Coronavirus HKU1 NOT DETECTED NOT DETECTED Final   Coronavirus NL63 NOT DETECTED NOT DETECTED Final   Coronavirus OC43 NOT DETECTED NOT DETECTED Final   Metapneumovirus NOT DETECTED NOT DETECTED Final   Rhinovirus / Enterovirus NOT DETECTED NOT DETECTED Final   Influenza A NOT DETECTED NOT DETECTED Final   Influenza B NOT DETECTED NOT DETECTED Final   Parainfluenza Virus 1 NOT DETECTED NOT DETECTED Final   Parainfluenza Virus 2 NOT DETECTED NOT DETECTED Final   Parainfluenza Virus 3 NOT DETECTED NOT DETECTED Final   Parainfluenza Virus 4 NOT DETECTED NOT DETECTED Final   Respiratory Syncytial Virus NOT DETECTED NOT DETECTED Final   Bordetella pertussis NOT DETECTED NOT DETECTED Final   Bordetella Parapertussis NOT DETECTED NOT DETECTED Final   Chlamydophila pneumoniae NOT DETECTED NOT DETECTED Final   Mycoplasma pneumoniae NOT DETECTED NOT DETECTED Final     Comment: Performed at [facility], [address] [place], [place] [address]   Labs:  Basic Metabolic Panel: Recent Labs  Lab 07/09/23 1653 07/10/23 0717 07/11/23 0621  NA 136 136 137  K 3.5 3.7 3.7  CL 103 104 104  CO2 23 24 23   GLUCOSE 110* 118* 89  BUN 10 8 7*  CREATININE 1.05* 0.84 0.85  CALCIUM 9.0 8.8* 8.7*   Liver Function Tests: Recent Labs  Lab 07/09/23 1653 07/10/23 0717  AST 19 19  ALT 15 14  ALKPHOS 56 50  BILITOT 1.6* 1.0  PROT 8.0 6.5  ALBUMIN 3.7 2.9*   Recent Labs  Lab 07/09/23 1653  AMMONIA 12   CBC: Recent Labs  Lab 07/09/23 1653 07/10/23 0717  07/11/23 0621  WBC 16.5* 13.7* 9.5  NEUTROABS 14.0*  --   --   HGB 12.6 11.0* 10.8*  HCT 39.2 33.9* 33.3*  MCV 92.9 93.9 94.1  PLT 212 175 183   Thyroid function studies Recent Labs    07/09/23 1653  TSH 0.801   Urinalysis    Component Value Date/Time   COLORURINE YELLOW 07/09/2023 1746   APPEARANCEUR CLEAR 07/09/2023 1746   LABSPEC 1.014 07/09/2023 1746   PHURINE 5.0 07/09/2023 1746   GLUCOSEU NEGATIVE 07/09/2023 1746   HGBUR SMALL (A) 07/09/2023 1746   BILIRUBINUR NEGATIVE 07/09/2023 1746   KETONESUR NEGATIVE 07/09/2023 1746   PROTEINUR NEGATIVE 07/09/2023 1746   UROBILINOGEN 0.2 11/19/2014 1832   NITRITE NEGATIVE 07/09/2023 1746   LEUKOCYTESUR NEGATIVE 07/09/2023 1746   Sepsis Labs Recent Labs  Lab 07/09/23 1653 07/10/23 0717 07/11/23 0621  WBC 16.5* 13.7* 9.5   Microbiology Recent Results (from the past 240 hour(s))  Blood culture (routine x 2)     Status: None (Preliminary result)   Collection Time: 07/09/23  5:04 PM   Specimen: BLOOD  Result Value Ref Range Status   Specimen Description BLOOD RIGHT ANTECUBITAL  Final   Special Requests   Final    BOTTLES DRAWN AEROBIC AND ANAEROBIC Blood Culture results may not be optimal due to an excessive volume of blood received in culture bottles   Culture   Final    NO GROWTH 2 DAYS Performed at [facility] Lab, [address] [place], [place] [address]    Report Status PENDING  Incomplete  Blood culture (routine x 2)     Status: None (Preliminary result)   Collection Time: 07/09/23  5:21 PM   Specimen: BLOOD  Result Value Ref Range Status   Specimen Description BLOOD RIGHT ANTECUBITAL  Final   Special Requests   Final    BOTTLES DRAWN AEROBIC AND ANAEROBIC Blood Culture results may not be optimal due to an excessive volume of blood received in culture bottles   Culture   Final    NO GROWTH 2 DAYS Performed at [facility] Lab, [address] [place], [place] [address]    Report Status PENDING  Incomplete  SARS Coronavirus 2 by RT PCR (hospital order, performed in [facility] hospital lab) *cepheid single result test* Anterior Nasal Swab     Status: None   Collection Time: 07/09/23  8:00 PM   Specimen: Anterior Nasal Swab  Result Value Ref Range Status   SARS Coronavirus 2 by RT PCR NEGATIVE NEGATIVE Final    Comment: Performed at [facility], [address] [place], [place] [address]  Respiratory (~20 pathogens) panel by PCR     Status: None   Collection Time: 07/10/23 12:52 PM   Specimen: Nasopharyngeal Swab; Respiratory  Result Value Ref Range Status   Adenovirus NOT DETECTED NOT DETECTED Final   Coronavirus 229E NOT DETECTED NOT DETECTED Final    Comment: (NOTE) The Coronavirus on the Respiratory Panel, DOES NOT test for the novel  Coronavirus (2019 nCoV)    Coronavirus HKU1 NOT DETECTED NOT DETECTED Final   Coronavirus NL63 NOT DETECTED NOT DETECTED Final   Coronavirus OC43 NOT DETECTED NOT DETECTED Final   Metapneumovirus NOT DETECTED NOT DETECTED Final   Rhinovirus / Enterovirus NOT DETECTED NOT DETECTED Final   Influenza A NOT DETECTED NOT DETECTED Final   Influenza B NOT DETECTED NOT DETECTED Final   Parainfluenza Virus 1 NOT DETECTED NOT DETECTED Final  Parainfluenza Virus 2 NOT DETECTED NOT DETECTED Final   Parainfluenza Virus 3 NOT DETECTED NOT  DETECTED Final   Parainfluenza Virus 4 NOT DETECTED NOT DETECTED Final   Respiratory Syncytial Virus NOT DETECTED NOT DETECTED Final   Bordetella pertussis NOT DETECTED NOT DETECTED Final   Bordetella Parapertussis NOT DETECTED NOT DETECTED Final   Chlamydophila pneumoniae NOT DETECTED NOT DETECTED Final   Mycoplasma pneumoniae NOT DETECTED NOT DETECTED Final    Comment: Performed at [facility], [address], [place], [place] [address]     Time coordinating discharge: Over 30 minutes  SIGNED:   Azucena Fallen, DO [facility] 07/11/2023, 1:56 PM Pager   If 7PM-7AM, please contact night-coverage [email or web address]

## 2023-07-13 ENCOUNTER — Telehealth: Payer: Self-pay

## 2023-07-13 LAB — CULTURE, BLOOD (ROUTINE X 2)

## 2023-07-13 NOTE — Transitions of Care (Post Inpatient/ED Visit) (Signed)
   07/13/2023  Name: Nancy Vasquez MRN: [id number] DOB: 22-Sep-1941  Today's TOC FU Call Status: Today's TOC FU Call Status:: Unsuccessul Call (1st Attempt) Unsuccessful Call (1st Attempt) Date: 07/13/23  Attempted to reach the patient regarding the most recent Inpatient/ED visit.  Follow Up Plan: Additional outreach attempts will be made to reach the patient to complete the Transitions of Care (Post Inpatient/ED visit) call.   The patient has an appointment with Dr Alvis Lemmings at [facility] tomorrow, 07/14/2023.   Signature  Audie Box

## 2023-07-14 ENCOUNTER — Ambulatory Visit: Payer: Medicare Other | Attending: Family Medicine | Admitting: Family Medicine

## 2023-07-14 ENCOUNTER — Telehealth: Payer: Self-pay

## 2023-07-14 VITALS — BP 139/78 | HR 66 | Ht 64.0 in | Wt 156.4 lb

## 2023-07-14 DIAGNOSIS — M1712 Unilateral primary osteoarthritis, left knee: Secondary | ICD-10-CM

## 2023-07-14 DIAGNOSIS — H9201 Otalgia, right ear: Secondary | ICD-10-CM

## 2023-07-14 DIAGNOSIS — R5381 Other malaise: Secondary | ICD-10-CM | POA: Diagnosis not present

## 2023-07-14 DIAGNOSIS — Z1159 Encounter for screening for other viral diseases: Secondary | ICD-10-CM

## 2023-07-14 DIAGNOSIS — R531 Weakness: Secondary | ICD-10-CM | POA: Diagnosis not present

## 2023-07-14 DIAGNOSIS — J189 Pneumonia, unspecified organism: Secondary | ICD-10-CM

## 2023-07-14 DIAGNOSIS — R5383 Other fatigue: Secondary | ICD-10-CM

## 2023-07-14 LAB — POCT URINALYSIS DIP (CLINITEK)
Bilirubin, UA: NEGATIVE
Blood, UA: NEGATIVE
Glucose, UA: NEGATIVE mg/dL
Ketones, POC UA: NEGATIVE mg/dL
Nitrite, UA: NEGATIVE
POC PROTEIN,UA: NEGATIVE
Spec Grav, UA: 1.025 (ref 1.010–1.025)
Urobilinogen, UA: 0.2 E.U./dL
pH, UA: 6 (ref 5.0–8.0)

## 2023-07-14 LAB — CULTURE, BLOOD (ROUTINE X 2)
Culture: NO GROWTH
Culture: NO GROWTH

## 2023-07-14 NOTE — Progress Notes (Signed)
Entire body in pine Weakness Fatigue

## 2023-07-14 NOTE — Progress Notes (Signed)
Subjective:  Patient ID: Nancy Vasquez, female    DOB: Aug 05, 1941  Age: 82 y.o. y.o. MRN: [id number]  CC: Hospitalization Follow-up   HPI Nancy Vasquez is a 82 y.o. year old female with a history of CAD (followed by Dr Sharyn Lull), TIA , left knee osteoarthritis (s/p left TKR),  right THR, hyperlipidemia, hypertension . Presents for transition of care with today after hospitalization from 07/09/2023 through 07/11/2023 for metabolic encephalopathy and SIRS of unclear source.  Chest x-ray revealed bilateral airspace disease suspicious for pneumonia.  CT head revealed no evidence of acute intracranial abnormality.  Blood cultures negative, urinalysis positive for leukocyte esterase, negative for nitrites, urine culture was not collected. Nancy Vasquez was discharged on azithromycin and Keflex.  Interval History: Discussed the use of AI scribe software for clinical note transcription with the patient, who gave verbal consent to proceed.  Nancy Vasquez was recently discharged from the hospital after being treated for sepsis and presumed pneumonia. Since discharge, the patient has been experiencing fatigue, weakness, and difficulty with mobility. The patient's daughter reports that the patient's strength has not returned and that Nancy Vasquez has been dropping things and struggling with stairs. The patient's appetite has also decreased.  Daughter feels Nancy Vasquez is not as active as Nancy Vasquez would usually be.  Today Nancy Vasquez will be taking her last dose of antibiotics.  In addition to these symptoms, the patient has been experiencing pain and a feeling of fullness in her ear. The patient reports that these symptoms began with an earache and have persisted.   Her left knee continues to hurt and I had referred her for PT but Nancy Vasquez declined going there as Nancy Vasquez had undergone PT at that same facility with no improvement in symptoms.  Pain has been present since her knee surgery. Nancy Vasquez has been applying Voltaren gel with improvement in symptoms.      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   Folliculitis 04/10/2023    Past Surgical History:  Procedure Laterality Date   ABDOMINAL HYSTERECTOMY     BREAST EXCISIONAL BIOPSY Left    CORONARY PRESSURE/FFR STUDY N/A 03/17/2017   Procedure: Intravascular Pressure Wire/FFR Study;  Surgeon: Rinaldo Cloud, MD;  Location: [facility] INVASIVE CV LAB;  Service: Cardiovascular;  Laterality: N/A;  mid LAD   LEFT HEART CATH AND CORONARY ANGIOGRAPHY N/A 03/17/2017   Procedure: Left Heart Cath and Coronary Angiography;  Surgeon: Rinaldo Cloud, MD;  Location: [facility] INVASIVE CV LAB;  Service: Cardiovascular;  Laterality: N/A;   TONSILLECTOMY     Removed as a child   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   Procedure: LEFT TOTAL KNEE ARTHROPLASTY;  Surgeon: Cammy Copa, MD;  Location: [facility] OR;  Service: Orthopedics;  Laterality: Left;    Family History  Problem Relation Age of Onset   Breast cancer Sister 67       metastatic breast ca    Social History   Socioeconomic History   Marital status: Married    Spouse name: Not on file   Number of children: Not on file   Years of education: Not on file   Highest education level: Not on file  Occupational History   Not on file  Tobacco Use   Smoking status: Never   Smokeless tobacco: Never  Vaping Use   Vaping status: Never Used  Substance and Sexual Activity   Alcohol use: Never  Drug use: Never   Sexual activity: Yes  Other Topics Concern   Not on file  Social History Narrative   Not on file   Social Determinants of Health   Financial Resource Strain: Low Risk  (04/23/2023)   Overall Financial Resource Strain (CARDIA)    Difficulty of Paying Living Expenses: Not hard at all  Food Insecurity: No Food Insecurity (07/09/2023)   Hunger Vital Sign    Worried About Running Out of Food in the Last Year: Never true    Ran Out of Food in the  Last Year: Never true  Transportation Needs: No Transportation Needs (07/09/2023)   PRAPARE - Administrator, Civil Service (Medical): No    Lack of Transportation (Non-Medical): No  Physical Activity: Insufficiently Active (04/23/2023)   Exercise Vital Sign    Days of Exercise per Week: 7 days    Minutes of Exercise per Session: 10 min  Stress: No Stress Concern Present (04/23/2023)   [employer] of Occupational Health - Occupational Stress Questionnaire    Feeling of Stress : Not at all  Social Connections: Socially Integrated (04/23/2023)   Social Connection and Isolation Panel [NHANES]    Frequency of Communication with Friends and Family: More than three times a week    Frequency of Social Gatherings with Friends and Family: More than three times a week    Attends Religious Services: More than 4 times per year    Active Member of [employer] or Organizations: Yes    Attends Engineer, structural: More than 4 times per year    Marital Status: Married    Allergies  Allergen Reactions   Ibuprofen Nausea And Vomiting and Other (See Comments)    "burns stomach"   Oxycodone Nausea Only    "Burns stomach"    Outpatient Medications Prior to Visit  Medication Sig Dispense Refill   atorvastatin (LIPITOR) 40 MG tablet Take 1 tablet (40 mg total) by mouth daily. 90 tablet 1   azithromycin (ZITHROMAX) 500 MG tablet Take 1 tablet (500 mg total) by mouth daily. 3 tablet 0   cephALEXin (KEFLEX) 250 MG capsule Take 1 capsule (250 mg total) by mouth 4 (four) times daily for 3 days. 12 capsule 0   clopidogrel (PLAVIX) 75 MG tablet Take 1 tablet (75 mg total) by mouth daily. 90 tablet 1   mirtazapine (REMERON) 7.5 MG tablet Take 1 tablet (7.5 mg total) by mouth at bedtime. 30 tablet 0   Vitamin D, Ergocalciferol, (DRISDOL) 1.25 MG (50000 UNIT) CAPS capsule Take 50,000 Units by mouth once a week.     No facility-administered medications prior to visit.     ROS Review of  Systems  Constitutional:  Positive for appetite change and fatigue. Negative for activity change.  HENT:  Positive for ear pain. Negative for sinus pressure and sore throat.   Respiratory:  Negative for chest tightness, shortness of breath and wheezing.   Cardiovascular:  Negative for chest pain and palpitations.  Gastrointestinal:  Negative for abdominal distention, abdominal pain and constipation.  Genitourinary: Negative.   Musculoskeletal:        See HPI  Neurological:  Positive for weakness.  Psychiatric/Behavioral:  Negative for behavioral problems and dysphoric mood.     Objective:  BP 139/78   Pulse 66   Ht 5\' 4"  (1.626 m)   Wt 156 lb 6.4 oz (70.9 kg)   SpO2 98%   BMI 26.85 kg/m      07/14/2023  1:55 PM 07/11/2023    7:51 AM 07/11/2023    5:10 AM  BP/Weight  Systolic BP 139 127 126  Diastolic BP 78 64 61  Wt. (Lbs) 156.4    BMI 26.85 kg/m2        Physical Exam Constitutional:      Appearance: Nancy Vasquez is well-developed.  HENT:     Right Ear: There is impacted cerumen.     Left Ear: There is impacted cerumen.     Mouth/Throat:     Mouth: Mucous membranes are moist.  Cardiovascular:     Rate and Rhythm: Normal rate.     Heart sounds: Normal heart sounds. No murmur heard. Pulmonary:     Effort: Pulmonary effort is normal.     Breath sounds: Normal breath sounds. No wheezing or rales.  Chest:     Chest wall: No tenderness.  Abdominal:     General: Bowel sounds are normal. There is no distension.     Palpations: Abdomen is soft. There is no mass.     Tenderness: There is no abdominal tenderness.  Musculoskeletal:        General: Normal range of motion.     Right lower leg: No edema.     Left lower leg: No edema.     Comments: Vertical healed surgical scar on left knee with slight edema No tenderness on range of motion  Lymphadenopathy:     Cervical: No cervical adenopathy.  Neurological:     Mental Status: Nancy Vasquez is alert and oriented to person, place, and  time.  Psychiatric:        Mood and Affect: Mood normal.        Latest Ref Rng & Units 07/11/2023    6:21 AM 07/10/2023    7:17 AM 07/09/2023    4:53 PM  CMP  Glucose 70 - 99 mg/dL 89  161  096   BUN 8 - 23 mg/dL 7  8  10    Creatinine 0.44 - 1.00 mg/dL 0.45  4.09  8.11   Sodium 135 - 145 mmol/L 137  136  136   Potassium 3.5 - 5.1 mmol/L 3.7  3.7  3.5   Chloride 98 - 111 mmol/L 104  104  103   CO2 22 - 32 mmol/L 23  24  23    Calcium 8.9 - 10.3 mg/dL 8.7  8.8  9.0   Total Protein 6.5 - 8.1 g/dL  6.5  8.0   Total Bilirubin 0.3 - 1.2 mg/dL  1.0  1.6   Alkaline Phos 38 - 126 U/L  50  56   AST 15 - 41 U/L  19  19   ALT 0 - 44 U/L  14  15     Lipid Panel     Component Value Date/Time   CHOL 152 09/28/2022 0525   TRIG 40 09/28/2022 0525   HDL 68 09/28/2022 0525   CHOLHDL 2.2 09/28/2022 0525   VLDL 8 09/28/2022 0525   LDLCALC 76 09/28/2022 0525    CBC    Component Value Date/Time   WBC 9.5 07/11/2023 0621   RBC 3.54 (L) 07/11/2023 0621   HGB 10.8 (L) 07/11/2023 0621   HCT 33.3 (L) 07/11/2023 0621   PLT 183 07/11/2023 0621   MCV 94.1 07/11/2023 0621   MCH 30.5 07/11/2023 0621   MCHC 32.4 07/11/2023 0621   RDW 11.9 07/11/2023 0621   LYMPHSABS 1.1 07/09/2023 1653   MONOABS 1.3 (H) 07/09/2023 1653   EOSABS 0.0  07/09/2023 1653   BASOSABS 0.0 07/09/2023 1653    Lab Results  Component Value Date   HGBA1C 4.7 (L) 09/28/2022   Lab Results  Component Value Date   COLORU yellow 07/14/2023   CLARITYU clear 07/14/2023   GLUCOSEUR negative 07/14/2023   BILIRUBINUR negative 07/14/2023   SPECGRAV 1.025 07/14/2023   RBCUR negative 07/14/2023   PHUR 6.0 07/14/2023   PROTEINUR NEGATIVE 07/09/2023   UROBILINOGEN 0.2 07/14/2023   LEUKOCYTESUR Trace (A) 07/14/2023     Assessment & Plan:      Post-Hospitalization Sepsis:  -Recent hospitalization for presumed pneumonia and sepsis. Completed course of antibiotics. Currently experiencing generalized weakness, fatigue, and  decreased appetite. -Order blood work to check white blood cell count, anemia, kidney and liver functions. -Order urine test and culture. -Order repeat chest x-ray to ensure resolution of pneumonia.  Knee Pain and Swelling:  -History of knee surgery with current complaints of pain and swelling. Recent improvement with use of Voltaren gel. -Refer to physical therapy for gait training and knee exercises. -Consider referral to a different orthopedic surgeon if patient desires.  Left Ear Pain:  -Complaints of ear pain and fullness. Presence of wax in the ear canal. -Ear irrigation performed in the clinic  Deconditioning: - Generalized weakness and difficulty with mobility post-hospitalization. -Initiate physical therapy to improve strength and mobility. -Check with case manager regarding faster option for physical therapy (home vs outpatient).   General Health Maintenance / Followup Plans: -Consider travel plans and ensure any necessary medications are prescribed prior to travel. -Encourage hydration with Gatorade as tolerated. -Follow-up appointment to review results of lab work and imaging, and to assess response to physical therapy.           No orders of the defined types were placed in this encounter.   Follow-up: Return in about 6 months (around 01/14/2024) for Chronic medical conditions.       Hoy Register, MD, FAAFP. [facility] [place], [place] [phone]   07/14/2023, 6:13 PM

## 2023-07-14 NOTE — Transitions of Care (Post Inpatient/ED Visit) (Signed)
   07/14/2023  Name: Nancy Vasquez MRN: [id number] DOB: [birth year]  Today's TOC FU Call Status: Today's TOC FU Call Status:: Unsuccessful Call (2nd Attempt) Unsuccessful Call (1st Attempt) Date: 07/13/23 Unsuccessful Call (2nd Attempt) Date: 07/14/23  Attempted to reach the patient regarding the most recent Inpatient/ED visit.  Follow Up Plan: Additional outreach attempts will be made to reach the patient to complete the Transitions of Care (Post Inpatient/ED visit) call.   The patient has an appointment with Dr Alvis Lemmings at [facility] this afternoon.   Signature  Robyne Peers, RN

## 2023-07-14 NOTE — Patient Instructions (Signed)
Deconditioning Deconditioning refers to the changes in the body that occur during a period of time when you are not active (inactivity). The changes happen in the heart, lungs, and muscles. They make you feel tired and weak (fatigued) and decrease your ability to be active. The three stages of deconditioning include: Mild deconditioning. This is a change in your ability to do your usual exercise activities, such as running, biking, or swimming. Moderate deconditioning. This is a change in your ability to do normal everyday activities, such as walking, shopping for groceries, and doing chores. Severe deconditioning. In this stage, you may not be able to do minimal activity or usual self-care. What are the causes? Deconditioning can occur after only a few days of inactivity. The longer the period of inactivity, the more severe the deconditioning will be, and the longer it will take to return to your previous level of functioning. It can take three times longer to recover than the time period of inactivity. Deconditioning is caused by inactivity, often due to: Illnesses, such as cancer, stroke, heart attack, fibromyalgia, and chronic fatigue syndrome. Injuries, especially back injuries, broken bones, and injuries to soft tissues, such as ligaments and tendons. Hospitalization, even for just a day or two. Pregnancy, especially if long periods of bed rest are needed. What increases the risk? The following factors may make you more likely to develop this condition: Having obesity or poor nutrition. Having poor mobility before the period of inactivity. Being an older adult. Having depression. Having problems with thinking and learning (cognitive impairment). What are the signs or symptoms? Symptoms of this condition include: Weakness and tiredness. Shortness of breath with minor physical effort (exertion). A heartbeat that is faster than normal. You may not notice this without taking your pulse. Pain  or discomfort with activity. Decreased strength, endurance, and balance. Difficulty doing your usual forms of exercise. Difficulty doing activities of daily living, such as grocery shopping or chores. You may also have problems walking around the house and doing basic self-care, such as getting to the bathroom, preparing meals, or doing laundry. How is this diagnosed? This condition is diagnosed based on your medical history and a physical exam. During the physical exam, your health care provider will check for signs of deconditioning, such as: Decreased size of muscles. Decreased strength. Trouble with balance. Shortness of breath or a heart rate that is faster than normal after minor exertion. How is this treated? Treatment for this condition involves an exercise program in which activity is increased slowly. Your health care provider will tell you which exercises are right for you. The exercise program will likely include: Aerobic exercise. This type of exercise helps improve the functioning of the heart, lungs, and muscles. Strength training. This type of exercise helps increase muscle size and strength. Both of these types of exercise will improve your endurance. You may be referred to a physical therapist who can create a safe strengthening program for you to follow. Follow these instructions at home: Eating and drinking  Eat a healthy, well-balanced diet. This includes: Proteins, such as lean meats and fish, to build muscles. Fresh fruits and vegetables. Carbohydrates, such as whole grains, to boost energy. Drink enough fluid to keep your urine pale yellow. Activity  Follow the exercise program that is recommended by your health care provider or physical therapist. Do not increase your exercise any faster than directed. General instructions Take over-the-counter and prescription medicines only as told by your health care provider. Do not use any  products that contain nicotine or  tobacco. These products include cigarettes, chewing tobacco, and vaping devices, such as e-cigarettes. If you need help quitting, ask your health care provider. Keep all follow-up visits. This is important. Contact a health care provider if: You are not able to do the recommended exercise program. You are becoming more and more tired and weak. You become light-headed when rising to a sitting or standing position. Your level of endurance decreases after it has improved. Get help right away if: You have chest pain. You are very short of breath. You have any episodes of fainting. These symptoms may be an emergency. Get help right away. Call 911. Do not wait to see if the symptoms will go away. Do not drive yourself to the hospital. Summary Deconditioning refers to the changes in the body that occur during a period of inactivity. Deconditioning happens in the heart, lungs, and muscles. The changes make you feel tired and weak and decrease your ability to be active. Treatment for deconditioning involves an exercise program in which activity is increased slowly. This information is not intended to replace advice given to you by your health care provider. Make sure you discuss any questions you have with your health care provider. Document Revised: 10/07/2021 Document Reviewed: 10/07/2021 Elsevier Patient Education  2024 [employer].

## 2023-07-15 LAB — HCV AB W REFLEX TO QUANT PCR: HCV Ab: NONREACTIVE

## 2023-07-15 LAB — CMP14+EGFR
ALT: 26 IU/L (ref 0–32)
AST: 29 IU/L (ref 0–40)
Albumin: 3.9 g/dL (ref 3.8–4.8)
Alkaline Phosphatase: 68 IU/L (ref 44–121)
BUN/Creatinine Ratio: 13 (ref 12–28)
BUN: 8 mg/dL (ref 8–27)
Bilirubin Total: 0.4 mg/dL (ref 0.0–1.2)
CO2: 24 mmol/L (ref 20–29)
Calcium: 9.6 mg/dL (ref 8.7–10.3)
Chloride: 106 mmol/L (ref 96–106)
Creatinine, Ser: 0.64 mg/dL (ref 0.57–1.00)
Globulin, Total: 3.2 g/dL (ref 1.5–4.5)
Glucose: 84 mg/dL (ref 70–99)
Potassium: 3.8 mmol/L (ref 3.5–5.2)
Sodium: 143 mmol/L (ref 134–144)
Total Protein: 7.1 g/dL (ref 6.0–8.5)
eGFR: 90 mL/min/{1.73_m2} (ref >59)

## 2023-07-15 LAB — CBC WITH DIFFERENTIAL/PLATELET
Basophils Absolute: 0 10*3/uL (ref 0.0–0.2)
Basos: 0 %
EOS (ABSOLUTE): 0.1 10*3/uL (ref 0.0–0.4)
Eos: 2 %
Hematocrit: 34.3 % (ref 34.0–46.6)
Hemoglobin: 11 g/dL — ABNORMAL LOW (ref 11.1–15.9)
Immature Grans (Abs): 0.1 10*3/uL (ref 0.0–0.1)
Immature Granulocytes: 1 %
Lymphocytes Absolute: 2.4 10*3/uL (ref 0.7–3.1)
Lymphs: 31 %
MCH: 29.7 pg (ref 26.6–33.0)
MCHC: 32.1 g/dL (ref 31.5–35.7)
MCV: 93 fL (ref 79–97)
Monocytes Absolute: 0.7 10*3/uL (ref 0.1–0.9)
Monocytes: 9 %
Neutrophils Absolute: 4.5 10*3/uL (ref 1.4–7.0)
Neutrophils: 57 %
Platelets: 375 10*3/uL (ref 150–450)
RBC: 3.7 x10E6/uL — ABNORMAL LOW (ref 3.77–5.28)
RDW: 12.1 % (ref 11.7–15.4)
WBC: 7.8 10*3/uL (ref 3.4–10.8)

## 2023-07-15 LAB — HCV INTERPRETATION

## 2023-07-16 LAB — URINE CULTURE

## 2023-07-22 ENCOUNTER — Ambulatory Visit: Payer: Medicare Other | Attending: Family Medicine

## 2023-07-22 VITALS — Ht 64.0 in | Wt 156.0 lb

## 2023-07-22 DIAGNOSIS — Z0000 Encounter for general adult medical examination without abnormal findings: Secondary | ICD-10-CM | POA: Diagnosis not present

## 2023-07-22 DIAGNOSIS — Z780 Asymptomatic menopausal state: Secondary | ICD-10-CM

## 2023-07-22 DIAGNOSIS — Z1231 Encounter for screening mammogram for malignant neoplasm of breast: Secondary | ICD-10-CM

## 2023-07-22 NOTE — Patient Instructions (Addendum)
[name] , Thank you for taking time to come for your Medicare Wellness Visit. I appreciate your ongoing commitment to your health goals. Please review the following plan we discussed and let me know if I can assist you in the future.   These are the goals we discussed:  Goals      Remain active and independent        This is a list of the screening recommended for you and due dates:  Health Maintenance  Topic Date Due   Zoster (Shingles) Vaccine (1 of 2) Never done   COVID-19 Vaccine (5 - 2023-24 season) 08/29/2022   Flu Shot  07/30/2023   Medicare Annual Wellness Visit  07/21/2024   DTaP/Tdap/Td vaccine (3 - Td or Tdap) 02/01/2032   Pneumonia Vaccine  Completed   DEXA scan (bone density measurement)  Completed   Hepatitis C Screening  Completed   HPV Vaccine  Aged Out    Advanced directives: Information on Advanced Care Planning can be found at [facility] Advance Health Care Directives Advance Health Care Directives ([email or web address] Please bring a copy of your health care power of attorney and living will to the office to be added to your chart at your convenience.  Conditions/risks identified: Aim for 30 minutes of exercise or brisk walking, 6-8 glasses of water, and 5 servings of fruits and vegetables each day.  Next appointment: Follow up in one year for your annual wellness visit   You have an order for:  []   2D Mammogram  [x]   3D Mammogram  [x]   Bone Density     Please call for appointment:   [facility] [address] [place], [place] [address] [phone]  Make sure to wear two-piece clothing.  No lotions, powders, or deodorants the day of the appointment. Make sure to bring picture ID and insurance card.  Bring list of medications you are currently taking including any supplements.   Schedule your [place] screening mammogram through MyChart!   Log into your MyChart account.  Go to 'Visit' (or 'Appointments'  if on mobile App) --> Schedule an Appointment  Under 'Select a Reason for Visit' choose the Mammogram Screening option.  Complete the pre-visit questions and select the time and place that best fits your schedule.     Preventive Care 2 Years and Older, Female Preventive care refers to lifestyle choices and visits with your health care provider that can promote health and wellness. What does preventive care include? A yearly physical exam. This is also called an annual well check. Dental exams once or twice a year. Routine eye exams. Ask your health care provider how often you should have your eyes checked. Personal lifestyle choices, including: Daily care of your teeth and gums. Regular physical activity. Eating a healthy diet. Avoiding tobacco and drug use. Limiting alcohol use. Practicing safe sex. Taking low-dose aspirin every day. Taking vitamin and mineral supplements as recommended by your health care provider. What happens during an annual well check? The services and screenings done by your health care provider during your annual well check will depend on your age, overall health, lifestyle risk factors, and family history of disease. Counseling  Your health care provider may ask you questions about your: Alcohol use. Tobacco use. Drug use. Emotional well-being. Home and relationship well-being. Sexual activity. Eating habits. History of falls. Memory and ability to understand (cognition). Work and work Astronomer. Reproductive health. Screening  You may have the following  tests or measurements: Height, weight, and BMI. Blood pressure. Lipid and cholesterol levels. These may be checked every 5 years, or more frequently if you are over 87 years old. Skin check. Lung cancer screening. You may have this screening every year starting at age 22 if you have a 30-pack-year history of smoking and currently smoke or have quit within the past 15 years. Fecal occult blood  test (FOBT) of the stool. You may have this test every year starting at age 73. Flexible sigmoidoscopy or colonoscopy. You may have a sigmoidoscopy every 5 years or a colonoscopy every 10 years starting at age 65. Hepatitis C blood test. Hepatitis B blood test. Sexually transmitted disease (STD) testing. Diabetes screening. This is done by checking your blood sugar (glucose) after you have not eaten for a while (fasting). You may have this done every 1-3 years. Bone density scan. This is done to screen for osteoporosis. You may have this done starting at age 50. Mammogram. This may be done every 1-2 years. Talk to your health care provider about how often you should have regular mammograms. Talk with your health care provider about your test results, treatment options, and if necessary, the need for more tests. Vaccines  Your health care provider may recommend certain vaccines, such as: Influenza vaccine. This is recommended every year. Tetanus, diphtheria, and acellular pertussis (Tdap, Td) vaccine. You may need a Td booster every 10 years. Zoster vaccine. You may need this after age 16. Pneumococcal 13-valent conjugate (PCV13) vaccine. One dose is recommended after age 62. Pneumococcal polysaccharide (PPSV23) vaccine. One dose is recommended after age 32. Talk to your health care provider about which screenings and vaccines you need and how often you need them. This information is not intended to replace advice given to you by your health care provider. Make sure you discuss any questions you have with your health care provider. Document Released: 01/11/2016 Document Revised: 09/03/2016 Document Reviewed: 10/16/2015 Elsevier Interactive Patient Education  2017 [employer].  Fall Prevention in the Home Falls can cause injuries. They can happen to people of all ages. There are many things you can do to make your home safe and to help prevent falls. What can I do on the outside of my  home? Regularly fix the edges of walkways and driveways and fix any cracks. Remove anything that might make you trip as you walk through a door, such as a raised step or threshold. Trim any bushes or trees on the path to your home. Use bright outdoor lighting. Clear any walking paths of anything that might make someone trip, such as rocks or tools. Regularly check to see if handrails are loose or broken. Make sure that both sides of any steps have handrails. Any raised decks and porches should have guardrails on the edges. Have any leaves, snow, or ice cleared regularly. Use sand or salt on walking paths during winter. Clean up any spills in your garage right away. This includes oil or grease spills. What can I do in the bathroom? Use night lights. Install grab bars by the toilet and in the tub and shower. Do not use towel bars as grab bars. Use non-skid mats or decals in the tub or shower. If you need to sit down in the shower, use a plastic, non-slip stool. Keep the floor dry. Clean up any water that spills on the floor as soon as it happens. Remove soap buildup in the tub or shower regularly. Attach bath mats securely with  double-sided non-slip rug tape. Do not have throw rugs and other things on the floor that can make you trip. What can I do in the bedroom? Use night lights. Make sure that you have a light by your bed that is easy to reach. Do not use any sheets or blankets that are too big for your bed. They should not hang down onto the floor. Have a firm chair that has side arms. You can use this for support while you get dressed. Do not have throw rugs and other things on the floor that can make you trip. What can I do in the kitchen? Clean up any spills right away. Avoid walking on wet floors. Keep items that you use a lot in easy-to-reach places. If you need to reach something above you, use a strong step stool that has a grab bar. Keep electrical cords out of the way. Do  not use floor polish or wax that makes floors slippery. If you must use wax, use non-skid floor wax. Do not have throw rugs and other things on the floor that can make you trip. What can I do with my stairs? Do not leave any items on the stairs. Make sure that there are handrails on both sides of the stairs and use them. Fix handrails that are broken or loose. Make sure that handrails are as long as the stairways. Check any carpeting to make sure that it is firmly attached to the stairs. Fix any carpet that is loose or worn. Avoid having throw rugs at the top or bottom of the stairs. If you do have throw rugs, attach them to the floor with carpet tape. Make sure that you have a light switch at the top of the stairs and the bottom of the stairs. If you do not have them, ask someone to add them for you. What else can I do to help prevent falls? Wear shoes that: Do not have high heels. Have rubber bottoms. Are comfortable and fit you well. Are closed at the toe. Do not wear sandals. If you use a stepladder: Make sure that it is fully opened. Do not climb a closed stepladder. Make sure that both sides of the stepladder are locked into place. Ask someone to hold it for you, if possible. Clearly mark and make sure that you can see: Any grab bars or handrails. First and last steps. Where the edge of each step is. Use tools that help you move around (mobility aids) if they are needed. These include: Canes. Walkers. Scooters. Crutches. Turn on the lights when you go into a dark area. Replace any light bulbs as soon as they burn out. Set up your furniture so you have a clear path. Avoid moving your furniture around. If any of your floors are uneven, fix them. If there are any pets around you, be aware of where they are. Review your medicines with your doctor. Some medicines can make you feel dizzy. This can increase your chance of falling. Ask your doctor what other things that you can do to  help prevent falls. This information is not intended to replace advice given to you by your health care provider. Make sure you discuss any questions you have with your health care provider. Document Released: 10/11/2009 Document Revised: 05/22/2016 Document Reviewed: 01/19/2015 Elsevier Interactive Patient Education  2017 [employer].

## 2023-07-22 NOTE — Addendum Note (Signed)
Addended by: Anthoney Harada on: 07/22/2023 08:59 PM   Modules accepted: Orders

## 2023-07-22 NOTE — Progress Notes (Signed)
Subjective:   Nancy Vasquez is a 82 y.o. female who presents for an Initial Medicare Annual Wellness Visit.  Visit Complete: Virtual  I connected with  [name] on 07/22/23 by a audio enabled telemedicine application and verified that I am speaking with the correct person using two identifiers.  Patient Location: Home  Provider Location: Home Office  I discussed the limitations of evaluation and management by telemedicine. The patient expressed understanding and agreed to proceed.  Per patient no change in vitals since last visit; unable to obtain new vitals due to this being a telehealth visit. Patient was unable to self-report vital signs via telehealth due to a lack of equipment at home.  Review of Systems     Cardiac Risk Factors include: advanced age (>55men, >74 women);dyslipidemia     Objective:    Today's Vitals   07/22/23 1908  Weight: 156 lb (70.8 kg)  Height: 5\' 4"  (1.626 m)   Body mass index is 26.78 kg/m.     07/22/2023    7:15 PM 07/09/2023    8:56 PM 05/26/2023    3:58 PM 04/23/2023   10:49 AM 04/09/2023    1:00 PM 09/27/2022    9:26 PM 05/23/2020    3:44 PM  Advanced Directives  Does Patient Have a Medical Advance Directive? No No No No No No No  Would patient like information on creating a medical advance directive? Yes (MAU/Ambulatory/Procedural Areas - Information given) No - Patient declined No - Patient declined No - Patient declined Yes (Inpatient - patient requests chaplain consult to create a medical advance directive) No - Patient declined No - Patient declined    Current Medications (verified) Outpatient Encounter Medications as of 07/22/2023  Medication Sig   atorvastatin (LIPITOR) 40 MG tablet Take 1 tablet (40 mg total) by mouth daily.   clopidogrel (PLAVIX) 75 MG tablet Take 1 tablet (75 mg total) by mouth daily.   mirtazapine (REMERON) 7.5 MG tablet Take 1 tablet (7.5 mg total) by mouth at bedtime.   Vitamin D, Ergocalciferol,  (DRISDOL) 1.25 MG (50000 UNIT) CAPS capsule Take 50,000 Units by mouth once a week.   [DISCONTINUED] azithromycin (ZITHROMAX) 500 MG tablet Take 1 tablet (500 mg total) by mouth daily.   No facility-administered encounter medications on file as of 07/22/2023.    Allergies (verified) Ibuprofen and Oxycodone   History: 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   Folliculitis 04/10/2023   Past Surgical History:  Procedure Laterality Date   ABDOMINAL HYSTERECTOMY     BREAST EXCISIONAL BIOPSY Left    CORONARY PRESSURE/FFR STUDY N/A 03/17/2017   Procedure: Intravascular Pressure Wire/FFR Study;  Surgeon: Rinaldo Cloud, MD;  Location: [facility] INVASIVE CV LAB;  Service: Cardiovascular;  Laterality: N/A;  mid LAD   LEFT HEART CATH AND CORONARY ANGIOGRAPHY N/A 03/17/2017   Procedure: Left Heart Cath and Coronary Angiography;  Surgeon: Rinaldo Cloud, MD;  Location: [facility] INVASIVE CV LAB;  Service: Cardiovascular;  Laterality: N/A;   TONSILLECTOMY     Removed as a child   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   Procedure: LEFT TOTAL KNEE ARTHROPLASTY;  Surgeon: Cammy Copa, MD;  Location: [facility] OR;  Service: Orthopedics;  Laterality: Left;   Family History  Problem Relation Age of Onset   Breast cancer Sister 43       metastatic breast ca  Social History   Socioeconomic History   Marital status: Married    Spouse name: Not on file   Number of children: Not on file   Years of education: Not on file   Highest education level: Not on file  Occupational History   Not on file  Tobacco Use   Smoking status: Never   Smokeless tobacco: Never  Vaping Use   Vaping status: Never Used  Substance and Sexual Activity   Alcohol use: Never   Drug use: Never   Sexual activity: Yes  Other Topics Concern   Not on file  Social History Narrative   Not on file   Social Determinants of  Health   Financial Resource Strain: Low Risk  (07/22/2023)   Overall Financial Resource Strain (CARDIA)    Difficulty of Paying Living Expenses: Not hard at all  Food Insecurity: No Food Insecurity (07/22/2023)   Hunger Vital Sign    Worried About Running Out of Food in the Last Year: Never true    Ran Out of Food in the Last Year: Never true  Transportation Needs: No Transportation Needs (07/22/2023)   PRAPARE - Administrator, Civil Service (Medical): No    Lack of Transportation (Non-Medical): No  Physical Activity: Insufficiently Active (07/22/2023)   Exercise Vital Sign    Days of Exercise per Week: 3 days    Minutes of Exercise per Session: 30 min  Stress: No Stress Concern Present (07/22/2023)   [employer] of Occupational Health - Occupational Stress Questionnaire    Feeling of Stress : Not at all  Social Connections: Socially Integrated (07/22/2023)   Social Connection and Isolation Panel [NHANES]    Frequency of Communication with Friends and Family: More than three times a week    Frequency of Social Gatherings with Friends and Family: Three times a week    Attends Religious Services: More than 4 times per year    Active Member of Clubs or Organizations: Yes    Attends Banker Meetings: 1 to 4 times per year    Marital Status: Married    Tobacco Counseling Counseling given: Not Answered   Clinical Intake:  Pre-visit preparation completed: Yes  Pain : No/denies pain     Diabetes: No  How often do you need to have someone help you when you read instructions, pamphlets, or other written materials from your doctor or pharmacy?: 1 - Never  Interpreter Needed?: No  Information entered by :: Kandis Fantasia LPN   Activities of Daily Living    07/22/2023    7:14 PM 07/09/2023    8:58 PM  In your present state of health, do you have any difficulty performing the following activities:  Hearing? 0   Vision? 0   Difficulty concentrating  or making decisions? 0   Walking or climbing stairs? 1   Dressing or bathing? 0   Doing errands, shopping? 0 0  Preparing Food and eating ? N   Using the Toilet? N   In the past six months, have you accidently leaked urine? N   Do you have problems with loss of bowel control? N   Managing your Medications? N   Managing your Finances? N   Housekeeping or managing your Housekeeping? N     Patient Care Team: Hoy Register, MD as PCP - General (Family Medicine)  Indicate any recent Medical Services you may have received from other than [facility] providers in the past year (date may be approximate).  Assessment:   This is a routine wellness examination for Callia.  Hearing/Vision screen Hearing Screening - Comments:: Denies hearing difficulties   Vision Screening - Comments:: Wears rx glasses - up to date with routine eye exams with [facility]    Dietary issues and exercise activities discussed:     Goals Addressed             This Visit's Progress    Remain active and independent        Depression Screen    07/22/2023    7:16 PM 07/14/2023    3:12 PM 04/30/2023    2:32 PM 04/23/2023   10:56 AM  PHQ 2/9 Scores  PHQ - 2 Score 0 0 0 0  PHQ- 9 Score 2 2 0     Fall Risk    07/22/2023    7:13 PM 07/14/2023    1:57 PM 04/30/2023    2:32 PM 04/23/2023   10:57 AM  Fall Risk   Falls in the past year? 0 0 0 0  Number falls in past yr: 0 0 0 0  Injury with Fall? 0 0 0 0  Risk for fall due to : Impaired balance/gait;Impaired mobility No Fall Risks No Fall Risks Impaired balance/gait  Follow up Falls prevention discussed;Education provided;Falls evaluation completed   Falls evaluation completed;Falls prevention discussed    MEDICARE RISK AT HOME:  Medicare Risk at Home - 07/22/23 1913     Any stairs in or around the home? No    If so, are there any without handrails? No    Home free of loose throw rugs in walkways, pet beds, electrical cords, etc? Yes     Adequate lighting in your home to reduce risk of falls? Yes    Life alert? No    Use of a cane, walker or w/c? Yes    Grab bars in the bathroom? Yes    Shower chair or bench in shower? No    Elevated toilet seat or a handicapped toilet? Yes             TIMED UP AND GO:  Was the test performed? No    Cognitive Function:        07/22/2023    7:15 PM  6CIT Screen  What Year? 0 points  What month? 0 points  What time? 0 points  Count back from 20 0 points  Months in reverse 0 points  Repeat phrase 0 points  Total Score 0 points    Immunizations Immunization History  Administered Date(s) Administered   Fluad Quad(high Dose 65+) 11/11/2017, 09/30/2018, 09/10/2020, 10/30/2022   Influenza,inj,Quad PF,6+ Mos 10/07/2019   PFIZER(Purple Top)SARS-COV-2 Vaccination 02/27/2020, 03/28/2020, 10/13/2020   Pfizer Covid-19 Vaccine Bivalent Booster 45yrs & up 09/26/2021   Pneumococcal Conjugate-13 07/26/2019   Pneumococcal Polysaccharide-23 08/06/2020   Tdap 08/20/2015, 01/31/2022    TDAP status: Up to date  Pneumococcal vaccine status: Up to date  Covid-19 vaccine status: Information provided on how to obtain vaccines.   Qualifies for Shingles Vaccine? Yes   Zostavax completed No   Shingrix Completed?: No.    Education has been provided regarding the importance of this vaccine. Patient has been advised to call insurance company to determine out of pocket expense if they have not yet received this vaccine. Advised may also receive vaccine at local pharmacy or Health Dept. Verbalized acceptance and understanding.  Screening Tests Health Maintenance  Topic Date Due   Zoster Vaccines- Shingrix (1 of  2) Never done   COVID-19 Vaccine (5 - 2023-24 season) 08/29/2022   INFLUENZA VACCINE  07/30/2023   Medicare Annual Wellness (AWV)  07/21/2024   DTaP/Tdap/Td (3 - Td or Tdap) 02/01/2032   Pneumonia Vaccine 31+ Years old  Completed   DEXA SCAN  Completed   Hepatitis C Screening   Completed   HPV VACCINES  Aged Out    Health Maintenance  Health Maintenance Due  Topic Date Due   Zoster Vaccines- Shingrix (1 of 2) Never done   COVID-19 Vaccine (5 - 2023-24 season) 08/29/2022    Colorectal cancer screening: No longer required.   Mammogram status: Ordered today. Pt provided with contact info and advised to call to schedule appt.   Bone Density status: Ordered today. Pt provided with contact info and advised to call to schedule appt.  Lung Cancer Screening: (Low Dose CT Chest recommended if Age 7-80 years, 20 pack-year currently smoking OR have quit w/in 15years.) does not qualify.   Lung Cancer Screening Referral: n/a  Additional Screening:  Hepatitis C Screening: does qualify; Completed 07/15/23  Vision Screening: Recommended annual ophthalmology exams for early detection of glaucoma and other disorders of the eye. Is the patient up to date with their annual eye exam?  Yes  Who is the provider or what is the name of the office in which the patient attends annual eye exams? [facility] If pt is not established with a provider, would they like to be referred to a provider to establish care? No .   Dental Screening: Recommended annual dental exams for proper oral hygiene  Community Resource Referral / Chronic Care Management: CRR required this visit?  No   CCM required this visit?  No     Plan:     I have personally reviewed and noted the following in the patient's chart:   Medical and social history Use of alcohol, tobacco or illicit drugs  Current medications and supplements including opioid prescriptions. Patient is not currently taking opioid prescriptions. Functional ability and status Nutritional status Physical activity Advanced directives List of other physicians Hospitalizations, surgeries, and ER visits in previous 12 months Vitals Screenings to include cognitive, depression, and falls Referrals and  appointments  In addition, I have reviewed and discussed with patient certain preventive protocols, quality metrics, and best practice recommendations. A written personalized care plan for preventive services as well as general preventive health recommendations were provided to patient.     Kandis Fantasia [place], [place]   08/26/5620   After Visit Summary: (MyChart) Due to this being a telephonic visit, the after visit summary with patients personalized plan was offered to patient via MyChart   Nurse Notes: No concerns at this time

## 2023-07-23 ENCOUNTER — Ambulatory Visit: Payer: Medicare Other | Admitting: Family Medicine

## 2023-08-06 ENCOUNTER — Other Ambulatory Visit: Payer: Self-pay | Admitting: Family Medicine

## 2023-08-06 ENCOUNTER — Other Ambulatory Visit: Payer: Self-pay

## 2023-08-06 MED ORDER — VITAMIN D (ERGOCALCIFEROL) 1.25 MG (50000 UNIT) PO CAPS
50000.0000 [IU] | ORAL_CAPSULE | ORAL | 1 refills | Status: DC
  Filled 2023-08-06: qty 12, 84d supply, fill #0
  Filled 2023-08-26: qty 4, 28d supply, fill #0

## 2023-08-07 DIAGNOSIS — I2510 Atherosclerotic heart disease of native coronary artery without angina pectoris: Secondary | ICD-10-CM | POA: Diagnosis not present

## 2023-08-07 DIAGNOSIS — R0609 Other forms of dyspnea: Secondary | ICD-10-CM | POA: Diagnosis not present

## 2023-08-07 DIAGNOSIS — E782 Mixed hyperlipidemia: Secondary | ICD-10-CM | POA: Diagnosis not present

## 2023-08-07 DIAGNOSIS — I10 Essential (primary) hypertension: Secondary | ICD-10-CM | POA: Diagnosis not present

## 2023-08-14 ENCOUNTER — Other Ambulatory Visit: Payer: Self-pay

## 2023-08-19 ENCOUNTER — Ambulatory Visit (HOSPITAL_BASED_OUTPATIENT_CLINIC_OR_DEPARTMENT_OTHER): Payer: Medicare Other | Attending: Family Medicine | Admitting: Physical Therapy

## 2023-08-19 ENCOUNTER — Other Ambulatory Visit: Payer: Self-pay

## 2023-08-19 ENCOUNTER — Encounter (HOSPITAL_BASED_OUTPATIENT_CLINIC_OR_DEPARTMENT_OTHER): Payer: Self-pay | Admitting: Physical Therapy

## 2023-08-19 DIAGNOSIS — G8929 Other chronic pain: Secondary | ICD-10-CM | POA: Insufficient documentation

## 2023-08-19 DIAGNOSIS — M25561 Pain in right knee: Secondary | ICD-10-CM | POA: Insufficient documentation

## 2023-08-19 DIAGNOSIS — M1712 Unilateral primary osteoarthritis, left knee: Secondary | ICD-10-CM | POA: Diagnosis not present

## 2023-08-19 DIAGNOSIS — R2689 Other abnormalities of gait and mobility: Secondary | ICD-10-CM | POA: Diagnosis not present

## 2023-08-19 DIAGNOSIS — M25562 Pain in left knee: Secondary | ICD-10-CM | POA: Insufficient documentation

## 2023-08-19 NOTE — Therapy (Addendum)
OUTPATIENT PHYSICAL THERAPY LOWER EXTREMITY EVALUATION   Patient Name: Nancy Vasquez MRN: [id number] DOB:Feb 11, 1941, 82 y.o., female Today's Date: 08/19/2023  END OF SESSION:  PT End of Session - 08/19/23 1307     Visit Number 1    Number of Visits 16    Date for PT Re-Evaluation 11/25/23   Patient can not start until October   Authorization Type [employer]    PT Start Time 1100    PT Stop Time 1144    PT Time Calculation (min) 44 min    Activity Tolerance Patient tolerated treatment well    Behavior During Therapy [facility] for tasks assessed/performed             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   Folliculitis 04/10/2023   Past Surgical History:  Procedure Laterality Date   ABDOMINAL HYSTERECTOMY     BREAST EXCISIONAL BIOPSY Left    CORONARY PRESSURE/FFR STUDY N/A 03/17/2017   Procedure: Intravascular Pressure Wire/FFR Study;  Surgeon: Rinaldo Cloud, MD;  Location: [facility] INVASIVE CV LAB;  Service: Cardiovascular;  Laterality: N/A;  mid LAD   LEFT HEART CATH AND CORONARY ANGIOGRAPHY N/A 03/17/2017   Procedure: Left Heart Cath and Coronary Angiography;  Surgeon: Rinaldo Cloud, MD;  Location: [facility] INVASIVE CV LAB;  Service: Cardiovascular;  Laterality: N/A;   TONSILLECTOMY     Removed as a child   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   Procedure: LEFT TOTAL KNEE ARTHROPLASTY;  Surgeon: Cammy Copa, MD;  Location: [facility] OR;  Service: Orthopedics;  Laterality: Left;   Patient Active Problem List   Diagnosis Date Noted   Sepsis (HCC) 07/09/2023   Acute encephalopathy 07/09/2023   Candida vaginitis 04/23/2023   Cellulitis 04/09/2023   TIA (transient ischemic attack) 03/17/2023   HLD (hyperlipidemia) 11/06/2022   Coronary artery disease 09/27/2022   Osteoarthritis of left knee 04/19/2020    PCP: Dr Hoy Register   REFERRING PROVIDER:  Dr Hoy Register  REFERRING DIAG:  Diagnosis  M17.12 (ICD-10-CM) - Primary osteoarthritis of left knee    THERAPY DIAG:  Chronic pain of left knee  Other abnormalities of gait and mobility  Chronic pain of right knee  Rationale for Evaluation and Treatment: Rehabilitation  ONSET DATE:    SUBJECTIVE:   SUBJECTIVE STATEMENT: Patient had a knee replacement 3 years prior on her left knee.  The knee was never completely pain-free.  She has had continuous pain since that point with activity and when she sits too long.  She had a fall at some point on the knee to which increase the pain.  She was in the hospital for sepsis on 07/09/2023.  Since that point she felt like she has decreased endurance with activity.  She has difficulty transferring from sit to stand.  She has also had a another increase in knee pain.  PERTINENT HISTORY: Right hip replacement, TIA patient reports she has had no residual side effects, left total knee replacement PAIN:  Are you having pain? Yes: NPRS scale: 4/10 Pain location: Left knee Pain description: Aching Aggravating factors: Standing, walking, sitting Relieving factors: Rest  PRECAUTIONS: None  RED FLAGS: None   WEIGHT BEARING RESTRICTIONS: No  FALLS:  Has patient fallen in last 6 months? No  LIVING ENVIRONMENT: 2 sets of stairs inside the house.  OCCUPATION:  Retired   Presenter, broadcasting:  Building control surveyor  PLOF:   Independent with a cane since afall > 6 months ago   PATIENT GOALS:  Improve mobility/ decrease pain in both knees  NEXT MD VISIT:    OBJECTIVE:   DIAGNOSTIC FINDINGS:   PATIENT SURVEYS:  FOTO    COGNITION: Overall cognitive status: Within functional limits for tasks assessed     SENSATION: Burning down into the shin; Since her bout with sepsis her hands are numb   EDEMA:  Pocket of swelling in the inferior lateral knee    MUSCLE LENGTH:   POSTURE: No Significant postural limitations  PALPATION: Tenderness  to palpation   LOWER EXTREMITY ROM:  Passive ROM Right eval Left eval  Hip flexion    Hip extension    Hip abduction    Hip adduction    Hip internal rotation    Hip external rotation    Knee flexion  125  Knee extension -4 -10  Ankle dorsiflexion    Ankle plantarflexion    Ankle inversion    Ankle eversion     (Blank rows = not tested)  LOWER EXTREMITY MMT:  MMT Right eval Left eval  Hip flexion 13.6 17.0  Hip extension    Hip abduction 14.3 15.2  Hip adduction    Hip internal rotation    Hip external rotation    Knee flexion    Knee extension 22.0 17.0  Ankle dorsiflexion    Ankle plantarflexion    Ankle inversion    Ankle eversion     (Blank rows = not tested)   5x sit to stand test 29 seconds   GAIT: Uses a cane in the right hand; decreased hip flexion; bilateral lateral weight shifting.  TODAY'S TREATMENT:                                                                                                                              DATE:     PATIENT EDUCATION:  Education details: HEP, symptom management, POC going forward Person educated: Patient Education method: Explanation, Demonstration, Tactile cues, Verbal cues, and Handouts Education comprehension: verbalized understanding, returned demonstration, verbal cues required, tactile cues required, and needs further education  HOME EXERCISE PROGRAM: Access Code: [email or web address] URL: [email or web address] Date: 08/20/2023 Prepared by: Lorayne Bender  Exercises - Long Sitting 4 Way Patellar Glide  - 1 x daily - 7 x weekly - 3 sets - 10 reps - Supine Quad Set  - 1 x daily - 7 x weekly - 3 sets - 10 reps - Seated March  - 1 x daily - 7 x weekly - 3 sets - 10 reps - Seated Hip Abduction with Resistance  - 1 x daily - 7 x weekly - 3 sets - 10 reps  ASSESSMENT:  CLINICAL IMPRESSION: Patient is a 82 year old female who presents with left knee pain and decreased endurance following a  hospitalization.  She has pain with terminal knee extension.  She also has limited mobility of her  patella.  She has increased pain when she is standing and walking.  She also has increased pain when she is sits for too long.  She has increased difficulty standing from a sitting position.  She has a decrease 5 times sit to stand time.  She would benefit from skilled therapy to decrease left knee pain, increase endurance, and increase general functional mobility. OBJECTIVE IMPAIRMENTS: decreased activity tolerance, decreased ROM, and pain.    ACTIVITY LIMITATIONS: locomotion level   PARTICIPATION LIMITATIONS: driving and recreational activities, normal physical activites   PERSONAL FACTORS: Age, Time since onset of injury/illness/exacerbation, and 3+ comorbidities: HLD, TIA, OA, CAD  are also affecting patient's functional outcome.    REHAB POTENTIAL: Good  CLINICAL DECISION MAKING: Evolving/moderate complexity recent hospitalization has caused a decline in general mobility   EVALUATION COMPLEXITY: Moderate   GOALS: Goals reviewed with patient? Yes  SHORT TERM GOALS: Target date: 09/16/2023   Patient will decrease TUG time by 10 sec  Baseline: Goal status: INITIAL  2.  Patient will increase bilateral LE strength by 5 lbs  Baseline:  Goal status: INITIAL  3.  Patient will demonstrate full PROM into extension with the left knee without pain   Baseline:  Goal status: INITIAL   LONG TERM GOALS: Target date:  10/14/2023    Patient will transfer sit to stand without difficulty patient will ambulate community distances without difficulty goal status: INITIAL  2.  patient will ambulate community distances without difficulty  Baseline:  Goal status: INITIAL  3.  Patient will stand for greater than 20 minutes without increased left knee pain in order to perform ADLs Baseline:  Goal status: INITIAL  4.  Patient will go up and down stairs without difficulty Baseline:  Goal  status: INITIAL     PLAN:  PT FREQUENCY: 1-2x/week  PT DURATION: 8 weeks can not start until October 2nd to   PLANNED INTERVENTIONS:  Therapeutic exercises, Therapeutic activity, Neuromuscular re-education, Balance training, Gait training, Patient/Family education, Self Care, Joint mobilization, Stair training, DME instructions, Aquatic Therapy, Dry Needling, Electrical stimulation, Cryotherapy, Moist heat, Taping, Manual therapy, and Re-evaluation.   PLAN FOR NEXT SESSION:  Consider joint mobilization increase terminal knee extension, work on patella mobility.  Consider straight leg raise, sit to stand transfers; consider nu-step and balance exercises; progress hip and knee exercises as tolerated.   Dessie Coma, PT 08/19/2023, 1:09 PM

## 2023-08-20 ENCOUNTER — Encounter (HOSPITAL_BASED_OUTPATIENT_CLINIC_OR_DEPARTMENT_OTHER): Payer: Self-pay | Admitting: Physical Therapy

## 2023-08-24 ENCOUNTER — Ambulatory Visit: Payer: Self-pay

## 2023-08-24 NOTE — Telephone Encounter (Signed)
  Chief Complaint: Knee pain, hard stools Symptoms: above Frequency: Knee pain is years, hard stools is recent  Pertinent Negatives: Patient denies redness, fever Disposition: [] ED /[] Urgent Care (no appt availability in office) / [x] Appointment(In office/virtual)/ []  [place] [facility]/ [] Home Care/ [] Refused Recommended Disposition /[] [place] [facility]/ []  Follow-up with PCP Additional Notes: Pt states that both knees are painful. Burning sensation going down left leg has resolved. Pt continues to use Voltaren gel with some relief. Pt went to PT and had a session. Pt is limited in when she can go to PT d/t her husband's appointments. Pt was given exercises that she can do at home.  Pt also mentioned hard ball stools. Pt states she is drinking enough water. Pt will try an OTC stool softener. Pt has OV on 08/26/2023    Summary: knee pain.   Pt stated 3-4 years ago had a total left knee replacement. Stated has been experiencing pain, burning, 5-10. Stated was recently in ED due to sepsis and since both knees have been painful. Scheduled pt for first availble with PCP for 08/28 at 3:10.Pt was explained that if the nurse finds the appointment not appropriate, she will cancel the appointment.   Pt seeking clinical advice.     Reason for Disposition  [1] MODERATE pain (e.g., interferes with normal activities, limping) AND [2] present > 3 days  Answer Assessment - Initial Assessment Questions 1. ONSET: "When did the pain start?"      3-4 years ago 2. LOCATION: "Where is the pain located?"      Knee - both knees 3. PAIN: "How bad is the pain?"    (Scale 1-10; or mild, moderate, severe)   -  MILD (1-3): doesn't interfere with normal activities    -  MODERATE (4-7): interferes with normal activities (e.g., work or school) or awakens from sleep, limping    -  SEVERE (8-10): excruciating pain, unable to do any normal activities, unable to walk     Moderate 4. WORK OR EXERCISE: "Has there  been any recent work or exercise that involved this part of the body?"      no 5. CAUSE: "What do you think is causing the leg pain?"     Knee sx 6. OTHER SYMPTOMS: "Do you have any other symptoms?" (e.g., chest pain, back pain, breathing difficulty, swelling, rash, fever, numbness, weakness)     Small hard BMs  Protocols used: Leg Pain-A-AH

## 2023-08-26 ENCOUNTER — Other Ambulatory Visit: Payer: Self-pay

## 2023-08-26 ENCOUNTER — Ambulatory Visit: Payer: Medicare Other | Admitting: Family Medicine

## 2023-08-27 ENCOUNTER — Other Ambulatory Visit: Payer: Self-pay

## 2023-08-27 MED ORDER — ERGOCALCIFEROL 1.25 MG (50000 UT) PO CAPS
50000.0000 [IU] | ORAL_CAPSULE | ORAL | 3 refills | Status: DC
  Filled 2024-07-05: qty 12, 84d supply, fill #0

## 2023-08-27 MED ORDER — NITROGLYCERIN 0.4 MG SL SUBL
SUBLINGUAL_TABLET | SUBLINGUAL | 3 refills | Status: AC
  Filled 2023-08-28: qty 25, 9d supply, fill #0
  Filled 2024-02-26 – 2024-02-29 (×2): qty 25, 9d supply, fill #1
  Filled 2024-07-05: qty 25, 9d supply, fill #2
  Filled 2024-08-02 (×2): qty 25, 9d supply, fill #3

## 2023-08-27 MED ORDER — ATORVASTATIN CALCIUM 20 MG PO TABS
20.0000 mg | ORAL_TABLET | Freq: Every day | ORAL | 3 refills | Status: DC
  Filled 2023-08-28: qty 90, 90d supply, fill #0
  Filled 2023-11-23: qty 90, 90d supply, fill #1
  Filled 2024-02-26 – 2024-02-29 (×2): qty 90, 90d supply, fill #2

## 2023-08-28 ENCOUNTER — Other Ambulatory Visit (HOSPITAL_COMMUNITY): Payer: Self-pay

## 2023-08-28 ENCOUNTER — Other Ambulatory Visit: Payer: Self-pay

## 2023-08-28 ENCOUNTER — Encounter: Payer: Self-pay | Admitting: Nurse Practitioner

## 2023-08-28 ENCOUNTER — Ambulatory Visit: Payer: Medicare Other | Attending: Family Medicine | Admitting: Nurse Practitioner

## 2023-08-28 VITALS — BP 115/67 | HR 60 | Ht 64.0 in | Wt 157.8 lb

## 2023-08-28 DIAGNOSIS — E559 Vitamin D deficiency, unspecified: Secondary | ICD-10-CM | POA: Diagnosis not present

## 2023-08-28 DIAGNOSIS — M1712 Unilateral primary osteoarthritis, left knee: Secondary | ICD-10-CM | POA: Diagnosis not present

## 2023-08-28 MED ORDER — VITAMIN D (ERGOCALCIFEROL) 1.25 MG (50000 UNIT) PO CAPS
50000.0000 [IU] | ORAL_CAPSULE | ORAL | 1 refills | Status: DC
Start: 2023-08-28 — End: 2024-07-29
  Filled 2023-08-28 – 2023-11-27 (×3): qty 12, 84d supply, fill #0
  Filled 2024-02-26 – 2024-02-29 (×2): qty 12, 84d supply, fill #1

## 2023-08-28 MED ORDER — DICLOFENAC SODIUM 1 % EX GEL
4.0000 g | Freq: Four times a day (QID) | CUTANEOUS | 1 refills | Status: DC
Start: 2023-08-28 — End: 2024-07-29
  Filled 2023-08-28: qty 200, 25d supply, fill #0
  Filled 2023-08-28: qty 200, 12d supply, fill #0
  Filled 2024-02-26 – 2024-07-05 (×2): qty 200, 25d supply, fill #1

## 2023-08-28 NOTE — Progress Notes (Signed)
Assessment & Plan:  [name] was seen today for knee pain.  Diagnoses and all orders for this visit:  Primary osteoarthritis of left knee -     diclofenac Sodium (VOLTAREN ARTHRITIS PAIN) 1 % GEL; Apply 4 g topically 4 (four) times daily. May alternate with heat and ice application for pain relief. May also alternate with acetaminophen as prescribed pain relief. Other alternatives include massage, acupuncture and water aerobics.  Try to stay active and avoid a sedentary lifestyle.   Vitamin D deficiency disease -     Vitamin D, Ergocalciferol, (DRISDOL) 1.25 MG (50000 UNIT) CAPS capsule; Take 1 capsule (50,000 Units total) by mouth once a week.    Patient has been counseled on age-appropriate routine health concerns for screening and prevention. These are reviewed and up-to-date. Referrals have been placed accordingly. Immunizations are up-to-date or declined.    Subjective:   Chief Complaint  Patient presents with   Knee Pain    Nancy Vasquez 82 y.o. female presents to office today for follow up show chronic left knee pain.  Nancy Vasquez is a 82 y.o. year old female with a history of CAD (followed by Dr Sharyn Lull), TIA , left knee osteoarthritis (s/p left TKR),  right THR, hyperlipidemia, hypertension .  Nancy Vasquez is currently receiving physical therapy for her primary osteoarthritis of the left knee.  She does not feel physical therapy is very helpful.  States she continues with chronic left knee pain since her total knee replacement 3 years ago.  She also has radiating pain down the left shin.  She has received steroid injections in the past which were also ineffective.  She has noticed however lately using diclofenac gel seems to provide moderate relief of her pain and she would like to have this refilled today.  States she just wanted to come in and make sure she does not have a blood clot in her leg.  Left leg is normal on exam today aside from minimal swelling of the  left knee.  A surgical scar is present as well.  She does use a cane to assist with mobility.     Review of Systems  Constitutional:  Negative for fever, malaise/fatigue and weight loss.  HENT: Negative.  Negative for nosebleeds.   Eyes: Negative.  Negative for blurred vision, double vision and photophobia.  Respiratory: Negative.  Negative for cough and shortness of breath.   Cardiovascular: Negative.  Negative for chest pain, palpitations and leg swelling.  Gastrointestinal: Negative.  Negative for heartburn, nausea and vomiting.  Musculoskeletal:  Positive for joint pain. Negative for myalgias.  Neurological: Negative.  Negative for dizziness, focal weakness, seizures and headaches.  Psychiatric/Behavioral: Negative.  Negative for suicidal ideas.     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   Folliculitis 04/10/2023    Past Surgical History:  Procedure Laterality Date   ABDOMINAL HYSTERECTOMY     BREAST EXCISIONAL BIOPSY Left    CORONARY PRESSURE/FFR STUDY N/A 03/17/2017   Procedure: Intravascular Pressure Wire/FFR Study;  Surgeon: Rinaldo Cloud, MD;  Location: [facility] INVASIVE CV LAB;  Service: Cardiovascular;  Laterality: N/A;  mid LAD   LEFT HEART CATH AND CORONARY ANGIOGRAPHY N/A 03/17/2017   Procedure: Left Heart Cath and Coronary Angiography;  Surgeon: Rinaldo Cloud, MD;  Location: [facility] INVASIVE CV LAB;  Service: Cardiovascular;  Laterality: N/A;   TONSILLECTOMY  Removed as a child   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   Procedure: LEFT TOTAL KNEE ARTHROPLASTY;  Surgeon: Cammy Copa, MD;  Location: [facility] OR;  Service: Orthopedics;  Laterality: Left;    Family History  Problem Relation Age of Onset   Breast cancer Sister 84       metastatic breast ca    Social History Reviewed with no changes to be made today.   Outpatient Medications Prior to Visit   Medication Sig Dispense Refill   atorvastatin (LIPITOR) 20 MG tablet Take 1 tablet (20 mg total) by mouth daily. 90 tablet 3   clopidogrel (PLAVIX) 75 MG tablet Take 1 tablet (75 mg total) by mouth daily. 90 tablet 1   nitroGLYCERIN (NITROSTAT) 0.4 MG SL tablet Place under the tongue at the first sign of attack. Repeat every 5 minutes up to 3 tabs if no relief see medical help. 25 tablet 3   ergocalciferol (VITAMIN D2) 1.25 MG (50000 UT) capsule Take 1 capsule (50,000 Units total) by mouth once a week. (Patient not taking: Reported on 08/28/2023) 12 capsule 3   mirtazapine (REMERON) 7.5 MG tablet Take 1 tablet (7.5 mg total) by mouth at bedtime. (Patient not taking: Reported on 08/19/2023) 30 tablet 0   Vitamin D, Ergocalciferol, (DRISDOL) 1.25 MG (50000 UNIT) CAPS capsule Take 1 capsule (50,000 Units total) by mouth once a week. (Patient not taking: Reported on 08/28/2023) 12 capsule 1   No facility-administered medications prior to visit.    Allergies  Allergen Reactions   Ibuprofen Nausea And Vomiting and Other (See Comments)    "burns stomach"   Oxycodone Nausea Only    "Burns stomach"       Objective:    BP 115/67 (BP Location: Left Arm, Patient Position: Sitting, Cuff Size: Normal)   Pulse 60   Ht 5\' 4"  (1.626 m)   Wt 157 lb 12.8 oz (71.6 kg)   SpO2 100%   BMI 27.09 kg/m  Wt Readings from Last 3 Encounters:  08/28/23 157 lb 12.8 oz (71.6 kg)  07/22/23 156 lb (70.8 kg)  07/14/23 156 lb 6.4 oz (70.9 kg)    Physical Exam Vitals and nursing note reviewed.  Constitutional:      Appearance: She is well-developed.  HENT:     Head: Normocephalic and atraumatic.  Cardiovascular:     Rate and Rhythm: Normal rate and regular rhythm.     Heart sounds: Normal heart sounds. No murmur heard.    No friction rub. No gallop.  Pulmonary:     Effort: Pulmonary effort is normal. No tachypnea or respiratory distress.     Breath sounds: Normal breath sounds. No decreased breath sounds,  wheezing, rhonchi or rales.  Chest:     Chest wall: No tenderness.  Abdominal:     General: Bowel sounds are normal.     Palpations: Abdomen is soft.  Musculoskeletal:        General: Normal range of motion.     Cervical back: Normal range of motion.     Left knee: Swelling (mild) present.     Right lower leg: Normal. No swelling. No edema.     Left lower leg: Normal. No swelling. No edema.  Skin:    General: Skin is warm and dry.  Neurological:     Mental Status: She is alert and oriented to person, place, and time.     Coordination: Coordination normal.  Psychiatric:        Behavior: Behavior normal.  Behavior is cooperative.        Thought Content: Thought content normal.        Judgment: Judgment normal.          Patient has been counseled extensively about nutrition and exercise as well as the importance of adherence with medications and regular follow-up. The patient was given clear instructions to go to ER or return to medical center if symptoms don't improve, worsen or new problems develop. The patient verbalized understanding.   Follow-up: Return if symptoms worsen or fail to improve.   Claiborne Rigg, FNP-BC [facility] and [facility] [place], [place] [phone]   08/28/2023, 2:51 PM

## 2023-09-07 ENCOUNTER — Ambulatory Visit: Payer: Self-pay

## 2023-09-07 NOTE — Telephone Encounter (Signed)
     Chief Complaint: Pain both knees. "I think I needs X-rays or something." No availability with PCP. Asking to be worked in. Symptoms: Pain Frequency: Since August Pertinent Negatives: Patient denies  Disposition: [] ED /[] Urgent Care (no appt availability in office) / [] Appointment(In office/virtual)/ []  [place] [facility]/ [] Home Care/ [] Refused Recommended Disposition /[] [place] [facility]/ [x]  Follow-up with PCP Additional Notes: Office currently closed for lunch. Please advise pt.  Reason for Disposition  [1] Very swollen joint AND [2] no fever  Answer Assessment - Initial Assessment Questions 1. LOCATION and RADIATION: "Where is the pain located?"      Both 2. QUALITY: "What does the pain feel like?"  (e.g., sharp, dull, aching, burning)     Burning 3. SEVERITY: "How bad is the pain?" "What does it keep you from doing?"   (Scale 1-10; or mild, moderate, severe)   -  MILD (1-3): doesn't interfere with normal activities    -  MODERATE (4-7): interferes with normal activities (e.g., work or school) or awakens from sleep, limping    -  SEVERE (8-10): excruciating pain, unable to do any normal activities, unable to walk     8 4. ONSET: "When did the pain start?" "Does it come and go, or is it there all the time?"     August 5. RECURRENT: "Have you had this pain before?" If Yes, ask: "When, and what happened then?"     Yes 6. SETTING: "Has there been any recent work, exercise or other activity that involved that part of the body?"      No 7. AGGRAVATING FACTORS: "What makes the knee pain worse?" (e.g., walking, climbing stairs, running)     Steps 8. ASSOCIATED SYMPTOMS: "Is there any swelling or redness of the knee?"     No 9. OTHER SYMPTOMS: "Do you have any other symptoms?" (e.g., chest pain, difficulty breathing, fever, calf pain)     No 10. PREGNANCY: "Is there any chance you are pregnant?" "When was your last menstrual period?"       No  Protocols used: Knee  Pain-A-AH

## 2023-09-08 NOTE — Telephone Encounter (Signed)
Calling patient back regarding knee pain and XR request. Left message on voicemail to return call.  Goes straight to voicemail (2 times).  Has she used her medication for arthritis pain?  Has she had any recent falls?  Taken other OTC medications?  Had an apt on 08/28/2023.  She also is in PT. Has had a TKR 3 years ago.

## 2023-09-11 DIAGNOSIS — M25561 Pain in right knee: Secondary | ICD-10-CM | POA: Diagnosis not present

## 2023-09-11 DIAGNOSIS — M1711 Unilateral primary osteoarthritis, right knee: Secondary | ICD-10-CM | POA: Diagnosis not present

## 2023-09-11 DIAGNOSIS — M25562 Pain in left knee: Secondary | ICD-10-CM | POA: Diagnosis not present

## 2023-09-24 ENCOUNTER — Ambulatory Visit: Payer: Medicare Other | Admitting: Family Medicine

## 2023-09-30 ENCOUNTER — Other Ambulatory Visit: Payer: Self-pay

## 2023-10-06 ENCOUNTER — Ambulatory Visit (HOSPITAL_BASED_OUTPATIENT_CLINIC_OR_DEPARTMENT_OTHER): Payer: Medicare Other | Attending: Family Medicine

## 2023-10-06 ENCOUNTER — Encounter (HOSPITAL_BASED_OUTPATIENT_CLINIC_OR_DEPARTMENT_OTHER): Payer: Self-pay

## 2023-10-06 DIAGNOSIS — M25561 Pain in right knee: Secondary | ICD-10-CM | POA: Diagnosis not present

## 2023-10-06 DIAGNOSIS — R2689 Other abnormalities of gait and mobility: Secondary | ICD-10-CM | POA: Insufficient documentation

## 2023-10-06 DIAGNOSIS — M25562 Pain in left knee: Secondary | ICD-10-CM | POA: Insufficient documentation

## 2023-10-06 DIAGNOSIS — G8929 Other chronic pain: Secondary | ICD-10-CM | POA: Diagnosis not present

## 2023-10-06 NOTE — Therapy (Signed)
OUTPATIENT PHYSICAL THERAPY LOWER EXTREMITY TREATMENT   Patient Name: Nancy Vasquez MRN: [id number] DOB:11/10/1941, 82 y.o., female Today's Date: 10/06/2023  END OF SESSION:  PT End of Session - 10/06/23 1228     Visit Number 2    Number of Visits 16    Date for PT Re-Evaluation 11/25/23    Authorization Type BCBS    PT Start Time 1158    PT Stop Time 1230    PT Time Calculation (min) 32 min    Activity Tolerance Patient tolerated treatment well              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   Folliculitis 04/10/2023   Past Surgical History:  Procedure Laterality Date   ABDOMINAL HYSTERECTOMY     BREAST EXCISIONAL BIOPSY Left    CORONARY PRESSURE/FFR STUDY N/A 03/17/2017   Procedure: Intravascular Pressure Wire/FFR Study;  Surgeon: Rinaldo Cloud, MD;  Location: [facility] INVASIVE CV LAB;  Service: Cardiovascular;  Laterality: N/A;  mid LAD   LEFT HEART CATH AND CORONARY ANGIOGRAPHY N/A 03/17/2017   Procedure: Left Heart Cath and Coronary Angiography;  Surgeon: Rinaldo Cloud, MD;  Location: [facility] INVASIVE CV LAB;  Service: Cardiovascular;  Laterality: N/A;   TONSILLECTOMY     Removed as a child   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   Procedure: LEFT TOTAL KNEE ARTHROPLASTY;  Surgeon: Cammy Copa, MD;  Location: [facility] OR;  Service: Orthopedics;  Laterality: Left;   Patient Active Problem List   Diagnosis Date Noted   Sepsis (HCC) 07/09/2023   Acute encephalopathy 07/09/2023   Candida vaginitis 04/23/2023   Cellulitis 04/09/2023   TIA (transient ischemic attack) 03/17/2023   HLD (hyperlipidemia) 11/06/2022   Coronary artery disease 09/27/2022   Osteoarthritis of left knee 04/19/2020    PCP: Dr Hoy Register   REFERRING PROVIDER:  Dr Hoy Register  REFERRING DIAG:  Diagnosis  M17.12 (ICD-10-CM) - Primary osteoarthritis of left knee     THERAPY DIAG:  Chronic pain of left knee  Chronic pain of right knee  Other abnormalities of gait and mobility  Rationale for Evaluation and Treatment: Rehabilitation  ONSET DATE:    SUBJECTIVE:   SUBJECTIVE STATEMENT:  Pt reports burning pain from knee down in L LE since surgery 3 yrs ago. R LE has recently started burning as well. Reports 10/10 pain level at entry. Ambulates with SPC.  EVAL: Patient had a knee replacement 3 years prior on her left knee.  The knee was never completely pain-free.  She has had continuous pain since that point with activity and when she sits too long.  She had a fall at some point on the knee to which increase the pain.  She was in the hospital for sepsis on 07/09/2023.  Since that point she felt like she has decreased endurance with activity.  She has difficulty transferring from sit to stand.  She has also had a another increase in knee pain.  PERTINENT HISTORY: Right hip replacement, TIA patient reports she has had no residual side effects, left total knee replacement PAIN:  Are you having pain? Yes: NPRS scale: 10/10 Pain location: Left knee Pain description: Aching Aggravating factors: Standing, walking, sitting Relieving factors: Rest  PRECAUTIONS: None  RED FLAGS: None   WEIGHT BEARING RESTRICTIONS: No  FALLS:  Has patient fallen in last 6 months? No  LIVING  ENVIRONMENT: 2 sets of stairs inside the house.  OCCUPATION:  Retired   Presenter, broadcasting:  Building control surveyor      PLOF:   Independent with a cane since afall > 6 months ago   PATIENT GOALS:  Improve mobility/ decrease pain in both knees  NEXT MD VISIT:    OBJECTIVE:   DIAGNOSTIC FINDINGS:   PATIENT SURVEYS:  FOTO    COGNITION: Overall cognitive status: Within functional limits for tasks assessed     SENSATION: Burning down into the shin; Since her bout with sepsis her hands are numb   EDEMA:  Pocket of swelling in the inferior lateral knee     MUSCLE LENGTH:   POSTURE: No Significant postural limitations  PALPATION: Tenderness to palpation   LOWER EXTREMITY ROM:  Active ROM Right 10/8 Left 10/8  Hip flexion    Hip extension    Hip abduction    Hip adduction    Hip internal rotation    Hip external rotation    Knee flexion 98 107  Knee extension -1 0  Ankle dorsiflexion    Ankle plantarflexion    Ankle inversion    Ankle eversion     (Blank rows = not tested)  LOWER EXTREMITY MMT:  MMT Right eval Left eval  Hip flexion 13.6 17.0  Hip extension    Hip abduction 14.3 15.2  Hip adduction    Hip internal rotation    Hip external rotation    Knee flexion    Knee extension 22.0 17.0  Ankle dorsiflexion    Ankle plantarflexion    Ankle inversion    Ankle eversion     (Blank rows = not tested)   5x sit to stand test 29 seconds   GAIT: Uses a cane in the right hand; decreased hip flexion; bilateral lateral weight shifting.  TODAY'S TREATMENT:                                                                                                                              DATE:    10/8: Tib/fem mobilizations posterior glide  Patellar mobilizations PROM SLR x10 ea LAQ 5" x15ea Seated HSC YTB x10ea Seated marching 2x10   PATIENT EDUCATION:  Education details: HEP, symptom management, POC going forward Person educated: Patient Education method: Explanation, Demonstration, Tactile cues, Verbal cues, and Handouts Education comprehension: verbalized understanding, returned demonstration, verbal cues required, tactile cues required, and needs further education  HOME EXERCISE PROGRAM: Access Code: [email or web address] URL: [email or web address] Date: 08/20/2023 Prepared by: Lorayne Bender  Exercises - Long Sitting 4 Way Patellar Glide  - 1 x daily - 7 x weekly - 3 sets - 10 reps - Supine Quad Set  - 1 x daily - 7 x weekly - 3 sets - 10 reps - Seated March  - 1 x daily - 7 x weekly - 3 sets - 10  reps - Seated Hip Abduction with Resistance  - 1 x daily -  7 x weekly - 3 sets - 10 reps  ASSESSMENT:  CLINICAL IMPRESSION: Pt demonstrates overall good active knee extension bilaterally, with very mild R knee deficit. Good left patella mobility noted. Right patella does have some crepitus with passive mobilizations as well as pain. Challenged by LE strengthening exercises. Printed HEP again for pt as she did not recall receiving last visit. Tx time was limited due to late arrival.   EVAL: Patient is a 82 year old female who presents with left knee pain and decreased endurance following a hospitalization.  She has pain with terminal knee extension.  She also has limited mobility of her patella.  She has increased pain when she is standing and walking.  She also has increased pain when she is sits for too long.  She has increased difficulty standing from a sitting position.  She has a decrease 5 times sit to stand time.  She would benefit from skilled therapy to decrease left knee pain, increase endurance, and increase general functional mobility. OBJECTIVE IMPAIRMENTS: decreased activity tolerance, decreased ROM, and pain.    ACTIVITY LIMITATIONS: locomotion level   PARTICIPATION LIMITATIONS: driving and recreational activities, normal physical activites   PERSONAL FACTORS: Age, Time since onset of injury/illness/exacerbation, and 3+ comorbidities: HLD, TIA, OA, CAD  are also affecting patient's functional outcome.    REHAB POTENTIAL: Good  CLINICAL DECISION MAKING: Evolving/moderate complexity recent hospitalization has caused a decline in general mobility   EVALUATION COMPLEXITY: Moderate   GOALS: Goals reviewed with patient? Yes  SHORT TERM GOALS: Target date: 09/16/2023   Patient will decrease TUG time by 10 sec  Baseline: Goal status: INITIAL  2.  Patient will increase bilateral LE strength by 5 lbs  Baseline:  Goal status: INITIAL  3.  Patient will demonstrate full PROM  into extension with the left knee without pain   Baseline:  Goal status: INITIAL   LONG TERM GOALS: Target date:  10/14/2023    Patient will transfer sit to stand without difficulty patient will ambulate community distances without difficulty goal status: INITIAL  2.  patient will ambulate community distances without difficulty  Baseline:  Goal status: INITIAL  3.  Patient will stand for greater than 20 minutes without increased left knee pain in order to perform ADLs Baseline:  Goal status: INITIAL  4.  Patient will go up and down stairs without difficulty Baseline:  Goal status: INITIAL     PLAN:  PT FREQUENCY: 1-2x/week  PT DURATION: 8 weeks can not start until October 2nd to   PLANNED INTERVENTIONS:  Therapeutic exercises, Therapeutic activity, Neuromuscular re-education, Balance training, Gait training, Patient/Family education, Self Care, Joint mobilization, Stair training, DME instructions, Aquatic Therapy, Dry Needling, Electrical stimulation, Cryotherapy, Moist heat, Taping, Manual therapy, and Re-evaluation.   PLAN FOR NEXT SESSION:  Consider joint mobilization increase terminal knee extension, work on patella mobility.  Consider straight leg raise, sit to stand transfers; consider nu-step and balance exercises; progress hip and knee exercises as tolerated.   Donnel Saxon Gaje Tennyson, PTA 10/06/2023, 1:16 PM

## 2023-10-13 ENCOUNTER — Encounter (HOSPITAL_BASED_OUTPATIENT_CLINIC_OR_DEPARTMENT_OTHER): Payer: Self-pay

## 2023-10-13 ENCOUNTER — Ambulatory Visit (HOSPITAL_BASED_OUTPATIENT_CLINIC_OR_DEPARTMENT_OTHER): Payer: Medicare Other

## 2023-10-13 DIAGNOSIS — R2689 Other abnormalities of gait and mobility: Secondary | ICD-10-CM | POA: Diagnosis not present

## 2023-10-13 DIAGNOSIS — G8929 Other chronic pain: Secondary | ICD-10-CM

## 2023-10-13 DIAGNOSIS — M25562 Pain in left knee: Secondary | ICD-10-CM | POA: Diagnosis not present

## 2023-10-13 DIAGNOSIS — M25561 Pain in right knee: Secondary | ICD-10-CM | POA: Diagnosis not present

## 2023-10-13 NOTE — Therapy (Addendum)
OUTPATIENT PHYSICAL THERAPY LOWER EXTREMITY TREATMENT   Patient Name: Nancy Vasquez MRN: [id number] DOB:03/14/1941, 82 y.o., female Today's Date: 10/13/2023  END OF SESSION:  PT End of Session - 10/13/23 1212     Visit Number 3    Number of Visits 16    Date for PT Re-Evaluation 11/25/23    Authorization Type BCBS    PT Start Time 1148    PT Stop Time 1230    PT Time Calculation (min) 42 min    Activity Tolerance Patient tolerated treatment well    Behavior During Therapy [facility] for tasks assessed/performed               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   Folliculitis 04/10/2023   Past Surgical History:  Procedure Laterality Date   ABDOMINAL HYSTERECTOMY     BREAST EXCISIONAL BIOPSY Left    CORONARY PRESSURE/FFR STUDY N/A 03/17/2017   Procedure: Intravascular Pressure Wire/FFR Study;  Surgeon: Rinaldo Cloud, MD;  Location: [facility] INVASIVE CV LAB;  Service: Cardiovascular;  Laterality: N/A;  mid LAD   LEFT HEART CATH AND CORONARY ANGIOGRAPHY N/A 03/17/2017   Procedure: Left Heart Cath and Coronary Angiography;  Surgeon: Rinaldo Cloud, MD;  Location: [facility] INVASIVE CV LAB;  Service: Cardiovascular;  Laterality: N/A;   TONSILLECTOMY     Removed as a child   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   Procedure: LEFT TOTAL KNEE ARTHROPLASTY;  Surgeon: Cammy Copa, MD;  Location: [facility] OR;  Service: Orthopedics;  Laterality: Left;   Patient Active Problem List   Diagnosis Date Noted   Sepsis (HCC) 07/09/2023   Acute encephalopathy 07/09/2023   Candida vaginitis 04/23/2023   Cellulitis 04/09/2023   TIA (transient ischemic attack) 03/17/2023   HLD (hyperlipidemia) 11/06/2022   Coronary artery disease 09/27/2022   Osteoarthritis of left knee 04/19/2020    PCP: Dr Hoy Register   REFERRING PROVIDER:  Dr Hoy Register  REFERRING DIAG:  Diagnosis   M17.12 (ICD-10-CM) - Primary osteoarthritis of left knee    THERAPY DIAG:  Chronic pain of left knee  Other abnormalities of gait and mobility  Chronic pain of right knee  Rationale for Evaluation and Treatment: Rehabilitation  ONSET DATE:    SUBJECTIVE:   SUBJECTIVE STATEMENT:  "Sunday morning when I wok up I couldn't even move my knee." 5/10 pain level at entry.  EVAL: Patient had a knee replacement 3 years prior on her left knee.  The knee was never completely pain-free.  She has had continuous pain since that point with activity and when she sits too long.  She had a fall at some point on the knee to which increase the pain.  She was in the hospital for sepsis on 07/09/2023.  Since that point she felt like she has decreased endurance with activity.  She has difficulty transferring from sit to stand.  She has also had a another increase in knee pain.  PERTINENT HISTORY: Right hip replacement, TIA patient reports she has had no residual side effects, left total knee replacement PAIN:  Are you having pain? Yes: NPRS scale: 5/10 Pain location: Left knee Pain description: Aching Aggravating factors: Standing, walking, sitting Relieving factors: Rest  PRECAUTIONS: None  RED FLAGS: None   WEIGHT BEARING RESTRICTIONS: No  FALLS:  Has patient fallen in last 6 months? No  LIVING ENVIRONMENT: 2 sets of  stairs inside the house.  OCCUPATION:  Retired   Presenter, broadcasting:  Building control surveyor      PLOF:   Independent with a cane since afall > 6 months ago   PATIENT GOALS:  Improve mobility/ decrease pain in both knees  NEXT MD VISIT:    OBJECTIVE:   DIAGNOSTIC FINDINGS:   PATIENT SURVEYS:  FOTO    COGNITION: Overall cognitive status: Within functional limits for tasks assessed     SENSATION: Burning down into the shin; Since her bout with sepsis her hands are numb   EDEMA:  Pocket of swelling in the inferior lateral knee    MUSCLE LENGTH:   POSTURE:  No Significant postural limitations  PALPATION: Tenderness to palpation   LOWER EXTREMITY ROM:  Active ROM Right 10/8 Left 10/8  Hip flexion    Hip extension    Hip abduction    Hip adduction    Hip internal rotation    Hip external rotation    Knee flexion 98 107  Knee extension -1 0  Ankle dorsiflexion    Ankle plantarflexion    Ankle inversion    Ankle eversion     (Blank rows = not tested)  LOWER EXTREMITY MMT:  MMT Right eval Left eval  Hip flexion 13.6 17.0  Hip extension    Hip abduction 14.3 15.2  Hip adduction    Hip internal rotation    Hip external rotation    Knee flexion    Knee extension 22.0 17.0  Ankle dorsiflexion    Ankle plantarflexion    Ankle inversion    Ankle eversion     (Blank rows = not tested)   5x sit to stand test 29 seconds   GAIT: Uses a cane in the right hand; decreased hip flexion; bilateral lateral weight shifting.  TODAY'S TREATMENT:                                                                                                                              DATE:     10/15: Tib/fem mobilizations posterior glide  Patellar mobilizations STM to L distal quad and ITB PROM LAQ 5" x10ea 2# Seated HSC RTB 2x10ea Seated marching 2x10 2#  10/8: Tib/fem mobilizations posterior glide  Patellar mobilizations PROM SLR x10 ea LAQ 5" x15ea Seated HSC YTB x10ea Seated marching 2x10   PATIENT EDUCATION:  Education details: HEP, symptom management, POC going forward Person educated: Patient Education method: Explanation, Demonstration, Tactile cues, Verbal cues, and Handouts Education comprehension: verbalized understanding, returned demonstration, verbal cues required, tactile cues required, and needs further education  HOME EXERCISE PROGRAM: Access Code: [email or web address] URL: [email or web address] Date: 08/20/2023 Prepared by: Lorayne Bender  Exercises - Long Sitting 4 Way Patellar Glide  - 1 x daily - 7 x  weekly - 3 sets - 10 reps - Supine Quad Set  - 1 x daily - 7 x weekly - 3 sets - 10 reps - Seated March  -  1 x daily - 7 x weekly - 3 sets - 10 reps - Seated Hip Abduction with Resistance  - 1 x daily - 7 x weekly - 3 sets - 10 reps  ASSESSMENT:  CLINICAL IMPRESSION: Pt reported improved pain level following gentle STM to distal quad and ITB in LLE. Palpable restrictions evident here which improved following MT. Good tolerance for progressions to therex program. Added 2# weights to seated strengthening as well as increased to RTB with seated HSC. Pt denied pain with exercises. She does continue to demonstrate antalgic and unsteady gait pattern using SPC. Decreased heel contact observed. Will consider gait training next session with SPC in L hand. Will continue to progress as tolerated towards goals.    EVAL: Patient is a 82 year old female who presents with left knee pain and decreased endurance following a hospitalization.  She has pain with terminal knee extension.  She also has limited mobility of her patella.  She has increased pain when she is standing and walking.  She also has increased pain when she is sits for too long.  She has increased difficulty standing from a sitting position.  She has a decrease 5 times sit to stand time.  She would benefit from skilled therapy to decrease left knee pain, increase endurance, and increase general functional mobility. OBJECTIVE IMPAIRMENTS: decreased activity tolerance, decreased ROM, and pain.    ACTIVITY LIMITATIONS: locomotion level   PARTICIPATION LIMITATIONS: driving and recreational activities, normal physical activites   PERSONAL FACTORS: Age, Time since onset of injury/illness/exacerbation, and 3+ comorbidities: HLD, TIA, OA, CAD  are also affecting patient's functional outcome.    REHAB POTENTIAL: Good  CLINICAL DECISION MAKING: Evolving/moderate complexity recent hospitalization has caused a decline in general mobility   EVALUATION  COMPLEXITY: Moderate   GOALS: Goals reviewed with patient? Yes  SHORT TERM GOALS: Target date: 09/16/2023   Patient will decrease TUG time by 10 sec  Baseline: Goal status: INITIAL  2.  Patient will increase bilateral LE strength by 5 lbs  Baseline:  Goal status: INITIAL  3.  Patient will demonstrate full PROM into extension with the left knee without pain   Baseline:  Goal status: INITIAL   LONG TERM GOALS: Target date:  10/14/2023    Patient will transfer sit to stand without difficulty patient will ambulate community distances without difficulty goal status: INITIAL  2.  patient will ambulate community distances without difficulty  Baseline:  Goal status: INITIAL  3.  Patient will stand for greater than 20 minutes without increased left knee pain in order to perform ADLs Baseline:  Goal status: INITIAL  4.  Patient will go up and down stairs without difficulty Baseline:  Goal status: INITIAL     PLAN:  PT FREQUENCY: 1-2x/week  PT DURATION: 8 weeks can not start until October 2nd to   PLANNED INTERVENTIONS:  Therapeutic exercises, Therapeutic activity, Neuromuscular re-education, Balance training, Gait training, Patient/Family education, Self Care, Joint mobilization, Stair training, DME instructions, Aquatic Therapy, Dry Needling, Electrical stimulation, Cryotherapy, Moist heat, Taping, Manual therapy, and Re-evaluation.   PLAN FOR NEXT SESSION:  Consider joint mobilization increase terminal knee extension, work on patella mobility.  Consider straight leg raise, sit to stand transfers; consider nu-step and balance exercises; progress hip and knee exercises as tolerated.   Donnel Saxon Yoshio Seliga, PTA 10/13/2023, 1:09 PM

## 2023-10-16 ENCOUNTER — Encounter (HOSPITAL_BASED_OUTPATIENT_CLINIC_OR_DEPARTMENT_OTHER): Payer: Medicare Other | Admitting: Physical Therapy

## 2023-10-19 ENCOUNTER — Telehealth: Payer: Self-pay | Admitting: Internal Medicine

## 2023-10-20 ENCOUNTER — Ambulatory Visit (HOSPITAL_BASED_OUTPATIENT_CLINIC_OR_DEPARTMENT_OTHER): Payer: Medicare Other | Admitting: Physical Therapy

## 2023-10-20 DIAGNOSIS — I2510 Atherosclerotic heart disease of native coronary artery without angina pectoris: Secondary | ICD-10-CM | POA: Diagnosis not present

## 2023-10-20 DIAGNOSIS — M25562 Pain in left knee: Secondary | ICD-10-CM | POA: Diagnosis not present

## 2023-10-20 DIAGNOSIS — R2689 Other abnormalities of gait and mobility: Secondary | ICD-10-CM | POA: Diagnosis not present

## 2023-10-20 DIAGNOSIS — I10 Essential (primary) hypertension: Secondary | ICD-10-CM | POA: Diagnosis not present

## 2023-10-20 DIAGNOSIS — G8929 Other chronic pain: Secondary | ICD-10-CM

## 2023-10-20 DIAGNOSIS — M25561 Pain in right knee: Secondary | ICD-10-CM | POA: Diagnosis not present

## 2023-10-20 DIAGNOSIS — R0789 Other chest pain: Secondary | ICD-10-CM | POA: Diagnosis not present

## 2023-10-20 DIAGNOSIS — E782 Mixed hyperlipidemia: Secondary | ICD-10-CM | POA: Diagnosis not present

## 2023-10-20 NOTE — Therapy (Signed)
OUTPATIENT PHYSICAL THERAPY LOWER EXTREMITY TREATMENT   Patient Name: Nancy Vasquez MRN: [id number] DOB:[birth year], 82 y.o., female Today's Date: 10/20/2023  END OF SESSION:      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   Folliculitis 04/10/2023   Past Surgical History:  Procedure Laterality Date   ABDOMINAL HYSTERECTOMY     BREAST EXCISIONAL BIOPSY Left    CORONARY PRESSURE/FFR STUDY N/A 03/17/2017   Procedure: Intravascular Pressure Wire/FFR Study;  Surgeon: Rinaldo Cloud, MD;  Location: [facility] INVASIVE CV LAB;  Service: Cardiovascular;  Laterality: N/A;  mid LAD   LEFT HEART CATH AND CORONARY ANGIOGRAPHY N/A 03/17/2017   Procedure: Left Heart Cath and Coronary Angiography;  Surgeon: Rinaldo Cloud, MD;  Location: [facility] INVASIVE CV LAB;  Service: Cardiovascular;  Laterality: N/A;   TONSILLECTOMY     Removed as a child   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   Procedure: LEFT TOTAL KNEE ARTHROPLASTY;  Surgeon: Cammy Copa, MD;  Location: [facility] OR;  Service: Orthopedics;  Laterality: Left;   Patient Active Problem List   Diagnosis Date Noted   Sepsis (HCC) 07/09/2023   Acute encephalopathy 07/09/2023   Candida vaginitis 04/23/2023   Cellulitis 04/09/2023   TIA (transient ischemic attack) 03/17/2023   HLD (hyperlipidemia) 11/06/2022   Coronary artery disease 09/27/2022   Osteoarthritis of left knee 04/19/2020    PCP: Dr Hoy Register   REFERRING PROVIDER:  Dr Hoy Register  REFERRING DIAG:  Diagnosis  M17.12 (ICD-10-CM) - Primary osteoarthritis of left knee    THERAPY DIAG:  No diagnosis found.  Rationale for Evaluation and Treatment: Rehabilitation  ONSET DATE:    SUBJECTIVE:   SUBJECTIVE STATEMENT:  She reports the leg continues to be sore. She feels like it may be a little better but it is very sore still.  EVAL: Patient had a knee  replacement 3 years prior on her left knee.  The knee was never completely pain-free.  She has had continuous pain since that point with activity and when she sits too long.  She had a fall at some point on the knee to which increase the pain.  She was in the hospital for sepsis on 07/09/2023.  Since that point she felt like she has decreased endurance with activity.  She has difficulty transferring from sit to stand.  She has also had a another increase in knee pain.  PERTINENT HISTORY: Right hip replacement, TIA patient reports she has had no residual side effects, left total knee replacement PAIN:  Are you having pain? Yes: NPRS scale: 5/10 Pain location: Left knee Pain description: Aching Aggravating factors: Standing, walking, sitting Relieving factors: Rest  PRECAUTIONS: None  RED FLAGS: None   WEIGHT BEARING RESTRICTIONS: No  FALLS:  Has patient fallen in last 6 months? No  LIVING ENVIRONMENT: 2 sets of stairs inside the house.  OCCUPATION:  Retired   Presenter, broadcasting:  Building control surveyor      PLOF:   Independent with a cane since afall > 6 months ago   PATIENT GOALS:  Improve mobility/ decrease pain in both knees  NEXT MD VISIT:    OBJECTIVE:   DIAGNOSTIC FINDINGS:   PATIENT SURVEYS:  FOTO    COGNITION: Overall cognitive status: Within functional limits for tasks assessed     SENSATION: Burning down into the shin; Since her bout with sepsis her hands are numb  EDEMA:  Pocket of swelling in the inferior lateral knee    MUSCLE LENGTH:   POSTURE: No Significant postural limitations  PALPATION: Tenderness to palpation   LOWER EXTREMITY ROM:  Active ROM Right 10/8 Left 10/8  Hip flexion    Hip extension    Hip abduction    Hip adduction    Hip internal rotation    Hip external rotation    Knee flexion 98 107  Knee extension -1 0  Ankle dorsiflexion    Ankle plantarflexion    Ankle inversion    Ankle eversion     (Blank rows = not  tested)  LOWER EXTREMITY MMT:  MMT Right eval Left eval  Hip flexion 13.6 17.0  Hip extension    Hip abduction 14.3 15.2  Hip adduction    Hip internal rotation    Hip external rotation    Knee flexion    Knee extension 22.0 17.0  Ankle dorsiflexion    Ankle plantarflexion    Ankle inversion    Ankle eversion     (Blank rows = not tested)   5x sit to stand test 29 seconds   GAIT: Uses a cane in the right hand; decreased hip flexion; bilateral lateral weight shifting.  TODAY'S TREATMENT:                                                                                                                              DATE:    10/22  Manual: trigger point release to left qud; PA glide/ AP glide to left knee for pain relief and extension.   Quad set 2x10    Halted 2nd to report of chest tightness.     10/15: Tib/fem mobilizations posterior glide  Patellar mobilizations STM to L distal quad and ITB PROM LAQ 5" x10ea 2# Seated HSC RTB 2x10ea Seated marching 2x10 2#  10/8: Tib/fem mobilizations posterior glide  Patellar mobilizations PROM SLR x10 ea LAQ 5" x15ea Seated HSC YTB x10ea Seated marching 2x10   PATIENT EDUCATION:  Education details: HEP, symptom management, POC going forward Person educated: Patient Education method: Explanation, Demonstration, Tactile cues, Verbal cues, and Handouts Education comprehension: verbalized understanding, returned demonstration, verbal cues required, tactile cues required, and needs further education  HOME EXERCISE PROGRAM: Access Code: [email or web address] URL: [email or web address] Date: 08/20/2023 Prepared by: Lorayne Bender  Exercises - Long Sitting 4 Way Patellar Glide  - 1 x daily - 7 x weekly - 3 sets - 10 reps - Supine Quad Set  - 1 x daily - 7 x weekly - 3 sets - 10 reps - Seated March  - 1 x daily - 7 x weekly - 3 sets - 10 reps - Seated Hip Abduction with Resistance  - 1 x daily - 7 x weekly - 3 sets -  10 reps  ASSESSMENT:  CLINICAL IMPRESSION: Patient had bilateral leg pain coming into treatment today.  She completed NuStep with no complaints.  Therapy  perform manual therapy on her legs to reduce pain and inflammation.  The patient began exercise with quad sets.  During quad sets the patient reports that over the past week or so during exercises she has had increased pressure in her chest.  She reports no pain just pressure.  When the exercise stops the pressure goes away.  She had no visible signs of distress.  She is advised she should seek medical attention.  She reports she will go home and call her cardiologist right away.  She has a cardiologist appointment next week.  Therapy advised her not to wait until next week.  She is advised to call them right now.  She reported no chest pain or tightness when leaving.  She was advised not to do her exercises at this time until checked out by cardiology.  She is advised to cancel her appointment next week if cardiology does not feel the need to see her before next week.  Will not resume physical therapy until she is checked out by cardiologist.  EVAL: Patient is a 82 year old female who presents with left knee pain and decreased endurance following a hospitalization.  She has pain with terminal knee extension.  She also has limited mobility of her patella.  She has increased pain when she is standing and walking.  She also has increased pain when she is sits for too long.  She has increased difficulty standing from a sitting position.  She has a decrease 5 times sit to stand time.  She would benefit from skilled therapy to decrease left knee pain, increase endurance, and increase general functional mobility. OBJECTIVE IMPAIRMENTS: decreased activity tolerance, decreased ROM, and pain.    ACTIVITY LIMITATIONS: locomotion level   PARTICIPATION LIMITATIONS: driving and recreational activities, normal physical activites   PERSONAL FACTORS: Age, Time since  onset of injury/illness/exacerbation, and 3+ comorbidities: HLD, TIA, OA, CAD  are also affecting patient's functional outcome.    REHAB POTENTIAL: Good  CLINICAL DECISION MAKING: Evolving/moderate complexity recent hospitalization has caused a decline in general mobility   EVALUATION COMPLEXITY: Moderate   GOALS: Goals reviewed with patient? Yes  SHORT TERM GOALS: Target date: 09/16/2023   Patient will decrease TUG time by 10 sec  Baseline: Goal status: INITIAL  2.  Patient will increase bilateral LE strength by 5 lbs  Baseline:  Goal status: INITIAL  3.  Patient will demonstrate full PROM into extension with the left knee without pain   Baseline:  Goal status: INITIAL   LONG TERM GOALS: Target date:  10/14/2023    Patient will transfer sit to stand without difficulty patient will ambulate community distances without difficulty goal status: INITIAL  2.  patient will ambulate community distances without difficulty  Baseline:  Goal status: INITIAL  3.  Patient will stand for greater than 20 minutes without increased left knee pain in order to perform ADLs Baseline:  Goal status: INITIAL  4.  Patient will go up and down stairs without difficulty Baseline:  Goal status: INITIAL     PLAN:  PT FREQUENCY: 1-2x/week  PT DURATION: 8 weeks can not start until October 2nd to   PLANNED INTERVENTIONS:  Therapeutic exercises, Therapeutic activity, Neuromuscular re-education, Balance training, Gait training, Patient/Family education, Self Care, Joint mobilization, Stair training, DME instructions, Aquatic Therapy, Dry Needling, Electrical stimulation, Cryotherapy, Moist heat, Taping, Manual therapy, and Re-evaluation.   PLAN FOR NEXT SESSION:  Consider joint mobilization increase terminal knee extension, work on patella mobility.  Consider straight leg raise,  sit to stand transfers; consider nu-step and balance exercises; progress hip and knee exercises as tolerated.    Dessie Coma, PT 10/20/2023, 2:27 PM

## 2023-10-21 DIAGNOSIS — E559 Vitamin D deficiency, unspecified: Secondary | ICD-10-CM | POA: Diagnosis not present

## 2023-10-21 DIAGNOSIS — E785 Hyperlipidemia, unspecified: Secondary | ICD-10-CM | POA: Diagnosis not present

## 2023-10-21 DIAGNOSIS — I10 Essential (primary) hypertension: Secondary | ICD-10-CM | POA: Diagnosis not present

## 2023-10-27 ENCOUNTER — Encounter (HOSPITAL_BASED_OUTPATIENT_CLINIC_OR_DEPARTMENT_OTHER): Payer: Self-pay | Admitting: Physical Therapy

## 2023-10-27 ENCOUNTER — Ambulatory Visit (HOSPITAL_BASED_OUTPATIENT_CLINIC_OR_DEPARTMENT_OTHER): Payer: Medicare Other | Admitting: Physical Therapy

## 2023-10-27 DIAGNOSIS — R2689 Other abnormalities of gait and mobility: Secondary | ICD-10-CM

## 2023-10-27 DIAGNOSIS — G8929 Other chronic pain: Secondary | ICD-10-CM

## 2023-10-27 DIAGNOSIS — M25562 Pain in left knee: Secondary | ICD-10-CM | POA: Diagnosis not present

## 2023-10-27 DIAGNOSIS — M25561 Pain in right knee: Secondary | ICD-10-CM | POA: Diagnosis not present

## 2023-10-27 NOTE — Therapy (Signed)
OUTPATIENT PHYSICAL THERAPY LOWER EXTREMITY TREATMENT   Patient Name: Nancy Vasquez MRN: [id number] DOB:[birth year], 82 y.o., female Today's Date: 10/27/2023  END OF SESSION:      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   Folliculitis 04/10/2023   Past Surgical History:  Procedure Laterality Date   ABDOMINAL HYSTERECTOMY     BREAST EXCISIONAL BIOPSY Left    CORONARY PRESSURE/FFR STUDY N/A 03/17/2017   Procedure: Intravascular Pressure Wire/FFR Study;  Surgeon: Rinaldo Cloud, MD;  Location: [facility] INVASIVE CV LAB;  Service: Cardiovascular;  Laterality: N/A;  mid LAD   LEFT HEART CATH AND CORONARY ANGIOGRAPHY N/A 03/17/2017   Procedure: Left Heart Cath and Coronary Angiography;  Surgeon: Rinaldo Cloud, MD;  Location: [facility] INVASIVE CV LAB;  Service: Cardiovascular;  Laterality: N/A;   TONSILLECTOMY     Removed as a child   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   Procedure: LEFT TOTAL KNEE ARTHROPLASTY;  Surgeon: Cammy Copa, MD;  Location: [facility] OR;  Service: Orthopedics;  Laterality: Left;   Patient Active Problem List   Diagnosis Date Noted   Sepsis (HCC) 07/09/2023   Acute encephalopathy 07/09/2023   Candida vaginitis 04/23/2023   Cellulitis 04/09/2023   TIA (transient ischemic attack) 03/17/2023   HLD (hyperlipidemia) 11/06/2022   Coronary artery disease 09/27/2022   Osteoarthritis of left knee 04/19/2020    PCP: Dr Hoy Register   REFERRING PROVIDER:  Dr Hoy Register  REFERRING DIAG:  Diagnosis  M17.12 (ICD-10-CM) - Primary osteoarthritis of left knee    THERAPY DIAG:  No diagnosis found.  Rationale for Evaluation and Treatment: Rehabilitation  ONSET DATE:    SUBJECTIVE:   SUBJECTIVE STATEMENT: Pt reports her legs continue to be sore, L>R and her chest tightness has decreased from how she felt last session.  Pt reports her  cardiologist told her the pain was caused by a muscle. He performed an EKG per patient.    EVAL: Patient had a knee replacement 3 years prior on her left knee.  The knee was never completely pain-free.  She has had continuous pain since that point with activity and when she sits too long.  She had a fall at some point on the knee to which increase the pain.  She was in the hospital for sepsis on 07/09/2023.  Since that point she felt like she has decreased endurance with activity.  She has difficulty transferring from sit to stand.  She has also had a another increase in knee pain.  PERTINENT HISTORY: Right hip replacement, TIA patient reports she has had no residual side effects, left total knee replacement PAIN:  Are you having pain? Yes: NPRS scale: 5/10 Pain location: Left knee Pain description: Aching Aggravating factors: Standing, walking, sitting Relieving factors: Rest  PRECAUTIONS: None  RED FLAGS: None   WEIGHT BEARING RESTRICTIONS: No  FALLS:  Has patient fallen in last 6 months? No  LIVING ENVIRONMENT: 2 sets of stairs inside the house.  OCCUPATION:  Retired   Presenter, broadcasting:  Building control surveyor      PLOF:   Independent with a cane since afall > 6 months ago   PATIENT GOALS:  Improve mobility/ decrease pain in both knees  NEXT MD VISIT:    OBJECTIVE:   DIAGNOSTIC FINDINGS:   PATIENT SURVEYS:  FOTO    COGNITION: Overall cognitive status: Within functional limits for tasks assessed  SENSATION: Burning down into the shin; Since her bout with sepsis her hands are numb   EDEMA:  Pocket of swelling in the inferior lateral knee    MUSCLE LENGTH:   POSTURE: No Significant postural limitations  PALPATION: Tenderness to palpation   LOWER EXTREMITY ROM:  Active ROM Right 10/8 Left 10/8  Hip flexion    Hip extension    Hip abduction    Hip adduction    Hip internal rotation    Hip external rotation    Knee flexion 98 107  Knee extension  -1 0  Ankle dorsiflexion    Ankle plantarflexion    Ankle inversion    Ankle eversion     (Blank rows = not tested)  LOWER EXTREMITY MMT:  MMT Right eval Left eval  Hip flexion 13.6 17.0  Hip extension    Hip abduction 14.3 15.2  Hip adduction    Hip internal rotation    Hip external rotation    Knee flexion    Knee extension 22.0 17.0  Ankle dorsiflexion    Ankle plantarflexion    Ankle inversion    Ankle eversion     (Blank rows = not tested)   5x sit to stand test 29 seconds   GAIT: Uses a cane in the right hand; decreased hip flexion; bilateral lateral weight shifting.  TODAY'S TREATMENT:                                                                                                                              DATE:   10/27/2023  Quad sets x10  Manual: patella mobilizations to Bil knees, STM to bil distal ITB,   SLR: L- x5, R- x10  Bridges: 2x10  10/22  Manual: trigger point release to left qud; PA glide/ AP glide to left knee for pain relief and extension.   Quad set 2x10    Halted 2nd to report of chest tightness.     10/15: Tib/fem mobilizations posterior glide  Patellar mobilizations STM to L distal quad and ITB PROM LAQ 5" x10ea 2# Seated HSC RTB 2x10ea Seated marching 2x10 2#  10/8: Tib/fem mobilizations posterior glide  Patellar mobilizations PROM SLR x10 ea LAQ 5" x15ea Seated HSC YTB x10ea Seated marching 2x10   PATIENT EDUCATION:  Education details: HEP, symptom management, POC going forward Person educated: Patient Education method: Explanation, Demonstration, Tactile cues, Verbal cues, and Handouts Education comprehension: verbalized understanding, returned demonstration, verbal cues required, tactile cues required, and needs further education  HOME EXERCISE PROGRAM: Access Code: [email or web address] URL: [email or web address] Date: 08/20/2023 Prepared by: Lorayne Bender  Exercises - Long Sitting 4 Way Patellar Glide   - 1 x daily - 7 x weekly - 3 sets - 10 reps - Supine Quad Set  - 1 x daily - 7 x weekly - 3 sets - 10 reps - Seated March  - 1 x daily - 7 x weekly - 3 sets - 10 reps -  Seated Hip Abduction with Resistance  - 1 x daily - 7 x weekly - 3 sets - 10 reps  ASSESSMENT:  CLINICAL IMPRESSION: The patient did not have chest pain today, but she was limited with exercises secondary to pain in both knees L>R which increased the time required for her to complete exercises. Pt presents with palpable distal ITB tightness in the L knee and quad weakness. Continue to focus on graded quad strengthening interventions as tolerated. We transition some of the patients exercises to the pool to see if we have more ability to perform strengthening exercises.   EVAL: Patient is a 82 year old female who presents with left knee pain and decreased endurance following a hospitalization.  She has pain with terminal knee extension.  She also has limited mobility of her patella.  She has increased pain when she is standing and walking.  She also has increased pain when she is sits for too long.  She has increased difficulty standing from a sitting position.  She has a decrease 5 times sit to stand time.  She would benefit from skilled therapy to decrease left knee pain, increase endurance, and increase general functional mobility. OBJECTIVE IMPAIRMENTS: decreased activity tolerance, decreased ROM, and pain.    ACTIVITY LIMITATIONS: locomotion level   PARTICIPATION LIMITATIONS: driving and recreational activities, normal physical activites   PERSONAL FACTORS: Age, Time since onset of injury/illness/exacerbation, and 3+ comorbidities: HLD, TIA, OA, CAD  are also affecting patient's functional outcome.    REHAB POTENTIAL: Good  CLINICAL DECISION MAKING: Evolving/moderate complexity recent hospitalization has caused a decline in general mobility   EVALUATION COMPLEXITY: Moderate   GOALS: Goals reviewed with patient?  Yes  SHORT TERM GOALS: Target date: 09/16/2023   Patient will decrease TUG time by 10 sec  Baseline: Goal status: INITIAL  2.  Patient will increase bilateral LE strength by 5 lbs  Baseline:  Goal status: INITIAL  3.  Patient will demonstrate full PROM into extension with the left knee without pain   Baseline:  Goal status: INITIAL   LONG TERM GOALS: Target date:  10/14/2023    Patient will transfer sit to stand without difficulty patient will ambulate community distances without difficulty goal status: INITIAL  2.  patient will ambulate community distances without difficulty  Baseline:  Goal status: INITIAL  3.  Patient will stand for greater than 20 minutes without increased left knee pain in order to perform ADLs Baseline:  Goal status: INITIAL  4.  Patient will go up and down stairs without difficulty Baseline:  Goal status: INITIAL     PLAN:  PT FREQUENCY: 1-2x/week  PT DURATION: 8 weeks can not start until October 2nd to   PLANNED INTERVENTIONS:  Therapeutic exercises, Therapeutic activity, Neuromuscular re-education, Balance training, Gait training, Patient/Family education, Self Care, Joint mobilization, Stair training, DME instructions, Aquatic Therapy, Dry Needling, Electrical stimulation, Cryotherapy, Moist heat, Taping, Manual therapy, and Re-evaluation.   PLAN FOR NEXT SESSION:  Consider joint mobilization increase terminal knee extension, work on patella mobility.  Consider straight leg raise, sit to stand transfers; consider nu-step and balance exercises; progress hip and knee exercises as tolerated.   Dessie Coma, PT 10/27/2023, 2:48 PM

## 2023-11-02 ENCOUNTER — Ambulatory Visit: Payer: Medicare Other | Admitting: Family Medicine

## 2023-11-02 ENCOUNTER — Telehealth: Payer: Self-pay | Admitting: Family Medicine

## 2023-11-02 NOTE — Telephone Encounter (Signed)
Copied from CRM [id number]. Topic: Referral - Status >> Nov 02, 2023  9:24 AM Macon Large wrote: Reason for CRM: Pt requests to have a MRI of the left knee. Pt stated that she has an appt on 11/11/23 for bone density and she like to have the MRI on the same day if possible. Cb# [phone]

## 2023-11-02 NOTE — Telephone Encounter (Signed)
Pt was called and VM was left informing patient that she has an appointment on 11/17/2023 and she can discuss her concerns then.

## 2023-11-05 ENCOUNTER — Ambulatory Visit (HOSPITAL_BASED_OUTPATIENT_CLINIC_OR_DEPARTMENT_OTHER): Payer: Medicare Other | Attending: Family Medicine | Admitting: Physical Therapy

## 2023-11-05 DIAGNOSIS — G8929 Other chronic pain: Secondary | ICD-10-CM | POA: Insufficient documentation

## 2023-11-05 DIAGNOSIS — M25562 Pain in left knee: Secondary | ICD-10-CM | POA: Insufficient documentation

## 2023-11-05 DIAGNOSIS — M25561 Pain in right knee: Secondary | ICD-10-CM | POA: Insufficient documentation

## 2023-11-05 DIAGNOSIS — R2689 Other abnormalities of gait and mobility: Secondary | ICD-10-CM | POA: Insufficient documentation

## 2023-11-07 ENCOUNTER — Ambulatory Visit (HOSPITAL_BASED_OUTPATIENT_CLINIC_OR_DEPARTMENT_OTHER): Payer: Medicare Other | Attending: Family Medicine | Admitting: Rehabilitative and Restorative Service Providers"

## 2023-11-07 ENCOUNTER — Encounter (HOSPITAL_BASED_OUTPATIENT_CLINIC_OR_DEPARTMENT_OTHER): Payer: Self-pay | Admitting: Rehabilitative and Restorative Service Providers"

## 2023-11-07 DIAGNOSIS — R2689 Other abnormalities of gait and mobility: Secondary | ICD-10-CM | POA: Diagnosis not present

## 2023-11-07 DIAGNOSIS — G8929 Other chronic pain: Secondary | ICD-10-CM

## 2023-11-07 DIAGNOSIS — M25561 Pain in right knee: Secondary | ICD-10-CM | POA: Diagnosis not present

## 2023-11-07 DIAGNOSIS — M25562 Pain in left knee: Secondary | ICD-10-CM | POA: Diagnosis not present

## 2023-11-07 NOTE — Therapy (Signed)
OUTPATIENT PHYSICAL THERAPY LOWER EXTREMITY TREATMENT   Patient Name: Nancy Vasquez MRN: [id number] DOB:1941/02/16, 82 y.o., female Today's Date: 11/07/2023  END OF SESSION:  PT End of Session - 11/07/23 1101     Visit Number 6    Number of Visits 16    Date for PT Re-Evaluation 11/25/23    Authorization Type BCBS    PT Start Time 1053    PT Stop Time 1140    PT Time Calculation (min) 47 min    Activity Tolerance Patient tolerated treatment well    Behavior During Therapy [facility] for tasks assessed/performed                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   Folliculitis 04/10/2023   Past Surgical History:  Procedure Laterality Date   ABDOMINAL HYSTERECTOMY     BREAST EXCISIONAL BIOPSY Left    CORONARY PRESSURE/FFR STUDY N/A 03/17/2017   Procedure: Intravascular Pressure Wire/FFR Study;  Surgeon: Rinaldo Cloud, MD;  Location: [facility] INVASIVE CV LAB;  Service: Cardiovascular;  Laterality: N/A;  mid LAD   LEFT HEART CATH AND CORONARY ANGIOGRAPHY N/A 03/17/2017   Procedure: Left Heart Cath and Coronary Angiography;  Surgeon: Rinaldo Cloud, MD;  Location: [facility] INVASIVE CV LAB;  Service: Cardiovascular;  Laterality: N/A;   TONSILLECTOMY     Removed as a child   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   Procedure: LEFT TOTAL KNEE ARTHROPLASTY;  Surgeon: Cammy Copa, MD;  Location: [facility] OR;  Service: Orthopedics;  Laterality: Left;   Patient Active Problem List   Diagnosis Date Noted   Sepsis (HCC) 07/09/2023   Acute encephalopathy 07/09/2023   Candida vaginitis 04/23/2023   Cellulitis 04/09/2023   TIA (transient ischemic attack) 03/17/2023   HLD (hyperlipidemia) 11/06/2022   Coronary artery disease 09/27/2022   Osteoarthritis of left knee 04/19/2020    PCP: Dr Hoy Register   REFERRING PROVIDER:  Dr Hoy Register  REFERRING DIAG:  Diagnosis   M17.12 (ICD-10-CM) - Primary osteoarthritis of left knee    THERAPY DIAG:  Chronic pain of left knee  Other abnormalities of gait and mobility  Chronic pain of right knee  Rationale for Evaluation and Treatment: Rehabilitation  ONSET DATE:    SUBJECTIVE:   SUBJECTIVE STATEMENT: I found this (knee strap) and it has been helpful (Mueller knee strap across patella tendon L). I have tried it on my right too and it is really helping with the knee pain.    EVAL: Patient had a knee replacement 3 years prior on her left knee.  The knee was never completely pain-free.  She has had continuous pain since that point with activity and when she sits too long.  She had a fall at some point on the knee to which increase the pain.  She was in the hospital for sepsis on 07/09/2023.  Since that point she felt like she has decreased endurance with activity.  She has difficulty transferring from sit to stand.  She has also had a another increase in knee pain.  PERTINENT HISTORY: Right hip replacement, TIA patient reports she has had no residual side effects, left total knee replacement PAIN:  Are you having pain? Yes: NPRS scale: 5/10 Pain location: Left knee Pain description: Aching Aggravating factors: Standing, walking, sitting Relieving factors: Rest  PRECAUTIONS: None  RED FLAGS: None   WEIGHT BEARING  RESTRICTIONS: No  FALLS:  Has patient fallen in last 6 months? No  LIVING ENVIRONMENT: 2 sets of stairs inside the house.  OCCUPATION:  Retired   Presenter, broadcasting:  Building control surveyor      PLOF:   Independent with a cane since afall > 6 months ago   PATIENT GOALS:  Improve mobility/ decrease pain in both knees  NEXT MD VISIT:    OBJECTIVE:   DIAGNOSTIC FINDINGS:   PATIENT SURVEYS:  FOTO    COGNITION: Overall cognitive status: Within functional limits for tasks assessed     SENSATION: Burning down into the shin; Since her bout with sepsis her hands are numb    EDEMA:  Pocket of swelling in the inferior lateral knee    MUSCLE LENGTH:   POSTURE: No Significant postural limitations  PALPATION: Tenderness to palpation   LOWER EXTREMITY ROM:  Active ROM Right 10/8 Left 10/8  Hip flexion    Hip extension    Hip abduction    Hip adduction    Hip internal rotation    Hip external rotation    Knee flexion 98 107  Knee extension -1 0  Ankle dorsiflexion    Ankle plantarflexion    Ankle inversion    Ankle eversion     (Blank rows = not tested)  LOWER EXTREMITY MMT:  MMT Right eval Left eval  Hip flexion 13.6 17.0  Hip extension    Hip abduction 14.3 15.2  Hip adduction    Hip internal rotation    Hip external rotation    Knee flexion    Knee extension 22.0 17.0  Ankle dorsiflexion    Ankle plantarflexion    Ankle inversion    Ankle eversion     (Blank rows = not tested)   5x sit to stand test 29 seconds   GAIT: Uses a cane in the right hand; decreased hip flexion; bilateral lateral weight shifting.  TODAY'S TREATMENT:                                                                                                                              DATE:    11/07/23  -Quad sets x 20 with towel roll ea side -heel slides with usage of pillow case x 20 making sure to keep knee in line with hip -SAQ x 20 -SLR x 10 -Pt needed extended time to perform therex due to knee stiffness and initial discomfort performing exercises which improved with increased repetitions -Manual Patella mobs to bil knees with pt reports of it feeling good -PT also spoke with pt about standing up more evenly vs swaying legs to the side and pushing off to stand using cane making her asymmetrically weightbear. She performed as recommended and stated legs to feel much better.    10/27/23  Quad sets x10  Manual: patella mobilizations to Bil knees, STM to bil distal ITB,   SLR: L- x5, R- x10  Bridges: 2x10  10/22  Manual: trigger point release to  left qud; PA glide/ AP glide to left knee for pain relief and extension.   Quad set 2x10    Halted 2nd to report of chest tightness.     10/15: Tib/fem mobilizations posterior glide  Patellar mobilizations STM to L distal quad and ITB PROM LAQ 5" x10ea 2# Seated HSC RTB 2x10ea Seated marching 2x10 2#  10/8: Tib/fem mobilizations posterior glide  Patellar mobilizations PROM SLR x10 ea LAQ 5" x15ea Seated HSC YTB x10ea Seated marching 2x10   PATIENT EDUCATION:  Education details: HEP, symptom management, POC going forward Person educated: Patient Education method: Explanation, Demonstration, Tactile cues, Verbal cues, and Handouts Education comprehension: verbalized understanding, returned demonstration, verbal cues required, tactile cues required, and needs further education  HOME EXERCISE PROGRAM: Access Code: [email or web address] URL: [email or web address] Date: 08/20/2023 Prepared by: Lorayne Bender  Exercises - Long Sitting 4 Way Patellar Glide  - 1 x daily - 7 x weekly - 3 sets - 10 reps - Supine Quad Set  - 1 x daily - 7 x weekly - 3 sets - 10 reps - Seated March  - 1 x daily - 7 x weekly - 3 sets - 10 reps - Seated Hip Abduction with Resistance  - 1 x daily - 7 x weekly - 3 sets - 10 reps  ASSESSMENT:  CLINICAL IMPRESSION:  Pt arrived to PT this visit with L Mueller knee strap on L patella tendon. She reports the knee pain to be much improved with usage of the strap. Pt did well with the progression of therex today without increased pain. She is very excited about beginning water therapy. Pt would continue to benefit from graded quad strengthening exercises as needed.    EVAL: Patient is a 82 year old female who presents with left knee pain and decreased endurance following a hospitalization.  She has pain with terminal knee extension.  She also has limited mobility of her patella.  She has increased pain when she is standing and walking.  She also has  increased pain when she is sits for too long.  She has increased difficulty standing from a sitting position.  She has a decrease 5 times sit to stand time.  She would benefit from skilled therapy to decrease left knee pain, increase endurance, and increase general functional mobility. OBJECTIVE IMPAIRMENTS: decreased activity tolerance, decreased ROM, and pain.    ACTIVITY LIMITATIONS: locomotion level   PARTICIPATION LIMITATIONS: driving and recreational activities, normal physical activites   PERSONAL FACTORS: Age, Time since onset of injury/illness/exacerbation, and 3+ comorbidities: HLD, TIA, OA, CAD  are also affecting patient's functional outcome.    REHAB POTENTIAL: Good  CLINICAL DECISION MAKING: Evolving/moderate complexity recent hospitalization has caused a decline in general mobility   EVALUATION COMPLEXITY: Moderate   GOALS: Goals reviewed with patient? Yes  SHORT TERM GOALS: Target date: 09/16/2023   Patient will decrease TUG time by 10 sec  Baseline: Goal status: INITIAL  2.  Patient will increase bilateral LE strength by 5 lbs  Baseline:  Goal status: INITIAL  3.  Patient will demonstrate full PROM into extension with the left knee without pain   Baseline:  Goal status: INITIAL   LONG TERM GOALS: Target date:  10/14/2023    Patient will transfer sit to stand without difficulty patient will ambulate community distances without difficulty goal status: INITIAL  2.  patient will ambulate community distances without difficulty  Baseline:  Goal status: INITIAL  3.  Patient will  stand for greater than 20 minutes without increased left knee pain in order to perform ADLs Baseline:  Goal status: INITIAL  4.  Patient will go up and down stairs without difficulty Baseline:  Goal status: INITIAL     PLAN:  PT FREQUENCY: 1-2x/week  PT DURATION: 8 weeks can not start until October 2nd to   PLANNED INTERVENTIONS:  Therapeutic exercises, Therapeutic  activity, Neuromuscular re-education, Balance training, Gait training, Patient/Family education, Self Care, Joint mobilization, Stair training, DME instructions, Aquatic Therapy, Dry Needling, Electrical stimulation, Cryotherapy, Moist heat, Taping, Manual therapy, and Re-evaluation.   PLAN FOR NEXT SESSION:   work on patella mobility.  Continue straight leg raise; consider sit to stand transfers; consider nu-step and balance exercises; progress hip and knee exercises as tolerated.

## 2023-11-10 ENCOUNTER — Ambulatory Visit (HOSPITAL_BASED_OUTPATIENT_CLINIC_OR_DEPARTMENT_OTHER): Payer: Medicare Other | Admitting: Physical Therapy

## 2023-11-10 ENCOUNTER — Encounter (HOSPITAL_BASED_OUTPATIENT_CLINIC_OR_DEPARTMENT_OTHER): Payer: Self-pay | Admitting: Physical Therapy

## 2023-11-10 DIAGNOSIS — M25562 Pain in left knee: Secondary | ICD-10-CM | POA: Diagnosis not present

## 2023-11-10 DIAGNOSIS — R2689 Other abnormalities of gait and mobility: Secondary | ICD-10-CM | POA: Diagnosis not present

## 2023-11-10 DIAGNOSIS — G8929 Other chronic pain: Secondary | ICD-10-CM | POA: Diagnosis not present

## 2023-11-10 DIAGNOSIS — M25561 Pain in right knee: Secondary | ICD-10-CM | POA: Diagnosis not present

## 2023-11-10 NOTE — Therapy (Signed)
OUTPATIENT PHYSICAL THERAPY LOWER EXTREMITY TREATMENT   Patient Name: Nancy Vasquez MRN: [id number] DOB:09/18/1941, 82 y.o., female Today's Date: 11/10/2023  END OF SESSION:  PT End of Session - 11/10/23 1138     Visit Number 7    Number of Visits 16    Date for PT Re-Evaluation 11/25/23    Authorization Type BCBS    PT Start Time 1115    PT Stop Time 1155    PT Time Calculation (min) 40 min    Behavior During Therapy [facility] for tasks assessed/performed                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   Folliculitis 04/10/2023   Past Surgical History:  Procedure Laterality Date   ABDOMINAL HYSTERECTOMY     BREAST EXCISIONAL BIOPSY Left    CORONARY PRESSURE/FFR STUDY N/A 03/17/2017   Procedure: Intravascular Pressure Wire/FFR Study;  Surgeon: Rinaldo Cloud, MD;  Location: [facility] INVASIVE CV LAB;  Service: Cardiovascular;  Laterality: N/A;  mid LAD   LEFT HEART CATH AND CORONARY ANGIOGRAPHY N/A 03/17/2017   Procedure: Left Heart Cath and Coronary Angiography;  Surgeon: Rinaldo Cloud, MD;  Location: [facility] INVASIVE CV LAB;  Service: Cardiovascular;  Laterality: N/A;   TONSILLECTOMY     Removed as a child   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   Procedure: LEFT TOTAL KNEE ARTHROPLASTY;  Surgeon: Cammy Copa, MD;  Location: [facility] OR;  Service: Orthopedics;  Laterality: Left;   Patient Active Problem List   Diagnosis Date Noted   Sepsis (HCC) 07/09/2023   Acute encephalopathy 07/09/2023   Candida vaginitis 04/23/2023   Cellulitis 04/09/2023   TIA (transient ischemic attack) 03/17/2023   HLD (hyperlipidemia) 11/06/2022   Coronary artery disease 09/27/2022   Osteoarthritis of left knee 04/19/2020    PCP: Dr Hoy Register   REFERRING PROVIDER:  Dr Hoy Register  REFERRING DIAG:  Diagnosis  M17.12 (ICD-10-CM) - Primary osteoarthritis of left  knee    THERAPY DIAG:  Chronic pain of left knee  Other abnormalities of gait and mobility  Chronic pain of right knee  Rationale for Evaluation and Treatment: Rehabilitation  ONSET DATE:    SUBJECTIVE:   SUBJECTIVE STATEMENT: Pt reports that she had traumatic experience in water as a child, but she is interested in trying aquatic therapy.  No new changes since last land appt.    EVAL: Patient had a knee replacement 3 years prior on her left knee.  The knee was never completely pain-free.  She has had continuous pain since that point with activity and when she sits too long.  She had a fall at some point on the knee to which increase the pain.  She was in the hospital for sepsis on 07/09/2023.  Since that point she felt like she has decreased endurance with activity.  She has difficulty transferring from sit to stand.  She has also had a another increase in knee pain.  PERTINENT HISTORY: Right hip replacement, TIA patient reports she has had no residual side effects, left total knee replacement PAIN:  Are you having pain? Yes: NPRS scale: 5/10 Pain location: Left knee into lower shin Pain description: Aching Aggravating factors: Standing, walking, sitting Relieving factors: Rest  PRECAUTIONS: None  RED FLAGS: None   WEIGHT BEARING RESTRICTIONS: No  FALLS:  Has patient fallen in last 6 months?  No  LIVING ENVIRONMENT: 2 sets of stairs inside the house.  OCCUPATION:  Retired   Presenter, broadcasting:  Building control surveyor      PLOF:   Independent with a cane since afall > 6 months ago   PATIENT GOALS:  Improve mobility/ decrease pain in both knees  NEXT MD VISIT:    OBJECTIVE:   DIAGNOSTIC FINDINGS:   PATIENT SURVEYS:  FOTO    COGNITION: Overall cognitive status: Within functional limits for tasks assessed     SENSATION: Burning down into the shin; Since her bout with sepsis her hands are numb   EDEMA:  Pocket of swelling in the inferior lateral knee     MUSCLE LENGTH:   POSTURE: No Significant postural limitations  PALPATION: Tenderness to palpation   LOWER EXTREMITY ROM:  Active ROM Right 10/8 Left 10/8  Hip flexion    Hip extension    Hip abduction    Hip adduction    Hip internal rotation    Hip external rotation    Knee flexion 98 107  Knee extension -1 0  Ankle dorsiflexion    Ankle plantarflexion    Ankle inversion    Ankle eversion     (Blank rows = not tested)  LOWER EXTREMITY MMT:  MMT Right eval Left eval  Hip flexion 13.6 17.0  Hip extension    Hip abduction 14.3 15.2  Hip adduction    Hip internal rotation    Hip external rotation    Knee flexion    Knee extension 22.0 17.0  Ankle dorsiflexion    Ankle plantarflexion    Ankle inversion    Ankle eversion     (Blank rows = not tested)   5x sit to stand test 29 seconds   GAIT: Uses a cane in the right hand; decreased hip flexion; bilateral lateral weight shifting.  TODAY'S TREATMENT:                                                                                                                              DATE:   11/10/23 Pt seen for aquatic therapy today.  Treatment took place in water 3.5-4.75 ft in depth at the [facility] pool. Temp of water was 91.  Pt entered/exited the pool via stairs in step-to pattern independently with bilat rail. THERAPIST IN WATER WITH PATIENT.  * UE on barbell: walking forward 1 lap, side stepping 1 lap, backwards gait 1 width * UE on wall:  3 way toe touch x 7 reps each LE; relaxed squat x 5;  heel raises x8; hip ext x 10 each LE;   Hip abdct/ addct x 10 * return to walking with UE on barbell: forward 1 lap, side stepping 1 lap    Pt requires the buoyancy and hydrostatic pressure of water for support, and to offload joints by unweighting joint load by at least 50 % in navel deep water and by at least 75-80% in chest  to neck deep water.  Viscosity of the water is needed for resistance of  strengthening. Water current perturbations provides challenge to standing balance requiring increased core activation.   11/07/23  -Quad sets x 20 with towel roll ea side -heel slides with usage of pillow case x 20 making sure to keep knee in line with hip -SAQ x 20 -SLR x 10 -Pt needed extended time to perform therex due to knee stiffness and initial discomfort performing exercises which improved with increased repetitions -Manual Patella mobs to bil knees with pt reports of it feeling good -PT also spoke with pt about standing up more evenly vs swaying legs to the side and pushing off to stand using cane making her asymmetrically weightbear. She performed as recommended and stated legs to feel much better.    10/27/23  Quad sets x10  Manual: patella mobilizations to Bil knees, STM to bil distal ITB,   SLR: L- x5, R- x10  Bridges: 2x10  10/22  Manual: trigger point release to left qud; PA glide/ AP glide to left knee for pain relief and extension.   Quad set 2x10    Halted 2nd to report of chest tightness.     10/15: Tib/fem mobilizations posterior glide  Patellar mobilizations STM to L distal quad and ITB PROM LAQ 5" x10ea 2# Seated HSC RTB 2x10ea Seated marching 2x10 2#  10/8: Tib/fem mobilizations posterior glide  Patellar mobilizations PROM SLR x10 ea LAQ 5" x15ea Seated HSC YTB x10ea Seated marching 2x10   PATIENT EDUCATION:  Education details: Intro to aquatic therapy Person educated: Patient Education method: Explanation, Demonstration, Tactile cues, Verbal cues,  Education comprehension: verbalized understanding, returned demonstration, verbal cues required, tactile cues required, and needs further education  HOME EXERCISE PROGRAM: Access Code: [email or web address] URL: [email or web address] Date: 08/20/2023 Prepared by: Lorayne Bender  Exercises - Long Sitting 4 Way Patellar Glide  - 1 x daily - 7 x weekly - 3 sets - 10 reps - Supine Quad Set   - 1 x daily - 7 x weekly - 3 sets - 10 reps - Seated March  - 1 x daily - 7 x weekly - 3 sets - 10 reps - Seated Hip Abduction with Resistance  - 1 x daily - 7 x weekly - 3 sets - 10 reps  ASSESSMENT:  CLINICAL IMPRESSION:  Pt requires therapist in water with her and CGA/sba due to unsteadiness with gait in water.  She requires cues for more even step length and to avoid hopping to LLE from RLE with forward gait/ side stepping.  Pain reduced to 1/10 while in the water.  Therapist to check FOTO next session, along with STGs.    EVAL: Patient is a 82 year old female who presents with left knee pain and decreased endurance following a hospitalization.  She has pain with terminal knee extension.  She also has limited mobility of her patella.  She has increased pain when she is standing and walking.  She also has increased pain when she is sits for too long.  She has increased difficulty standing from a sitting position.  She has a decrease 5 times sit to stand time.  She would benefit from skilled therapy to decrease left knee pain, increase endurance, and increase general functional mobility. OBJECTIVE IMPAIRMENTS: decreased activity tolerance, decreased ROM, and pain.    ACTIVITY LIMITATIONS: locomotion level   PARTICIPATION LIMITATIONS: driving and recreational activities, normal physical activites   PERSONAL FACTORS: Age, Time since onset of injury/illness/exacerbation, and 3+  comorbidities: HLD, TIA, OA, CAD  are also affecting patient's functional outcome.    REHAB POTENTIAL: Good  CLINICAL DECISION MAKING: Evolving/moderate complexity recent hospitalization has caused a decline in general mobility   EVALUATION COMPLEXITY: Moderate   GOALS: Goals reviewed with patient? Yes  SHORT TERM GOALS: Target date: 09/16/2023   Patient will decrease TUG time by 10 sec  Baseline: Goal status: INITIAL  2.  Patient will increase bilateral LE strength by 5 lbs  Baseline:  Goal status:  INITIAL  3.  Patient will demonstrate full PROM into extension with the left knee without pain   Baseline:  Goal status: INITIAL   LONG TERM GOALS: Target date: POC date     Patient will transfer sit to stand without difficulty patient will ambulate community distances without difficulty goal status: INITIAL  2.  patient will ambulate community distances without difficulty  Baseline:  Goal status: INITIAL  3.  Patient will stand for greater than 20 minutes without increased left knee pain in order to perform ADLs Baseline:  Goal status: INITIAL  4.  Patient will go up and down stairs without difficulty Baseline:  Goal status:In progress - 11/10/23     PLAN:  PT FREQUENCY: 1-2x/week  PT DURATION: 8 weeks can not start until October 2nd to   PLANNED INTERVENTIONS:  Therapeutic exercises, Therapeutic activity, Neuromuscular re-education, Balance training, Gait training, Patient/Family education, Self Care, Joint mobilization, Stair training, DME instructions, Aquatic Therapy, Dry Needling, Electrical stimulation, Cryotherapy, Moist heat, Taping, Manual therapy, and Re-evaluation.   PLAN FOR NEXT SESSION:   work on patella mobility.  Continue straight leg raise; consider sit to stand transfers; consider nu-step and balance exercises; progress hip and knee exercises as tolerated.   Mayer Camel, PTA 11/10/23 2:01 PM [facility] [address] [place], [place], [address] Phone: [phone]   Fax:  [phone]

## 2023-11-11 ENCOUNTER — Ambulatory Visit: Payer: Medicare Other

## 2023-11-11 ENCOUNTER — Encounter (HOSPITAL_BASED_OUTPATIENT_CLINIC_OR_DEPARTMENT_OTHER): Payer: Medicare Other

## 2023-11-13 ENCOUNTER — Ambulatory Visit (HOSPITAL_BASED_OUTPATIENT_CLINIC_OR_DEPARTMENT_OTHER): Payer: Medicare Other | Admitting: Physical Therapy

## 2023-11-13 ENCOUNTER — Encounter (HOSPITAL_BASED_OUTPATIENT_CLINIC_OR_DEPARTMENT_OTHER): Payer: Self-pay | Admitting: Physical Therapy

## 2023-11-13 DIAGNOSIS — R2689 Other abnormalities of gait and mobility: Secondary | ICD-10-CM | POA: Diagnosis not present

## 2023-11-13 DIAGNOSIS — G8929 Other chronic pain: Secondary | ICD-10-CM | POA: Diagnosis not present

## 2023-11-13 DIAGNOSIS — M25562 Pain in left knee: Secondary | ICD-10-CM | POA: Diagnosis not present

## 2023-11-13 DIAGNOSIS — M25561 Pain in right knee: Secondary | ICD-10-CM | POA: Diagnosis not present

## 2023-11-13 NOTE — Therapy (Signed)
OUTPATIENT PHYSICAL THERAPY LOWER EXTREMITY TREATMENT   Patient Name: Nancy Vasquez MRN: [id number] DOB:May 03, 1941, 82 y.o., female Today's Date: 11/13/2023  END OF SESSION:  PT End of Session - 11/13/23 0907     Visit Number 8    Number of Visits 16    Date for PT Re-Evaluation 11/25/23    Authorization Type [employer]    PT Start Time 0904    PT Stop Time 0945    PT Time Calculation (min) 41 min    Activity Tolerance Patient tolerated treatment well    Behavior During Therapy [facility] for tasks assessed/performed                 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   Folliculitis 04/10/2023   Past Surgical History:  Procedure Laterality Date   ABDOMINAL HYSTERECTOMY     BREAST EXCISIONAL BIOPSY Left    CORONARY PRESSURE/FFR STUDY N/A 03/17/2017   Procedure: Intravascular Pressure Wire/FFR Study;  Surgeon: Rinaldo Cloud, MD;  Location: [facility] INVASIVE CV LAB;  Service: Cardiovascular;  Laterality: N/A;  mid LAD   LEFT HEART CATH AND CORONARY ANGIOGRAPHY N/A 03/17/2017   Procedure: Left Heart Cath and Coronary Angiography;  Surgeon: Rinaldo Cloud, MD;  Location: [facility] INVASIVE CV LAB;  Service: Cardiovascular;  Laterality: N/A;   TONSILLECTOMY     Removed as a child   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   Procedure: LEFT TOTAL KNEE ARTHROPLASTY;  Surgeon: Cammy Copa, MD;  Location: [facility] OR;  Service: Orthopedics;  Laterality: Left;   Patient Active Problem List   Diagnosis Date Noted   Sepsis (HCC) 07/09/2023   Acute encephalopathy 07/09/2023   Candida vaginitis 04/23/2023   Cellulitis 04/09/2023   TIA (transient ischemic attack) 03/17/2023   HLD (hyperlipidemia) 11/06/2022   Coronary artery disease 09/27/2022   Osteoarthritis of left knee 04/19/2020    PCP: Dr Hoy Register   REFERRING PROVIDER:  Dr Hoy Register  REFERRING DIAG:   Diagnosis  M17.12 (ICD-10-CM) - Primary osteoarthritis of left knee    THERAPY DIAG:  Chronic pain of left knee  Other abnormalities of gait and mobility  Chronic pain of right knee  Rationale for Evaluation and Treatment: Rehabilitation  ONSET DATE:    SUBJECTIVE:   SUBJECTIVE STATEMENT: Pt reports increase in bilat knee pain after last session 10/10 "stayed in bed but once I got up I was much better.  Husband made me come back and I am glad I did"   EVAL: Patient had a knee replacement 3 years prior on her left knee.  The knee was never completely pain-free.  She has had continuous pain since that point with activity and when she sits too long.  She had a fall at some point on the knee to which increase the pain.  She was in the hospital for sepsis on 07/09/2023.  Since that point she felt like she has decreased endurance with activity.  She has difficulty transferring from sit to stand.  She has also had a another increase in knee pain.  PERTINENT HISTORY: Right hip replacement, TIA patient reports she has had no residual side effects, left total knee replacement PAIN:  Are you having pain? Yes: NPRS scale: 3 /10 Pain location: Left knee into lower shin Pain description: Aching Aggravating factors: Standing, walking, sitting Relieving factors: Rest  PRECAUTIONS: None  RED FLAGS:  None   WEIGHT BEARING RESTRICTIONS: No  FALLS:  Has patient fallen in last 6 months? No  LIVING ENVIRONMENT: 2 sets of stairs inside the house.  OCCUPATION:  Retired   Presenter, broadcasting:  Building control surveyor      PLOF:   Independent with a cane since afall > 6 months ago   PATIENT GOALS:  Improve mobility/ decrease pain in both knees  NEXT MD VISIT:    OBJECTIVE:   DIAGNOSTIC FINDINGS:   PATIENT SURVEYS:  FOTO 40 primary score 11/13/23: 71%  COGNITION: Overall cognitive status: Within functional limits for tasks assessed     SENSATION: Burning down into the shin; Since  her bout with sepsis her hands are numb   EDEMA:  Pocket of swelling in the inferior lateral knee    MUSCLE LENGTH:   POSTURE: No Significant postural limitations  PALPATION: Tenderness to palpation   LOWER EXTREMITY ROM:  Active ROM Right 10/8 Left 10/8  Hip flexion    Hip extension    Hip abduction    Hip adduction    Hip internal rotation    Hip external rotation    Knee flexion 98 107  Knee extension -1 0  Ankle dorsiflexion    Ankle plantarflexion    Ankle inversion    Ankle eversion     (Blank rows = not tested)  LOWER EXTREMITY MMT:  MMT Right eval Left eval  Hip flexion 13.6 17.0  Hip extension    Hip abduction 14.3 15.2  Hip adduction    Hip internal rotation    Hip external rotation    Knee flexion    Knee extension 22.0 17.0  Ankle dorsiflexion    Ankle plantarflexion    Ankle inversion    Ankle eversion     (Blank rows = not tested)   5x sit to stand test 29 seconds   GAIT: Uses a cane in the right hand; decreased hip flexion; bilateral lateral weight shifting.  TODAY'S TREATMENT:                                                                                                                              DATE:   11/13/23 Pt seen for aquatic therapy today.  Treatment took place in water 3.5-4.75 ft in depth at the [facility] pool. Temp of water was 91.  Pt entered/exited the pool via stairs in step-to pattern independently with bilat rail. THERAPIST IN WATER WITH PATIENT.  * UE on barbell: walking forward 4 lap, side stepping 4 lap, backwards gait 2 widths.  VC for heel/toe strike and symmetrical weight bearing. *standing rest periods leaning on wall between widths as needed. * UE on wall:  heel raises x8; high knee marching * seated on lift: LAQ x 12; hip add/abd x 15; flutter kicking x 15. Cues for slowed pace.   Pt requires the buoyancy and hydrostatic pressure of water for support, and to offload joints by unweighting  joint  load by at least 50 % in navel deep water and by at least 75-80% in chest to neck deep water.  Viscosity of the water is needed for resistance of strengthening. Water current perturbations provides challenge to standing balance requiring increased core activation.   11/10/23 Pt seen for aquatic therapy today.  Treatment took place in water 3.5-4.75 ft in depth at the [facility] pool. Temp of water was 91.  Pt entered/exited the pool via stairs in step-to pattern independently with bilat rail. THERAPIST IN WATER WITH PATIENT.  * UE on barbell: walking forward 1 lap, side stepping 1 lap, backwards gait 1 width * UE on wall:  3 way toe touch x 7 reps each LE; relaxed squat x 5;  heel raises x8; hip ext x 10 each LE;   Hip abdct/ addct x 10 * return to walking with UE on barbell: forward 1 lap, side stepping 1 lap    Pt requires the buoyancy and hydrostatic pressure of water for support, and to offload joints by unweighting joint load by at least 50 % in navel deep water and by at least 75-80% in chest to neck deep water.  Viscosity of the water is needed for resistance of strengthening. Water current perturbations provides challenge to standing balance requiring increased core activation.   11/07/23  -Quad sets x 20 with towel roll ea side -heel slides with usage of pillow case x 20 making sure to keep knee in line with hip -SAQ x 20 -SLR x 10 -Pt needed extended time to perform therex due to knee stiffness and initial discomfort performing exercises which improved with increased repetitions -Manual Patella mobs to bil knees with pt reports of it feeling good -PT also spoke with pt about standing up more evenly vs swaying legs to the side and pushing off to stand using cane making her asymmetrically weightbear. She performed as recommended and stated legs to feel much better.    10/27/23  Quad sets x10  Manual: patella mobilizations to Bil knees, STM to bil distal ITB,   SLR: L-  x5, R- x10  Bridges: 2x10  10/22  Manual: trigger point release to left qud; PA glide/ AP glide to left knee for pain relief and extension.   Quad set 2x10    Halted 2nd to report of chest tightness.     10/15: Tib/fem mobilizations posterior glide  Patellar mobilizations STM to L distal quad and ITB PROM LAQ 5" x10ea 2# Seated HSC RTB 2x10ea Seated marching 2x10 2#  10/8: Tib/fem mobilizations posterior glide  Patellar mobilizations PROM SLR x10 ea LAQ 5" x15ea Seated HSC YTB x10ea Seated marching 2x10   PATIENT EDUCATION:  Education details: Intro to aquatic therapy Person educated: Patient Education method: Explanation, Demonstration, Tactile cues, Verbal cues,  Education comprehension: verbalized understanding, returned demonstration, verbal cues required, tactile cues required, and needs further education  HOME EXERCISE PROGRAM: Access Code: [email or web address] URL: [email or web address] Date: 08/20/2023 Prepared by: Lorayne Bender  Exercises - Long Sitting 4 Way Patellar Glide  - 1 x daily - 7 x weekly - 3 sets - 10 reps - Supine Quad Set  - 1 x daily - 7 x weekly - 3 sets - 10 reps - Seated March  - 1 x daily - 7 x weekly - 3 sets - 10 reps - Seated Hip Abduction with Resistance  - 1 x daily - 7 x weekly - 3 sets - 10 reps  ASSESSMENT:  CLINICAL IMPRESSION: Some  increase in pain bilat knees reported after last session followed by improvement in movement and decreased pain following days.  She continues to need therapist in water although unsteadiness decreased.  Most challenging is side stepping today requiring min-cga forward and back cga-cl supervision.  Cues given for heel strike LLE with forward gait  as she tends to strike forefoot/toe.  She is able to correct with cuing. She tolerates session well with reduction in left knee pain to 2/10 and right 0/10.  Foto completed and pt has exceeded her goal. She is instructed not to lay in bed as pain initial  morning pain will decrease with movement.  She VU. Goals ongoing.     EVAL: Patient is a 82 year old female who presents with left knee pain and decreased endurance following a hospitalization.  She has pain with terminal knee extension.  She also has limited mobility of her patella.  She has increased pain when she is standing and walking.  She also has increased pain when she is sits for too long.  She has increased difficulty standing from a sitting position.  She has a decrease 5 times sit to stand time.  She would benefit from skilled therapy to decrease left knee pain, increase endurance, and increase general functional mobility. OBJECTIVE IMPAIRMENTS: decreased activity tolerance, decreased ROM, and pain.    ACTIVITY LIMITATIONS: locomotion level   PARTICIPATION LIMITATIONS: driving and recreational activities, normal physical activites   PERSONAL FACTORS: Age, Time since onset of injury/illness/exacerbation, and 3+ comorbidities: HLD, TIA, OA, CAD  are also affecting patient's functional outcome.    REHAB POTENTIAL: Good  CLINICAL DECISION MAKING: Evolving/moderate complexity recent hospitalization has caused a decline in general mobility   EVALUATION COMPLEXITY: Moderate   GOALS: Goals reviewed with patient? Yes  SHORT TERM GOALS: Target date: 09/16/2023   Patient will decrease TUG time by 10 sec  Baseline: Goal status: INITIAL  2.  Patient will increase bilateral LE strength by 5 lbs  Baseline:  Goal status: INITIAL  3.  Patient will demonstrate full PROM into extension with the left knee without pain   Baseline:  Goal status: INITIAL   LONG TERM GOALS: Target date: POC date     Patient will transfer sit to stand without difficulty patient will ambulate community distances without difficulty goal status: INITIAL  2.  patient will ambulate community distances without difficulty  Baseline:  Goal status: INITIAL  3.  Patient will stand for greater than 20  minutes without increased left knee pain in order to perform ADLs Baseline:  Goal status: INITIAL  4.  Patient will go up and down stairs without difficulty Baseline:  Goal status:In progress - 11/10/23     PLAN:  PT FREQUENCY: 1-2x/week  PT DURATION: 8 weeks can not start until October 2nd to   PLANNED INTERVENTIONS:  Therapeutic exercises, Therapeutic activity, Neuromuscular re-education, Balance training, Gait training, Patient/Family education, Self Care, Joint mobilization, Stair training, DME instructions, Aquatic Therapy, Dry Needling, Electrical stimulation, Cryotherapy, Moist heat, Taping, Manual therapy, and Re-evaluation.   PLAN FOR NEXT SESSION:   work on patella mobility.  Continue straight leg raise; consider sit to stand transfers; consider nu-step and balance exercises; progress hip and knee exercises as tolerated.   [address] [place]) Leeland Lovelady MPT 11/13/23 10:03 AM [facility] [address] [place], [place], [address] Phone: [phone]   Fax:  [phone]

## 2023-11-16 ENCOUNTER — Encounter (HOSPITAL_BASED_OUTPATIENT_CLINIC_OR_DEPARTMENT_OTHER): Payer: Self-pay | Admitting: Physical Therapy

## 2023-11-16 ENCOUNTER — Ambulatory Visit (HOSPITAL_BASED_OUTPATIENT_CLINIC_OR_DEPARTMENT_OTHER): Payer: Medicare Other | Admitting: Physical Therapy

## 2023-11-16 DIAGNOSIS — M25561 Pain in right knee: Secondary | ICD-10-CM | POA: Diagnosis not present

## 2023-11-16 DIAGNOSIS — G8929 Other chronic pain: Secondary | ICD-10-CM

## 2023-11-16 DIAGNOSIS — M25562 Pain in left knee: Secondary | ICD-10-CM | POA: Diagnosis not present

## 2023-11-16 DIAGNOSIS — R2689 Other abnormalities of gait and mobility: Secondary | ICD-10-CM

## 2023-11-16 NOTE — Therapy (Signed)
OUTPATIENT PHYSICAL THERAPY LOWER EXTREMITY TREATMENT   Patient Name: Nancy Vasquez MRN: [id number] DOB:1941-09-15, 82 y.o., female Today's Date: 11/16/2023  END OF SESSION:  PT End of Session - 11/16/23 1110     Visit Number 9    Number of Visits 16    Date for PT Re-Evaluation 11/25/23    Authorization Type BCBS    PT Start Time 1110    PT Stop Time 1148    PT Time Calculation (min) 38 min    Behavior During Therapy [facility] for tasks assessed/performed                 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   Folliculitis 04/10/2023   Past Surgical History:  Procedure Laterality Date   ABDOMINAL HYSTERECTOMY     BREAST EXCISIONAL BIOPSY Left    CORONARY PRESSURE/FFR STUDY N/A 03/17/2017   Procedure: Intravascular Pressure Wire/FFR Study;  Surgeon: Rinaldo Cloud, MD;  Location: [facility] INVASIVE CV LAB;  Service: Cardiovascular;  Laterality: N/A;  mid LAD   LEFT HEART CATH AND CORONARY ANGIOGRAPHY N/A 03/17/2017   Procedure: Left Heart Cath and Coronary Angiography;  Surgeon: Rinaldo Cloud, MD;  Location: [facility] INVASIVE CV LAB;  Service: Cardiovascular;  Laterality: N/A;   TONSILLECTOMY     Removed as a child   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   Procedure: LEFT TOTAL KNEE ARTHROPLASTY;  Surgeon: Cammy Copa, MD;  Location: [facility] OR;  Service: Orthopedics;  Laterality: Left;   Patient Active Problem List   Diagnosis Date Noted   Sepsis (HCC) 07/09/2023   Acute encephalopathy 07/09/2023   Candida vaginitis 04/23/2023   Cellulitis 04/09/2023   TIA (transient ischemic attack) 03/17/2023   HLD (hyperlipidemia) 11/06/2022   Coronary artery disease 09/27/2022   Osteoarthritis of left knee 04/19/2020    PCP: Dr Hoy Register   REFERRING PROVIDER:  Dr Hoy Register  REFERRING DIAG:  Diagnosis  M17.12 (ICD-10-CM) - Primary osteoarthritis of left  knee    THERAPY DIAG:  Chronic pain of left knee  Other abnormalities of gait and mobility  Chronic pain of right knee  Rationale for Evaluation and Treatment: Rehabilitation  ONSET DATE:    SUBJECTIVE:   SUBJECTIVE STATEMENT: Pt reports she still has soreness in bilat knees.  She reports that the burning in her LLE has improved and she is noticing improvement since starting therapy.     EVAL: Patient had a knee replacement 3 years prior on her left knee.  The knee was never completely pain-free.  She has had continuous pain since that point with activity and when she sits too long.  She had a fall at some point on the knee to which increase the pain.  She was in the hospital for sepsis on 07/09/2023.  Since that point she felt like she has decreased endurance with activity.  She has difficulty transferring from sit to stand.  She has also had a another increase in knee pain.  PERTINENT HISTORY: Right hip replacement, TIA patient reports she has had no residual side effects, left total knee replacement PAIN:  Are you having pain? Yes: NPRS scale: 3/10 Pain location: Left knee into lower shin, Rt knee  Pain description: Aching Aggravating factors: Standing, walking, sitting Relieving factors: Rest  PRECAUTIONS: None  RED FLAGS: None   WEIGHT BEARING RESTRICTIONS: No  FALLS:  Has patient  fallen in last 6 months? No  LIVING ENVIRONMENT: 2 sets of stairs inside the house.  OCCUPATION:  Retired   Presenter, broadcasting:  Building control surveyor      PLOF:   Independent with a cane since afall > 6 months ago   PATIENT GOALS:  Improve mobility/ decrease pain in both knees  NEXT MD VISIT:    OBJECTIVE:   DIAGNOSTIC FINDINGS:   PATIENT SURVEYS:  FOTO 71 primary score 11/13/23: 71%  COGNITION: Overall cognitive status: Within functional limits for tasks assessed     SENSATION: Burning down into the shin; Since her bout with sepsis her hands are numb   EDEMA:  Pocket  of swelling in the inferior lateral knee    MUSCLE LENGTH:   POSTURE: No Significant postural limitations  PALPATION: Tenderness to palpation   LOWER EXTREMITY ROM:  Active ROM Right 10/8 Left 10/8  Hip flexion    Hip extension    Hip abduction    Hip adduction    Hip internal rotation    Hip external rotation    Knee flexion 98 107  Knee extension -1 0  Ankle dorsiflexion    Ankle plantarflexion    Ankle inversion    Ankle eversion     (Blank rows = not tested)  LOWER EXTREMITY MMT:  MMT Right eval Left eval  Hip flexion 13.6 17.0  Hip extension    Hip abduction 14.3 15.2  Hip adduction    Hip internal rotation    Hip external rotation    Knee flexion    Knee extension 22.0 17.0  Ankle dorsiflexion    Ankle plantarflexion    Ankle inversion    Ankle eversion     (Blank rows = not tested)   5x sit to stand test 29 seconds  11/16/23:  TUG - 15.92s   GAIT: Uses a cane in the right hand; decreased hip flexion; bilateral lateral weight shifting.  TODAY'S TREATMENT:                                                                                                                              DATE:   11/16/23 TUG Pt seen for aquatic therapy today.  Treatment took place in water 3.5-4.75 ft in depth at the [facility] pool. Temp of water was 91.  Pt entered/exited the pool via stairs independently with bilat rail.  THERAPIST near pt at all times, due to pt's fear in water.  * UE on wall:  side stepping 12 ft R/L x 3 reps * UE on wall and one hand on rainbow hand float: walking forward 12 ft x 6 reps; backwards x 3 reps * UE on wall:  heel raises x 10; hip abdct/ addct 3 x 5; high knee marching4 * seated in lift chair:  cycling, LAQ with DF/ PF , long sitting hip abdct/ addct * L forward step up/ R retro step down x 10 , UE on  rails * squats with UE on rails x 8 * R/L hamstring stretch with foot on 2nd step and hip hinge  20s (minimal stretch  felt)  Pt requires the buoyancy and hydrostatic pressure of water for support, and to offload joints by unweighting joint load by at least 50 % in navel deep water and by at least 75-80% in chest to neck deep water.  Viscosity of the water is needed for resistance of strengthening. Water current perturbations provides challenge to standing balance requiring increased core activation.   11/13/23 Pt seen for aquatic therapy today.  Treatment took place in water 3.5-4.75 ft in depth at the [facility] pool. Temp of water was 91.  Pt entered/exited the pool via stairs in step-to pattern independently with bilat rail. THERAPIST IN WATER WITH PATIENT.  * UE on barbell: walking forward 4 lap, side stepping 4 lap, backwards gait 2 widths.  VC for heel/toe strike and symmetrical weight bearing. *standing rest periods leaning on wall between widths as needed. * UE on wall:  heel raises x8; high knee marching * seated on lift: LAQ x 12; hip add/abd x 15; flutter kicking x 15. Cues for slowed pace.  Pt requires the buoyancy and hydrostatic pressure of water for support, and to offload joints by unweighting joint load by at least 50 % in navel deep water and by at least 75-80% in chest to neck deep water.  Viscosity of the water is needed for resistance of strengthening. Water current perturbations provides challenge to standing balance requiring increased core activation.   11/10/23 Pt seen for aquatic therapy today.  Treatment took place in water 3.5-4.75 ft in depth at the [facility] pool. Temp of water was 91.  Pt entered/exited the pool via stairs in step-to pattern independently with bilat rail. THERAPIST IN WATER WITH PATIENT.  * UE on barbell: walking forward 1 lap, side stepping 1 lap, backwards gait 1 width * UE on wall:  3 way toe touch x 7 reps each LE; relaxed squat x 5;  heel raises x8; hip ext x 10 each LE;   Hip abdct/ addct x 10 * return to walking with UE on barbell:  forward 1 lap, side stepping 1 lap    Pt requires the buoyancy and hydrostatic pressure of water for support, and to offload joints by unweighting joint load by at least 50 % in navel deep water and by at least 75-80% in chest to neck deep water.  Viscosity of the water is needed for resistance of strengthening. Water current perturbations provides challenge to standing balance requiring increased core activation.   11/07/23  -Quad sets x 20 with towel roll ea side -heel slides with usage of pillow case x 20 making sure to keep knee in line with hip -SAQ x 20 -SLR x 10 -Pt needed extended time to perform therex due to knee stiffness and initial discomfort performing exercises which improved with increased repetitions -Manual Patella mobs to bil knees with pt reports of it feeling good -PT also spoke with pt about standing up more evenly vs swaying legs to the side and pushing off to stand using cane making her asymmetrically weightbear. She performed as recommended and stated legs to feel much better.    10/27/23  Quad sets x10  Manual: patella mobilizations to Bil knees, STM to bil distal ITB,   SLR: L- x5, R- x10  Bridges: 2x10   PATIENT EDUCATION:  Education details: Intro to aquatic therapy Person educated:  Patient Education method: Explanation, Demonstration, Tactile cues, Verbal cues,  Education comprehension: verbalized understanding, returned demonstration, verbal cues required, tactile cues required, and needs further education  HOME EXERCISE PROGRAM: Access Code: [email or web address] URL: [email or web address] Date: 08/20/2023 Prepared by: Lorayne Bender  Exercises - Long Sitting 4 Way Patellar Glide  - 1 x daily - 7 x weekly - 3 sets - 10 reps - Supine Quad Set  - 1 x daily - 7 x weekly - 3 sets - 10 reps - Seated March  - 1 x daily - 7 x weekly - 3 sets - 10 reps - Seated Hip Abduction with Resistance  - 1 x daily - 7 x weekly - 3 sets - 10  reps  ASSESSMENT:  CLINICAL IMPRESSION: Pt able to complete session with therapist on deck, however due to her fear in water, therapist always arms distance away and pt had UE support on wall or rails.  She reported some reduction in pain in knees while submerged 70%.  Forward step ups with LLE were challenging, as she reported increased discomfort with WB through LLE.   Will try heel lift in shoe prior to entry in water next session, due to reported leg length difference (RLE longer than LLE).  10th visit progress note next visit.   EVAL: Patient is a 82 year old female who presents with left knee pain and decreased endurance following a hospitalization.  She has pain with terminal knee extension.  She also has limited mobility of her patella.  She has increased pain when she is standing and walking.  She also has increased pain when she is sits for too long.  She has increased difficulty standing from a sitting position.  She has a decrease 5 times sit to stand time.  She would benefit from skilled therapy to decrease left knee pain, increase endurance, and increase general functional mobility.  OBJECTIVE IMPAIRMENTS: decreased activity tolerance, decreased ROM, and pain.    ACTIVITY LIMITATIONS: locomotion level   PARTICIPATION LIMITATIONS: driving and recreational activities, normal physical activites   PERSONAL FACTORS: Age, Time since onset of injury/illness/exacerbation, and 3+ comorbidities: HLD, TIA, OA, CAD  are also affecting patient's functional outcome.    REHAB POTENTIAL: Good  CLINICAL DECISION MAKING: Evolving/moderate complexity recent hospitalization has caused a decline in general mobility   EVALUATION COMPLEXITY: Moderate   GOALS: Goals reviewed with patient? Yes  SHORT TERM GOALS: Target date: 09/16/2023   Patient will decrease TUG time by 10 sec  Baseline: Goal status: Deferred - 11/16/23  2.  Patient will increase bilateral LE strength by 5 lbs  Baseline:   Goal status: INITIAL  3.  Patient will demonstrate full PROM into extension with the left knee without pain   Baseline:  Goal status: INITIAL   LONG TERM GOALS: Target date: POC date     Patient will transfer sit to stand without difficulty patient will ambulate community distances without difficulty goal status: INITIAL  2.  patient will ambulate community distances without difficulty  Baseline:  Goal status: INITIAL  3.  Patient will stand for greater than 20 minutes without increased left knee pain in order to perform ADLs Baseline:  Goal status: INITIAL  4.  Patient will go up and down stairs without difficulty Baseline:  Goal status:In progress - 11/10/23     PLAN:  PT FREQUENCY: 1-2x/week  PT DURATION: 8 weeks   PLANNED INTERVENTIONS:  Therapeutic exercises, Therapeutic activity, Neuromuscular re-education, Balance training, Gait training, Patient/Family education, Self Care, Joint mobilization,  Stair training, DME instructions, Aquatic Therapy, Dry Needling, Electrical stimulation, Cryotherapy, Moist heat, Taping, Manual therapy, and Re-evaluation.   PLAN FOR NEXT SESSION:   work on patella mobility.  Continue straight leg raise; consider sit to stand transfers; consider nu-step and balance exercises; progress hip and knee exercises as tolerated.    Mayer Camel, PTA 11/16/23 12:11 PM [facility] [address] [place], [place], [address] Phone: [phone]   Fax:  [phone]

## 2023-11-17 ENCOUNTER — Ambulatory Visit: Payer: Medicare Other | Attending: Family Medicine | Admitting: Family Medicine

## 2023-11-17 ENCOUNTER — Encounter: Payer: Self-pay | Admitting: Family Medicine

## 2023-11-17 VITALS — BP 145/65 | HR 54 | Ht 64.0 in | Wt 153.8 lb

## 2023-11-17 DIAGNOSIS — M1712 Unilateral primary osteoarthritis, left knee: Secondary | ICD-10-CM | POA: Diagnosis not present

## 2023-11-17 DIAGNOSIS — R030 Elevated blood-pressure reading, without diagnosis of hypertension: Secondary | ICD-10-CM | POA: Diagnosis not present

## 2023-11-17 DIAGNOSIS — H6123 Impacted cerumen, bilateral: Secondary | ICD-10-CM

## 2023-11-17 DIAGNOSIS — R591 Generalized enlarged lymph nodes: Secondary | ICD-10-CM

## 2023-11-17 DIAGNOSIS — K5909 Other constipation: Secondary | ICD-10-CM

## 2023-11-17 DIAGNOSIS — Z23 Encounter for immunization: Secondary | ICD-10-CM

## 2023-11-17 NOTE — Progress Notes (Signed)
Subjective:  Patient ID: Nancy Vasquez, female    DOB: 02/26/1941  Age: 82 y.o. MRN: [id number]  CC: Medical Management of Chronic Issues (Constipation/Right ear pain/Knots on chin)   HPI Nancy Vasquez is a 82 y.o. year old female with a history of CAD (followed by Dr Sharyn Lull), TIA , left knee osteoarthritis (s/p left TKR),  right THR, hyperlipidemia, hypertension .   Interval History: Discussed the use of AI scribe software for clinical note transcription with the patient, who gave verbal consent to proceed.  The chief complaint is severe knee pain, which has been exacerbated by recent water therapy sessions. The patient reports that the pain is so severe that it has led to difficulty walking and has resulted in the cancellation of her next session.  In addition to the knee pain, the patient reports an earache, describing it as a sensation of something being in the left ear. The patient also mentions nodules on the left side of the throat, which sometimes feel enlarged.  The patient also reports constipation, describing the stool as hard and ball-like. The patient has been manually disimpacting but is concerned about the ongoing issue.  Lastly, the patient expresses concern about ridges in the fingernails, which she read could be indicative of a lymphoma.        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   Folliculitis 04/10/2023    Past Surgical History:  Procedure Laterality Date   ABDOMINAL HYSTERECTOMY     BREAST EXCISIONAL BIOPSY Left    CORONARY PRESSURE/FFR STUDY N/A 03/17/2017   Procedure: Intravascular Pressure Wire/FFR Study;  Surgeon: Rinaldo Cloud, MD;  Location: [facility] INVASIVE CV LAB;  Service: Cardiovascular;  Laterality: N/A;  mid LAD   LEFT HEART CATH AND CORONARY ANGIOGRAPHY N/A 03/17/2017   Procedure: Left Heart Cath and Coronary Angiography;   Surgeon: Rinaldo Cloud, MD;  Location: [facility] INVASIVE CV LAB;  Service: Cardiovascular;  Laterality: N/A;   TONSILLECTOMY     Removed as a child   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   Procedure: LEFT TOTAL KNEE ARTHROPLASTY;  Surgeon: Cammy Copa, MD;  Location: [facility] OR;  Service: Orthopedics;  Laterality: Left;    Family History  Problem Relation Age of Onset   Breast cancer Sister 36       metastatic breast ca    Social History   Socioeconomic History   Marital status: Married    Spouse name: Not on file   Number of children: Not on file   Years of education: Not on file   Highest education level: Not on file  Occupational History   Not on file  Tobacco Use   Smoking status: Never   Smokeless tobacco: Never  Vaping Use   Vaping status: Never Used  Substance and Sexual Activity   Alcohol use: Never   Drug use: Never   Sexual activity: Yes  Other Topics Concern   Not on file  Social History Narrative   Not on file   Social Determinants of Health   Financial Resource Strain: Low Risk  (07/22/2023)   Overall Financial Resource Strain (CARDIA)    Difficulty of Paying Living Expenses: Not hard at all  Food Insecurity: No Food Insecurity (07/22/2023)   Hunger Vital Sign    Worried About Running Out of Food in the Last Year: Never true    Ran  Out of Food in the Last Year: Never true  Transportation Needs: No Transportation Needs (07/22/2023)   PRAPARE - Administrator, Civil Service (Medical): No    Lack of Transportation (Non-Medical): No  Physical Activity: Insufficiently Active (07/22/2023)   Exercise Vital Sign    Days of Exercise per Week: 3 days    Minutes of Exercise per Session: 30 min  Stress: No Stress Concern Present (07/22/2023)   [employer] of Occupational Health - Occupational Stress Questionnaire    Feeling of Stress : Not at all  Social Connections: Socially Integrated (07/22/2023)   Social Connection and Isolation Panel [NHANES]     Frequency of Communication with Friends and Family: More than three times a week    Frequency of Social Gatherings with Friends and Family: Three times a week    Attends Religious Services: More than 4 times per year    Active Member of Clubs or Organizations: Yes    Attends Banker Meetings: 1 to 4 times per year    Marital Status: Married    Allergies  Allergen Reactions   Ibuprofen Nausea And Vomiting and Other (See Comments)    "burns stomach"   Oxycodone Nausea Only    "Burns stomach"    Outpatient Medications Prior to Visit  Medication Sig Dispense Refill   atorvastatin (LIPITOR) 20 MG tablet Take 1 tablet (20 mg total) by mouth daily. 90 tablet 3   clopidogrel (PLAVIX) 75 MG tablet Take 1 tablet (75 mg total) by mouth daily. 90 tablet 1   diclofenac Sodium (VOLTAREN ARTHRITIS PAIN) 1 % GEL Apply 4 g topically 4 (four) times daily. 200 g 1   nitroGLYCERIN (NITROSTAT) 0.4 MG SL tablet Place under the tongue at the first sign of attack. Repeat every 5 minutes up to 3 tabs if no relief see medical help. 25 tablet 3   Vitamin D, Ergocalciferol, (DRISDOL) 1.25 MG (50000 UNIT) CAPS capsule Take 1 capsule (50,000 Units total) by mouth once a week. 12 capsule 1   ergocalciferol (VITAMIN D2) 1.25 MG (50000 UT) capsule Take 1 capsule (50,000 Units total) by mouth once a week. (Patient not taking: Reported on 08/28/2023) 12 capsule 3   No facility-administered medications prior to visit.     ROS Review of Systems  Constitutional:  Negative for activity change and appetite change.  HENT:  Positive for ear pain. Negative for sinus pressure and sore throat.   Respiratory:  Negative for chest tightness, shortness of breath and wheezing.   Cardiovascular:  Negative for chest pain and palpitations.  Gastrointestinal:  Positive for constipation. Negative for abdominal distention and abdominal pain.  Genitourinary: Negative.   Musculoskeletal: Negative.    Psychiatric/Behavioral:  Negative for behavioral problems and dysphoric mood.     Objective:  BP (!) 145/65   Pulse (!) 54   Ht 5\' 4"  (1.626 m)   Wt 153 lb 12.8 oz (69.8 kg)   SpO2 100%   BMI 26.40 kg/m      11/17/2023    4:13 PM 11/17/2023    3:45 PM 08/28/2023    2:08 PM  BP/Weight  Systolic BP 145 155 115  Diastolic BP 65 74 67  Wt. (Lbs)  153.8 157.8  BMI  26.4 kg/m2 27.09 kg/m2      Physical Exam Constitutional:      Appearance: She is well-developed.  HENT:     Right Ear: There is impacted cerumen.     Left Ear: There is  impacted cerumen.  Cardiovascular:     Rate and Rhythm: Bradycardia present.     Heart sounds: Normal heart sounds. No murmur heard. Pulmonary:     Effort: Pulmonary effort is normal.     Breath sounds: Normal breath sounds. No wheezing or rales.  Chest:     Chest wall: No tenderness.  Abdominal:     General: Bowel sounds are normal. There is no distension.     Palpations: Abdomen is soft. There is no mass.     Tenderness: There is no abdominal tenderness.  Musculoskeletal:     Right knee: Normal range of motion. No tenderness.     Left knee: Decreased range of motion. Tenderness present.     Right lower leg: No edema.     Left lower leg: No edema.  Lymphadenopathy:     Cervical: Cervical adenopathy (L anterior cervical) present.  Neurological:     Mental Status: She is alert and oriented to person, place, and time.  Psychiatric:        Mood and Affect: Mood normal.        Latest Ref Rng & Units 07/14/2023    3:22 PM 07/11/2023    6:21 AM 07/10/2023    7:17 AM  CMP  Glucose 70 - 99 mg/dL 84  89  841   BUN 8 - 27 mg/dL 8  7  8    Creatinine 0.57 - 1.00 mg/dL 6.60  6.30  1.60   Sodium 134 - 144 mmol/L 143  137  136   Potassium 3.5 - 5.2 mmol/L 3.8  3.7  3.7   Chloride 96 - 106 mmol/L 106  104  104   CO2 20 - 29 mmol/L 24  23  24    Calcium 8.7 - 10.3 mg/dL 9.6  8.7  8.8   Total Protein 6.0 - 8.5 g/dL 7.1   6.5   Total Bilirubin  0.0 - 1.2 mg/dL 0.4   1.0   Alkaline Phos 44 - 121 IU/L 68   50   AST 0 - 40 IU/L 29   19   ALT 0 - 32 IU/L 26   14     Lipid Panel     Component Value Date/Time   CHOL 152 09/28/2022 0525   TRIG 40 09/28/2022 0525   HDL 68 09/28/2022 0525   CHOLHDL 2.2 09/28/2022 0525   VLDL 8 09/28/2022 0525   LDLCALC 76 09/28/2022 0525    CBC    Component Value Date/Time   WBC 7.8 07/14/2023 1522   WBC 9.5 07/11/2023 0621   RBC 3.70 (L) 07/14/2023 1522   RBC 3.54 (L) 07/11/2023 0621   HGB 11.0 (L) 07/14/2023 1522   HCT 34.3 07/14/2023 1522   PLT 375 07/14/2023 1522   MCV 93 07/14/2023 1522   MCH 29.7 07/14/2023 1522   MCH 30.5 07/11/2023 0621   MCHC 32.1 07/14/2023 1522   MCHC 32.4 07/11/2023 0621   RDW 12.1 07/14/2023 1522   LYMPHSABS 2.4 07/14/2023 1522   MONOABS 1.3 (H) 07/09/2023 1653   EOSABS 0.1 07/14/2023 1522   BASOSABS 0.0 07/14/2023 1522    Lab Results  Component Value Date   HGBA1C 4.7 (L) 09/28/2022    Assessment & Plan:      Knee Pain History of left TKR Severe pain following aquatic physical therapy for osteoarthritis. Pain likely contributing to elevated blood pressure. -Continue Voltaren gel as needed for pain. -Advise patient to communicate with physical therapists about pain levels and  adjust therapy as needed.  Ear Discomfort/cerumen impaction Sensation of something in the ear, likely due to wax accumulation. -Ear Irrigation performed today  Constipation Reports hard bowel movements and self-disimpaction. -Advise daily use of Miralax and increase in dietary fiber. -Consider Activia yogurt for probiotics. -If symptoms persist will initiate either Amitiza or Linzess depending on insurance coverage  Lymphadenopathy Patient reports palpable nodules on left side of neck, likely lymph nodes. Nodes palpated in office and found to be of normal size. -Order blood work to rule out infectious process. -Reassured about reducing nail but also informed that  there are no clinical signs of lymphoma but we will order blood work  Elevated blood pressure without diagnosis of hypertension Elevated blood pressure likely secondary to pain. -Advise low sodium diet. -Recheck blood pressure in 3 months.  General Health Maintenance -Administer influenza vaccine today.          No orders of the defined types were placed in this encounter.   Follow-up: Return in about 3 months (around 02/17/2024) for Blood Pressure follow-up with PCP.       Hoy Register, MD, FAAFP. [facility] [place], [place] [phone]   11/17/2023, 5:11 PM

## 2023-11-17 NOTE — Patient Instructions (Signed)
VISIT SUMMARY:  During today's visit, we addressed several of your health concerns, including severe knee pain, ear discomfort, constipation, throat nodules, and elevated blood pressure. We also discussed general health maintenance and administered your influenza vaccine.  YOUR PLAN:  -KNEE PAIN: Your knee pain, which has worsened after aquatic physical therapy, is likely due to osteoarthritis. We recommend continuing to use Voltaren gel as needed for pain relief. Please communicate with your physical therapists about your pain levels so they can adjust your therapy accordingly.  -EAR DISCOMFORT: The sensation of something in your ear is likely due to wax accumulation. We cleaned out your ears today in the office.  -CONSTIPATION: Your constipation, characterized by hard and ball-like stools, can be managed by using Miralax daily and increasing your dietary fiber intake. You may also consider adding Activia yogurt to your diet for probiotics.  -LYMPHADENOPATHY: The nodules you feel on the left side of your neck are likely lymph nodes. They were palpated in the office and found to be of normal size. We have ordered blood work to rule out any infectious process.  -HYPERTENSION: Your elevated blood pressure is likely secondary to your pain. We recommend a low sodium diet and will recheck your blood pressure in 3 months.  -GENERAL HEALTH MAINTENANCE: We reviewed your routine labs and administered your influenza vaccine today. Please continue to follow a healthy lifestyle, including a balanced diet and regular exercise.  INSTRUCTIONS:  Please follow up in 3 months to recheck your blood pressure. Additionally, we will review the results of your blood work at your next visit.

## 2023-11-18 ENCOUNTER — Encounter (HOSPITAL_BASED_OUTPATIENT_CLINIC_OR_DEPARTMENT_OTHER): Payer: Medicare Other | Admitting: Physical Therapy

## 2023-11-18 LAB — CBC WITH DIFFERENTIAL/PLATELET
Basophils Absolute: 0 10*3/uL (ref 0.0–0.2)
Basos: 1 %
EOS (ABSOLUTE): 0.2 10*3/uL (ref 0.0–0.4)
Eos: 3 %
Hematocrit: 39.5 % (ref 34.0–46.6)
Hemoglobin: 12.5 g/dL (ref 11.1–15.9)
Immature Grans (Abs): 0 10*3/uL (ref 0.0–0.1)
Immature Granulocytes: 0 %
Lymphocytes Absolute: 2.2 10*3/uL (ref 0.7–3.1)
Lymphs: 35 %
MCH: 30 pg (ref 26.6–33.0)
MCHC: 31.6 g/dL (ref 31.5–35.7)
MCV: 95 fL (ref 79–97)
Monocytes Absolute: 0.6 10*3/uL (ref 0.1–0.9)
Monocytes: 10 %
Neutrophils Absolute: 3.2 10*3/uL (ref 1.4–7.0)
Neutrophils: 51 %
Platelets: 300 10*3/uL (ref 150–450)
RBC: 4.17 x10E6/uL (ref 3.77–5.28)
RDW: 11.8 % (ref 11.7–15.4)
WBC: 6.2 10*3/uL (ref 3.4–10.8)

## 2023-11-18 LAB — BASIC METABOLIC PANEL
BUN/Creatinine Ratio: 10 — ABNORMAL LOW (ref 12–28)
BUN: 8 mg/dL (ref 8–27)
CO2: 23 mmol/L (ref 20–29)
Calcium: 10 mg/dL (ref 8.7–10.3)
Chloride: 105 mmol/L (ref 96–106)
Creatinine, Ser: 0.79 mg/dL (ref 0.57–1.00)
Glucose: 91 mg/dL (ref 70–99)
Potassium: 4.1 mmol/L (ref 3.5–5.2)
Sodium: 143 mmol/L (ref 134–144)
eGFR: 76 mL/min/{1.73_m2} (ref >59)

## 2023-11-19 ENCOUNTER — Ambulatory Visit (HOSPITAL_BASED_OUTPATIENT_CLINIC_OR_DEPARTMENT_OTHER): Payer: Medicare Other | Admitting: Physical Therapy

## 2023-11-21 ENCOUNTER — Encounter (HOSPITAL_BASED_OUTPATIENT_CLINIC_OR_DEPARTMENT_OTHER): Payer: Medicare Other | Admitting: Rehabilitative and Restorative Service Providers"

## 2023-11-24 ENCOUNTER — Ambulatory Visit (HOSPITAL_BASED_OUTPATIENT_CLINIC_OR_DEPARTMENT_OTHER): Payer: Medicare Other | Admitting: Physical Therapy

## 2023-11-27 ENCOUNTER — Other Ambulatory Visit: Payer: Self-pay

## 2023-11-30 ENCOUNTER — Telehealth: Payer: Self-pay

## 2023-11-30 NOTE — Telephone Encounter (Signed)
Call placed to patient and VM was left. Patient has also been sent a [employer].

## 2023-11-30 NOTE — Telephone Encounter (Signed)
Copied from CRM [id number]. Topic: General - Inquiry >> Nov 30, 2023 11:29 AM Clide Dales wrote: Patient would like to speak with provider about being sent for a nerve block in her left knee. Please advise.

## 2023-12-01 ENCOUNTER — Encounter (HOSPITAL_BASED_OUTPATIENT_CLINIC_OR_DEPARTMENT_OTHER): Payer: Self-pay | Admitting: Physical Therapy

## 2023-12-01 ENCOUNTER — Ambulatory Visit (HOSPITAL_BASED_OUTPATIENT_CLINIC_OR_DEPARTMENT_OTHER): Payer: Medicare Other | Attending: Family Medicine | Admitting: Physical Therapy

## 2023-12-01 ENCOUNTER — Encounter: Payer: Self-pay | Admitting: Family Medicine

## 2023-12-01 ENCOUNTER — Telehealth (HOSPITAL_BASED_OUTPATIENT_CLINIC_OR_DEPARTMENT_OTHER): Payer: Medicare Other | Admitting: Family Medicine

## 2023-12-01 DIAGNOSIS — M25561 Pain in right knee: Secondary | ICD-10-CM | POA: Diagnosis not present

## 2023-12-01 DIAGNOSIS — M1712 Unilateral primary osteoarthritis, left knee: Secondary | ICD-10-CM | POA: Diagnosis not present

## 2023-12-01 DIAGNOSIS — M25562 Pain in left knee: Secondary | ICD-10-CM | POA: Diagnosis not present

## 2023-12-01 DIAGNOSIS — G8929 Other chronic pain: Secondary | ICD-10-CM | POA: Insufficient documentation

## 2023-12-01 DIAGNOSIS — R2689 Other abnormalities of gait and mobility: Secondary | ICD-10-CM | POA: Insufficient documentation

## 2023-12-01 NOTE — Therapy (Signed)
OUTPATIENT PHYSICAL THERAPY LOWER EXTREMITY TREATMENT Progress Note/ re-cert Reporting Period 05/28/23 to 12/01/23  See note below for Objective Data and Assessment of Progress/Goals.      Patient Name: Nancy Vasquez MRN: [id number] DOB:1941/11/06, 82 y.o., female Today's Date: 12/01/2023  END OF SESSION:  PT End of Session - 12/01/23 1425     Visit Number 10    Number of Visits 26    Date for PT Re-Evaluation 02/12/24    Authorization Type BC[employer]    Progress Note Due on Visit 20    PT Start Time 1430    PT Stop Time 1515    PT Time Calculation (min) 45 min    Activity Tolerance Patient tolerated treatment well    Behavior During Therapy [facility] for tasks assessed/performed                  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   Folliculitis 04/10/2023   Past Surgical History:  Procedure Laterality Date   ABDOMINAL HYSTERECTOMY     BREAST EXCISIONAL BIOPSY Left    CORONARY PRESSURE/FFR STUDY N/A 03/17/2017   Procedure: Intravascular Pressure Wire/FFR Study;  Surgeon: Rinaldo Cloud, MD;  Location: [facility] INVASIVE CV LAB;  Service: Cardiovascular;  Laterality: N/A;  mid LAD   LEFT HEART CATH AND CORONARY ANGIOGRAPHY N/A 03/17/2017   Procedure: Left Heart Cath and Coronary Angiography;  Surgeon: Rinaldo Cloud, MD;  Location: [facility] INVASIVE CV LAB;  Service: Cardiovascular;  Laterality: N/A;   TONSILLECTOMY     Removed as a child   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   Procedure: LEFT TOTAL KNEE ARTHROPLASTY;  Surgeon: Cammy Copa, MD;  Location: [facility] OR;  Service: Orthopedics;  Laterality: Left;   Patient Active Problem List   Diagnosis Date Noted   Sepsis (HCC) 07/09/2023   Acute encephalopathy 07/09/2023   Candida vaginitis 04/23/2023   Cellulitis 04/09/2023   TIA (transient ischemic attack) 03/17/2023   HLD (hyperlipidemia) 11/06/2022    Coronary artery disease 09/27/2022   Osteoarthritis of left knee 04/19/2020    PCP: Dr Hoy Register   REFERRING PROVIDER:  Dr Hoy Register  REFERRING DIAG:  Diagnosis  M17.12 (ICD-10-CM) - Primary osteoarthritis of left knee    THERAPY DIAG:  Chronic pain of left knee  Other abnormalities of gait and mobility  Chronic pain of right knee  Rationale for Evaluation and Treatment: Rehabilitation  ONSET DATE:    SUBJECTIVE:   SUBJECTIVE STATEMENT: Pt reports increase in pain after last session "almost wasn't able to get to the parking lot, she didn't hurt me I thought I felt good." My left knee has been painful up to 8/10"   Pt reports she still has soreness in bilat knees.  She reports that the burning in her LLE has improved and she is noticing improvement since starting therapy.     EVAL: Patient had a knee replacement 3 years prior on her left knee.  The knee was never completely pain-free.  She has had continuous pain since that point with activity and when she sits too long.  She had a fall at some point on the knee to which increase the pain.  She was in the hospital for sepsis on 07/09/2023.  Since that point she felt like she has decreased endurance with activity.  She has difficulty transferring from sit to stand.  She  has also had a another increase in knee pain.  PERTINENT HISTORY: Right hip replacement, TIA patient reports she has had no residual side effects, left total knee replacement PAIN:  Are you having pain? Yes: NPRS scale: 4/10 Pain location: Left knee into lower shin, Rt knee  Pain description: Aching Aggravating factors: Standing, walking, sitting Relieving factors: Rest  PRECAUTIONS: None  RED FLAGS: None   WEIGHT BEARING RESTRICTIONS: No  FALLS:  Has patient fallen in last 6 months? No  LIVING ENVIRONMENT: 2 sets of stairs inside the house.  OCCUPATION:  Retired   Presenter, broadcasting:  Building control surveyor      PLOF:   Independent with  a cane since afall > 6 months ago   PATIENT GOALS:  Improve mobility/ decrease pain in both knees  NEXT MD VISIT:    OBJECTIVE:   DIAGNOSTIC FINDINGS:   PATIENT SURVEYS:  FOTO 21 primary score 11/13/23: 71%  COGNITION: Overall cognitive status: Within functional limits for tasks assessed     SENSATION: Burning down into the shin; Since her bout with sepsis her hands are numb   EDEMA:  Pocket of swelling in the inferior lateral knee    MUSCLE LENGTH:   POSTURE: No Significant postural limitations  PALPATION: Tenderness to palpation   LOWER EXTREMITY ROM:  Active ROM Right 10/8 Left 10/8 Right / left 12/01/23  Hip flexion     Hip extension     Hip abduction     Hip adduction     Hip internal rotation     Hip external rotation     Knee flexion 98 107 128d /114  Knee extension -1 0 -2 /0  Ankle dorsiflexion     Ankle plantarflexion     Ankle inversion     Ankle eversion      (Blank rows = not tested)  LOWER EXTREMITY MMT:  MMT Right eval Left eval Right / Left 12/01/23  Hip flexion 13.6 17.0 25.4 / 28.8  Hip extension     Hip abduction 14.3 15.2 23.8 / 23.1  Hip adduction     Hip internal rotation     Hip external rotation     Knee flexion     Knee extension 22.0 17.0 30.8 / 37.6  Ankle dorsiflexion     Ankle plantarflexion     Ankle inversion     Ankle eversion      (Blank rows = not tested)   5x sit to stand test 29 seconds  11/16/23:  TUG - 15.92s 12/01/23: TUG - 27.0 Pt reports limitation due to pain      GAIT: Uses a cane in the right hand; decreased hip flexion; bilateral lateral weight shifting.  TODAY'S TREATMENT:                                                                                                                              DATE:   12/01/23 10th visit Progress note testing  11/16/23 TUG Pt seen for aquatic therapy today.  Treatment took place in water 3.5-4.75 ft in depth at the [facility] pool.  Temp of water was 91.  Pt entered/exited the pool via stairs independently with bilat rail.  THERAPIST near pt at all times, due to pt's fear in water.  * UE on wall:  side stepping 12 ft R/L x 3 reps * UE on wall and one hand on rainbow hand float: walking forward 12 ft x 6 reps; backwards x 3 reps * UE on wall:  heel raises x 10; hip abdct/ addct 3 x 5; high knee marching4 * seated in lift chair:  cycling, LAQ with DF/ PF , long sitting hip abdct/ addct * L forward step up/ R retro step down x 10 , UE on rails * squats with UE on rails x 8 * R/L hamstring stretch with foot on 2nd step and hip hinge  20s (minimal stretch felt)  Pt requires the buoyancy and hydrostatic pressure of water for support, and to offload joints by unweighting joint load by at least 50 % in navel deep water and by at least 75-80% in chest to neck deep water.  Viscosity of the water is needed for resistance of strengthening. Water current perturbations provides challenge to standing balance requiring increased core activation.   11/13/23 Pt seen for aquatic therapy today.  Treatment took place in water 3.5-4.75 ft in depth at the [facility] pool. Temp of water was 91.  Pt entered/exited the pool via stairs in step-to pattern independently with bilat rail. THERAPIST IN WATER WITH PATIENT.  *UE support wall standing: df; high knee marching x 10 * UE on barbell: walking forward 4 laps   *seate don water bench LAQ x 10  - STS ue support barbell x 5.  VC for weight shift and immediate standing balance erect posture; tactile cues/min assist and   *standing rest periods leaning on wall between widths as needed. * UE on wall:  heel raises x8; high knee marching * seated on lift: LAQ x 12; hip add/abd x 15; flutter kicking x 15. Cues for slowed pace.  Pt requires the buoyancy and hydrostatic pressure of water for support, and to offload joints by unweighting joint load by at least 50 % in navel deep water and by at  least 75-80% in chest to neck deep water.  Viscosity of the water is needed for resistance of strengthening. Water current perturbations provides challenge to standing balance requiring increased core activation.    PATIENT EDUCATION:  Education details: Intro to aquatic therapy Person educated: Patient Education method: Explanation, Demonstration, Tactile cues, Verbal cues,  Education comprehension: verbalized understanding, returned demonstration, verbal cues required, tactile cues required, and needs further education  HOME EXERCISE PROGRAM: Access Code: [email or web address] URL: [email or web address] Date: 08/20/2023 Prepared by: Lorayne Bender  Exercises - Long Sitting 4 Way Patellar Glide  - 1 x daily - 7 x weekly - 3 sets - 10 reps - Supine Quad Set  - 1 x daily - 7 x weekly - 3 sets - 10 reps - Seated March  - 1 x daily - 7 x weekly - 3 sets - 10 reps - Seated Hip Abduction with Resistance  - 1 x daily - 7 x weekly - 3 sets - 10 reps  ASSESSMENT:  CLINICAL IMPRESSION: PN: Pt complains of increasing left knee pain which is decreasing her mobility. She reports difficulty walking out of clinic last session when previously was not  difficulty. Today she presents in wc with husband pushing/assisting. She negotiates step into and out of pool with CGA. Test completed for re-cert demonstrates good progression of strength and left knee ROM but decreased functional testing TUG score (significantly). She has difficulty rising from chair due to pain predominantly in left knee. We did not yet address trial of lift in shoe but will land based in future. She has MD appt today after aquatic session to further address left knee. She will continue to benefit from skilled physical therapy intervention with plan to alternate land based and aquatics for maximal benefit progressing pt towards land based goals for improved mobility, amb, standing toleration and pain management.   OBJECTIVE IMPAIRMENTS:  decreased activity tolerance, decreased ROM, and pain.    ACTIVITY LIMITATIONS: locomotion level   PARTICIPATION LIMITATIONS: driving and recreational activities, normal physical activites   PERSONAL FACTORS: Age, Time since onset of injury/illness/exacerbation, and 3+ comorbidities: HLD, TIA, OA, CAD  are also affecting patient's functional outcome.    REHAB POTENTIAL: Good  CLINICAL DECISION MAKING: Evolving/moderate complexity recent hospitalization has caused a decline in general mobility   EVALUATION COMPLEXITY: Moderate   GOALS: Goals reviewed with patient? Yes  SHORT TERM GOALS: Target date: 09/16/2023   Patient will decrease TUG time by 10 sec  Baseline: Goal status: Deferred - 11/16/23  2.  Patient will increase bilateral LE strength by 5 lbs  Baseline:  Goal status: Met 12/01/23  3.  Patient will demonstrate full PROM into extension with the left knee without pain   Baseline:  Goal status: Met 12/01/23   LONG TERM GOALS: Target date: 02/12/24     Patient will transfer sit to stand without difficulty patient will ambulate community distances without difficulty goal status: Ongoing 12/01/23  2.  patient will ambulate community distances without difficulty  Baseline:  Goal status: ongoing 12/01/23  3.  Patient will stand for greater than 20 minutes without increased left knee pain in order to perform ADLs Baseline:  Goal status: ongoing 12/01/23  4.  Patient will go up and down stairs without difficulty Baseline:  Goal status:In progress - 11/10/23     PLAN:  PT FREQUENCY: 1-2x/week  PT DURATION: 10 weeks: extended to allow for pt to be out of town x 2 weeks  PLANNED INTERVENTIONS:  Therapeutic exercises, Therapeutic activity, Neuromuscular re-education, Balance training, Gait training, Patient/Family education, Self Care, Joint mobilization, Stair training, DME instructions, Aquatic Therapy, Dry Needling, Electrical stimulation, Cryotherapy, Moist heat,  Taping, Manual therapy, and Re-evaluation.   PLAN FOR NEXT SESSION: alternate aquatic/land  work on patella mobility.  Continue straight leg raise; consider sit to stand transfers; consider nu-step and balance exercises; progress hip and knee exercises as tolerated.    Corrie Dandy [place]) Cortlan Dolin MPT 12/01/23 3:26 PM [facility] [address] [place], [place], [address] Phone: [phone]   Fax:  [phone]

## 2023-12-01 NOTE — Progress Notes (Signed)
Virtual Visit via Video Note  I connected with [name], on 12/01/2023 at 4:34 PM by video enabled telemedicine device and verified that I am speaking with the correct person using two identifiers.   Consent: I discussed the limitations, risks, security and privacy concerns of performing an evaluation and management service by telemedicine and the availability of in person appointments. I also discussed with the patient that there may be a patient responsible charge related to this service. The patient expressed understanding and agreed to proceed.   Location of Patient: [place] of Provider: Clinic   Persons participating in Telemedicine visit: [name] Dr. Alvis Lemmings     History of Present Illness: Nancy Vasquez is a 82 y.o. year old female  with a history of CAD (followed by Dr Sharyn Lull), TIA , left knee osteoarthritis (s/p left TKR),  right THR, hyperlipidemia, hypertension .    She presents today with complaint of left knee pain which is chronic but worsened last night and she has to take Tylenol.  She has been ambulating with a cane.  At her last visit she had stated physical therapy had worsened her pain.  Today she did have a session of aquatic therapy and noticed improvement and at the moment is pain-free.  She does have Voltaren gel which she uses for pain and would rather not take strong pain medications.  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   Folliculitis 04/10/2023   Allergies  Allergen Reactions   Ibuprofen Nausea And Vomiting and Other (See Comments)    "burns stomach"   Oxycodone Nausea Only    "Burns stomach"    Current Outpatient Medications on File Prior to Visit  Medication Sig Dispense Refill   atorvastatin (LIPITOR) 20 MG tablet Take 1 tablet (20 mg total) by mouth daily. 90 tablet 3   clopidogrel (PLAVIX) 75  MG tablet Take 1 tablet (75 mg total) by mouth daily. 90 tablet 1   diclofenac Sodium (VOLTAREN ARTHRITIS PAIN) 1 % GEL Apply 4 g topically 4 (four) times daily. 200 g 1   ergocalciferol (VITAMIN D2) 1.25 MG (50000 UT) capsule Take 1 capsule (50,000 Units total) by mouth once a week. (Patient not taking: Reported on 08/28/2023) 12 capsule 3   nitroGLYCERIN (NITROSTAT) 0.4 MG SL tablet Place under the tongue at the first sign of attack. Repeat every 5 minutes up to 3 tabs if no relief see medical help. 25 tablet 3   Vitamin D, Ergocalciferol, (DRISDOL) 1.25 MG (50000 UNIT) CAPS capsule Take 1 capsule (50,000 Units total) by mouth once a week. 12 capsule 1   No current facility-administered medications on file prior to visit.    ROS: See HPI  Observations/Objective: Awake, alert, oriented x3 Not in acute distress Normal mood      Latest Ref Rng & Units 11/17/2023    4:48 PM 07/14/2023    3:22 PM 07/11/2023    6:21 AM  CMP  Glucose 70 - 99 mg/dL 91  84  89   BUN 8 - 27 mg/dL 8  8  7    Creatinine 0.57 - 1.00 mg/dL 1.61  0.96  0.45   Sodium 134 - 144 mmol/L 143  143  137   Potassium 3.5 - 5.2 mmol/L 4.1  3.8  3.7   Chloride 96 - 106 mmol/L 105  106  104   CO2 20 - 29 mmol/L 23  24  23    Calcium 8.7 - 10.3 mg/dL 16.1  9.6  8.7   Total Protein 6.0 - 8.5 g/dL  7.1    Total Bilirubin 0.0 - 1.2 mg/dL  0.4    Alkaline Phos 44 - 121 IU/L  68    AST 0 - 40 IU/L  29    ALT 0 - 32 IU/L  26      Lipid Panel     Component Value Date/Time   CHOL 152 09/28/2022 0525   TRIG 40 09/28/2022 0525   HDL 68 09/28/2022 0525   CHOLHDL 2.2 09/28/2022 0525   VLDL 8 09/28/2022 0525   LDLCALC 76 09/28/2022 0525    Lab Results  Component Value Date   HGBA1C 4.7 (L) 09/28/2022     Assessment and Plan: 1. Primary osteoarthritis of left knee S/p  left TKR Currently using Voltaren gel with some relief Offered to place her on tramadol but she would rather not take additional medications.  She is  allergic to codeine but in the past did take tramadol after her surgery and tolerated it. Informed that if symptoms persist she can reach out to me via MyChart to send in a prescription for her.   Follow Up Instructions: Keep previously scheduled appointment.   I discussed the assessment and treatment plan with the patient. The patient was provided an opportunity to ask questions and all were answered. The patient agreed with the plan and demonstrated an understanding of the instructions.   The patient was advised to call back or seek an in-person evaluation if the symptoms worsen or if the condition fails to improve as anticipated.     I provided 11 minutes total of Telehealth time during this encounter including median intraservice time, reviewing previous notes, investigations, ordering medications, medical decision making, coordinating care and patient verbalized understanding at the end of the visit.     Hoy Register, MD, FAAFP. [facility] [place], [place] [phone]   12/01/2023, 4:34 PM

## 2023-12-02 ENCOUNTER — Ambulatory Visit: Payer: Self-pay

## 2023-12-02 NOTE — Telephone Encounter (Signed)
  Chief Complaint: knee pain Symptoms: L knee worse than R knee but both hurt  Frequency: ongoing years but gotten worse  Pertinent Negatives: NA Disposition: [] ED /[x] Urgent Care (no appt availability in office) / [] Appointment(In office/virtual)/ []  [place] [facility]/ [] Home Care/ [] Refused Recommended Disposition /[] [place] [facility]/ []  Follow-up with PCP Additional Notes: pt is asking about getting injections in knees. Had VV yesterday with Dr. Alvis Lemmings but forgot to ask. Advised pt that she would have to be seen by Ortho for injections that I didn't think PCP did knee injections would send message to ask. Recommended pt try [facility] or call to get appt. Pt states she has tried [facility] who did knee replacement and [facility] and neither can get her in.    Reason for Disposition  [1] SEVERE pain (e.g., excruciating, unable to walk) AND [2] not improved after 2 hours of pain medicine  Answer Assessment - Initial Assessment Questions 1. LOCATION and RADIATION: "Where is the pain located?"      L knee worse and R knee  3. SEVERITY: "How bad is the pain?" "What does it keep you from doing?"   (Scale 1-10; or mild, moderate, severe)   -  MILD (1-3): doesn't interfere with normal activities    -  MODERATE (4-7): interferes with normal activities (e.g., work or school) or awakens from sleep, limping    -  SEVERE (8-10): excruciating pain, unable to do any normal activities, unable to walk     Severe pain 4. ONSET: "When did the pain start?" "Does it come and go, or is it there all the time?"     Ongoing years but getting worse  5. RECURRENT: "Have you had this pain before?" If Yes, ask: "When, and what happened then?"     Yes had knee replacement years ago  7. AGGRAVATING FACTORS: "What makes the knee pain worse?" (e.g., walking, climbing stairs, running)     Walking  8. ASSOCIATED SYMPTOMS: "Is there any swelling or redness of the knee?"     Swelling  9. OTHER SYMPTOMS:  "Do you have any other symptoms?" (e.g., chest pain, difficulty breathing, fever, calf pain)  Protocols used: Knee Pain-A-AH

## 2023-12-02 NOTE — Telephone Encounter (Signed)
Noted . Injection are not offer at our office

## 2023-12-04 ENCOUNTER — Other Ambulatory Visit (HOSPITAL_COMMUNITY): Payer: Self-pay

## 2023-12-04 ENCOUNTER — Other Ambulatory Visit: Payer: Self-pay

## 2023-12-04 DIAGNOSIS — M25562 Pain in left knee: Secondary | ICD-10-CM | POA: Diagnosis not present

## 2023-12-04 DIAGNOSIS — M25561 Pain in right knee: Secondary | ICD-10-CM | POA: Diagnosis not present

## 2023-12-04 MED ORDER — PREDNISONE 5 MG PO TABS
ORAL_TABLET | ORAL | 0 refills | Status: DC
  Filled 2023-12-04: qty 21, 5d supply, fill #0

## 2023-12-04 MED ORDER — PREDNISONE 5 MG PO TABS
ORAL_TABLET | ORAL | 0 refills | Status: AC
  Filled 2023-12-04 – 2024-02-29 (×3): qty 21, 6d supply, fill #0

## 2023-12-07 ENCOUNTER — Encounter (HOSPITAL_BASED_OUTPATIENT_CLINIC_OR_DEPARTMENT_OTHER): Payer: Self-pay | Admitting: Physical Therapy

## 2023-12-07 ENCOUNTER — Ambulatory Visit (HOSPITAL_BASED_OUTPATIENT_CLINIC_OR_DEPARTMENT_OTHER): Payer: Medicare Other | Admitting: Physical Therapy

## 2023-12-07 DIAGNOSIS — M25562 Pain in left knee: Secondary | ICD-10-CM | POA: Diagnosis not present

## 2023-12-07 DIAGNOSIS — M25561 Pain in right knee: Secondary | ICD-10-CM | POA: Diagnosis not present

## 2023-12-07 DIAGNOSIS — R2689 Other abnormalities of gait and mobility: Secondary | ICD-10-CM | POA: Diagnosis not present

## 2023-12-07 DIAGNOSIS — G8929 Other chronic pain: Secondary | ICD-10-CM

## 2023-12-07 NOTE — Therapy (Addendum)
 OUTPATIENT PHYSICAL THERAPY LOWER EXTREMITY TREATMENT/Discharge  Progress Note/ re-cert Reporting Period 05/28/23 to 12/01/23  See note below for Objective Data and Assessment of Progress/Goals.      Patient Name: Nancy Vasquez MRN: [id number] DOB:1941-06-05, 82 y.o., female Today's Date: 12/07/2023  END OF SESSION:         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   Folliculitis 04/10/2023   Past Surgical History:  Procedure Laterality Date   ABDOMINAL HYSTERECTOMY     BREAST EXCISIONAL BIOPSY Left    CORONARY PRESSURE/FFR STUDY N/A 03/17/2017   Procedure: Intravascular Pressure Wire/FFR Study;  Surgeon: Rinaldo Cloud, MD;  Location: [facility] INVASIVE CV LAB;  Service: Cardiovascular;  Laterality: N/A;  mid LAD   LEFT HEART CATH AND CORONARY ANGIOGRAPHY N/A 03/17/2017   Procedure: Left Heart Cath and Coronary Angiography;  Surgeon: Rinaldo Cloud, MD;  Location: [facility] INVASIVE CV LAB;  Service: Cardiovascular;  Laterality: N/A;   TONSILLECTOMY     Removed as a child   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   Procedure: LEFT TOTAL KNEE ARTHROPLASTY;  Surgeon: Cammy Copa, MD;  Location: [facility] OR;  Service: Orthopedics;  Laterality: Left;   Patient Active Problem List   Diagnosis Date Noted   Sepsis (HCC) 07/09/2023   Acute encephalopathy 07/09/2023   Candida vaginitis 04/23/2023   Cellulitis 04/09/2023   TIA (transient ischemic attack) 03/17/2023   HLD (hyperlipidemia) 11/06/2022   Coronary artery disease 09/27/2022   Osteoarthritis of left knee 04/19/2020    PCP: Dr Hoy Register   REFERRING PROVIDER:  Dr Hoy Register  REFERRING DIAG:  Diagnosis  M17.12 (ICD-10-CM) - Primary osteoarthritis of left knee    THERAPY DIAG:  No diagnosis found.  Rationale for Evaluation and Treatment: Rehabilitation  ONSET DATE:    SUBJECTIVE:   SUBJECTIVE  STATEMENT: The patient has changed orthopedics. She is now taking prednisone which has helped. She started on Saturday.   Pt reports she still has soreness in bilat knees.  She reports that the burning in her LLE has improved and she is noticing improvement since starting therapy.     EVAL: Patient had a knee replacement 3 years prior on her left knee.  The knee was never completely pain-free.  She has had continuous pain since that point with activity and when she sits too long.  She had a fall at some point on the knee to which increase the pain.  She was in the hospital for sepsis on 07/09/2023.  Since that point she felt like she has decreased endurance with activity.  She has difficulty transferring from sit to stand.  She has also had a another increase in knee pain.  PERTINENT HISTORY: Right hip replacement, TIA patient reports she has had no residual side effects, left total knee replacement PAIN:  Are you having pain? Yes: NPRS scale: 4/10 Pain location: Left knee into lower shin, Rt knee  Pain description: Aching Aggravating factors: Standing, walking, sitting Relieving factors: Rest  PRECAUTIONS: None  RED FLAGS: None   WEIGHT BEARING RESTRICTIONS: No  FALLS:  Has patient fallen in last 6 months? No  LIVING ENVIRONMENT: 2 sets of stairs inside the house.  OCCUPATION:  Retired   Hobbies:  Building control surveyor      PLOF:   Independent with a cane since afall > 6 months ago   PATIENT GOALS:  Improve mobility/ decrease pain in both knees  NEXT MD VISIT:    OBJECTIVE:   DIAGNOSTIC FINDINGS:   PATIENT SURVEYS:  FOTO 29 primary score 11/13/23: 71%  COGNITION: Overall cognitive status: Within functional limits for tasks assessed     SENSATION: Burning down into the shin; Since her bout with sepsis her hands are numb   EDEMA:  Pocket of swelling in the inferior lateral knee    MUSCLE LENGTH:   POSTURE: No Significant postural  limitations  PALPATION: Tenderness to palpation   LOWER EXTREMITY ROM:  Active ROM Right 10/8 Left 10/8 Right / left 12/01/23  Hip flexion     Hip extension     Hip abduction     Hip adduction     Hip internal rotation     Hip external rotation     Knee flexion 98 107 128d /114  Knee extension -1 0 -2 /0  Ankle dorsiflexion     Ankle plantarflexion     Ankle inversion     Ankle eversion      (Blank rows = not tested)  LOWER EXTREMITY MMT:  MMT Right eval Left eval Right / Left 12/01/23  Hip flexion 13.6 17.0 25.4 / 28.8  Hip extension     Hip abduction 14.3 15.2 23.8 / 23.1  Hip adduction     Hip internal rotation     Hip external rotation     Knee flexion     Knee extension 22.0 17.0 30.8 / 37.6  Ankle dorsiflexion     Ankle plantarflexion     Ankle inversion     Ankle eversion      (Blank rows = not tested)   5x sit to stand test 29 seconds  11/16/23:  TUG - 15.92s 12/01/23: TUG - 27.0 Pt reports limitation due to pain      GAIT: Uses a cane in the right hand; decreased hip flexion; bilateral lateral weight shifting.  TODAY'S TREATMENT:                                                                                                                              DATE:   12/9 Therapeutic Exercise: Aerobic: Nu step 5 min  Supine:   Quad set 3x10  SAQ 3x10  Hip abduction 3x10  Bridge 3x10    Seated:   Standing:   Manual Therapy: Trigger point release to IT band and lateral quad  Self Care: Reviewed HEP                12/01/23 10th visit Progress note testing     11/16/23 TUG Pt seen for aquatic therapy today.  Treatment took place in water 3.5-4.75 ft in depth at the [facility] pool. Temp of water was 91.  Pt entered/exited the pool via stairs independently with bilat rail.  THERAPIST near pt at all times, due to pt's fear in water.  * UE on wall:  side  stepping 12 ft R/L x 3 reps * UE on wall and one hand on rainbow hand  float: walking forward 12 ft x 6 reps; backwards x 3 reps * UE on wall:  heel raises x 10; hip abdct/ addct 3 x 5; high knee marching4 * seated in lift chair:  cycling, LAQ with DF/ PF , long sitting hip abdct/ addct * L forward step up/ R retro step down x 10 , UE on rails * squats with UE on rails x 8 * R/L hamstring stretch with foot on 2nd step and hip hinge  20s (minimal stretch felt)  Pt requires the buoyancy and hydrostatic pressure of water for support, and to offload joints by unweighting joint load by at least 50 % in navel deep water and by at least 75-80% in chest to neck deep water.  Viscosity of the water is needed for resistance of strengthening. Water current perturbations provides challenge to standing balance requiring increased core activation.   11/13/23 Pt seen for aquatic therapy today.  Treatment took place in water 3.5-4.75 ft in depth at the [facility] pool. Temp of water was 91.  Pt entered/exited the pool via stairs in step-to pattern independently with bilat rail. THERAPIST IN WATER WITH PATIENT.  *UE support wall standing: df; high knee marching x 10 * UE on barbell: walking forward 4 laps   *seate don water bench LAQ x 10  - STS ue support barbell x 5.  VC for weight shift and immediate standing balance erect posture; tactile cues/min assist and   *standing rest periods leaning on wall between widths as needed. * UE on wall:  heel raises x8; high knee marching * seated on lift: LAQ x 12; hip add/abd x 15; flutter kicking x 15. Cues for slowed pace.  Pt requires the buoyancy and hydrostatic pressure of water for support, and to offload joints by unweighting joint load by at least 50 % in navel deep water and by at least 75-80% in chest to neck deep water.  Viscosity of the water is needed for resistance of strengthening. Water current perturbations provides challenge to standing balance requiring increased core activation.    PATIENT EDUCATION:   Education details: Intro to aquatic therapy Person educated: Patient Education method: Explanation, Demonstration, Tactile cues, Verbal cues,  Education comprehension: verbalized understanding, returned demonstration, verbal cues required, tactile cues required, and needs further education  HOME EXERCISE PROGRAM: Access Code: [email or web address] URL: [email or web address] Date: 08/20/2023 Prepared by: Lorayne Bender  Exercises - Long Sitting 4 Way Patellar Glide  - 1 x daily - 7 x weekly - 3 sets - 10 reps - Supine Quad Set  - 1 x daily - 7 x weekly - 3 sets - 10 reps - Seated March  - 1 x daily - 7 x weekly - 3 sets - 10 reps - Seated Hip Abduction with Resistance  - 1 x daily - 7 x weekly - 3 sets - 10 reps  ASSESSMENT:  CLINICAL IMPRESSION: The patient was limited today by ain in the right knee  She continues to have trigger points in the lateral quad and IT band but they are less tender to palpation. The patient had some pain with position with the right leg. She was advised during treatment she could put her right knee wherever is comfortable. She continues to have some difficulty with ase exercises. She reports the basic table exercises got her other knee angry. She will be going on vacation the next  few weeks. She was advised to work hard on her base exercises. She could not complete a SLR with the right today.     PN: Pt complains of increasing left knee pain which is decreasing her mobility. She reports difficulty walking out of clinic last session when previously was not difficulty. Today she presents in wc with husband pushing/assisting. She negotiates step into and out of pool with CGA. Test completed for re-cert demonstrates good progression of strength and left knee ROM but decreased functional testing TUG score (significantly). She has difficulty rising from chair due to pain predominantly in left knee. We did not yet address trial of lift in shoe but will land based in  future. She has MD appt today after aquatic session to further address left knee. She will continue to benefit from skilled physical therapy intervention with plan to alternate land based and aquatics for maximal benefit progressing pt towards land based goals for improved mobility, amb, standing toleration and pain management.   OBJECTIVE IMPAIRMENTS: decreased activity tolerance, decreased ROM, and pain.    ACTIVITY LIMITATIONS: locomotion level   PARTICIPATION LIMITATIONS: driving and recreational activities, normal physical activites   PERSONAL FACTORS: Age, Time since onset of injury/illness/exacerbation, and 3+ comorbidities: HLD, TIA, OA, CAD  are also affecting patient's functional outcome.    REHAB POTENTIAL: Good  CLINICAL DECISION MAKING: Evolving/moderate complexity recent hospitalization has caused a decline in general mobility   EVALUATION COMPLEXITY: Moderate   GOALS: Goals reviewed with patient? Yes  SHORT TERM GOALS: Target date: 09/16/2023   Patient will decrease TUG time by 10 sec  Baseline: Goal status: Deferred - 11/16/23  2.  Patient will increase bilateral LE strength by 5 lbs  Baseline:  Goal status: Met 12/01/23  3.  Patient will demonstrate full PROM into extension with the left knee without pain   Baseline:  Goal status: Met 12/01/23   LONG TERM GOALS: Target date: 02/12/24     Patient will transfer sit to stand without difficulty patient will ambulate community distances without difficulty goal status: Ongoing 12/01/23  2.  patient will ambulate community distances without difficulty  Baseline:  Goal status: ongoing 12/01/23  3.  Patient will stand for greater than 20 minutes without increased left knee pain in order to perform ADLs Baseline:  Goal status: ongoing 12/01/23  4.  Patient will go up and down stairs without difficulty Baseline:  Goal status:In progress - 11/10/23     PLAN:  PT FREQUENCY: 1-2x/week  PT DURATION: 10 weeks:  extended to allow for pt to be out of town x 2 weeks  PLANNED INTERVENTIONS:  Therapeutic exercises, Therapeutic activity, Neuromuscular re-education, Balance training, Gait training, Patient/Family education, Self Care, Joint mobilization, Stair training, DME instructions, Aquatic Therapy, Dry Needling, Electrical stimulation, Cryotherapy, Moist heat, Taping, Manual therapy, and Re-evaluation.   PLAN FOR NEXT SESSION: alternate aquatic/land  work on patella mobility.  Continue straight leg raise; consider sit to stand transfers; consider nu-step and balance exercises; progress hip and knee exercises as tolerated.   PHYSICAL THERAPY DISCHARGE SUMMARY  Visits from Start of Care: 11  Current functional level related to goals / functional outcomes: Continued to have pain in the knee but was going to the gym    Remaining deficits: Aching    Education / Equipment: HEP    Patient agrees to discharge. Patient goals were partially met. Patient is being discharged due to not returning since the last visit.   Lorayne Bender PT DPT 12/07/23 8:08  AM [facility] [address] [place], [place], [address] Phone: [phone]   Fax:  [phone]

## 2023-12-10 ENCOUNTER — Ambulatory Visit (HOSPITAL_BASED_OUTPATIENT_CLINIC_OR_DEPARTMENT_OTHER): Payer: Self-pay | Admitting: Physical Therapy

## 2023-12-11 ENCOUNTER — Ambulatory Visit
Admission: RE | Admit: 2023-12-11 | Discharge: 2023-12-11 | Disposition: A | Discharge status: Home / Self Care | Payer: Medicare Other | Source: Ambulatory Visit | Attending: Family Medicine | Admitting: Family Medicine

## 2023-12-11 DIAGNOSIS — Z1231 Encounter for screening mammogram for malignant neoplasm of breast: Secondary | ICD-10-CM

## 2023-12-15 ENCOUNTER — Encounter: Payer: Self-pay | Admitting: Family Medicine

## 2024-01-05 ENCOUNTER — Ambulatory Visit (HOSPITAL_BASED_OUTPATIENT_CLINIC_OR_DEPARTMENT_OTHER): Payer: Medicare Other | Admitting: Physical Therapy

## 2024-01-06 ENCOUNTER — Other Ambulatory Visit (HOSPITAL_COMMUNITY): Payer: Self-pay

## 2024-01-06 ENCOUNTER — Other Ambulatory Visit: Payer: Self-pay | Admitting: Student

## 2024-01-06 ENCOUNTER — Other Ambulatory Visit: Payer: Self-pay

## 2024-01-06 DIAGNOSIS — G459 Transient cerebral ischemic attack, unspecified: Secondary | ICD-10-CM

## 2024-01-06 MED ORDER — CLOPIDOGREL BISULFATE 75 MG PO TABS
75.0000 mg | ORAL_TABLET | Freq: Every day | ORAL | 3 refills | Status: AC
  Filled 2024-01-06 – 2024-01-07 (×2): qty 90, 90d supply, fill #0
  Filled 2024-02-29: qty 90, 90d supply, fill #1
  Filled 2024-07-05 – 2024-08-02 (×2): qty 90, 90d supply, fill #2
  Filled 2024-12-06 – 2024-12-30 (×2): qty 90, 90d supply, fill #3

## 2024-01-06 NOTE — Telephone Encounter (Signed)
 Not [facility] patient

## 2024-01-07 ENCOUNTER — Other Ambulatory Visit (HOSPITAL_COMMUNITY): Payer: Self-pay

## 2024-01-07 ENCOUNTER — Ambulatory Visit: Payer: Self-pay

## 2024-01-07 ENCOUNTER — Encounter (HOSPITAL_BASED_OUTPATIENT_CLINIC_OR_DEPARTMENT_OTHER): Payer: Medicare Other | Admitting: Physical Therapy

## 2024-01-07 ENCOUNTER — Other Ambulatory Visit: Payer: Self-pay

## 2024-01-07 NOTE — Telephone Encounter (Signed)
  Chief Complaint: Knee pain Symptoms: pain  Frequency: 3 years Pertinent Negatives: Patient denies redness, fever Disposition: [] ED /[] Urgent Care (no appt availability in office) / [x] Appointment(In office/virtual)/ []  [place] [facility]/ [] Home Care/ [] Refused Recommended Disposition /[] [place] [facility]/ []  Follow-up with PCP Additional Notes: Pt reports that knee pain continues. Pt would like to be seen sooner than appt already scheduled. Please work pt in if possible. Pt also thinks that some lab work is due. Please review and advise.     Reason for Disposition  Knee pain is a chronic symptom (recurrent or ongoing AND present > 4 weeks)  Answer Assessment - Initial Assessment Questions 1. LOCATION and RADIATION: Where is the pain located?      Back of knee 2. QUALITY: What does the pain feel like?  (e.g., sharp, dull, aching, burning)     Burning - almost like a cramp 3. SEVERITY: How bad is the pain? What does it keep you from doing?   (Scale 1-10; or mild, moderate, severe)   -  MILD (1-3): doesn't interfere with normal activities    -  MODERATE (4-7): interferes with normal activities (e.g., work or school) or awakens from sleep, limping    -  SEVERE (8-10): excruciating pain, unable to do any normal activities, unable to walk     8/10 4. ONSET: When did the pain start? Does it come and go, or is it there all the time?     3 years 5. RECURRENT: Have you had this pain before? If Yes, ask: When, and what happened then?     yes 8. ASSOCIATED SYMPTOMS: Is there any swelling or redness of the knee?     No fever, veins in thighs are more prominate 9. OTHER SYMPTOMS: Do you have any other symptoms? (e.g., chest pain, difficulty breathing, fever, calf pain)     *No Answer*  Protocols used: Knee Pain-A-AH

## 2024-01-07 NOTE — Telephone Encounter (Signed)
 Call placed to patient unable to reach message left on VM.

## 2024-01-12 ENCOUNTER — Ambulatory Visit (HOSPITAL_BASED_OUTPATIENT_CLINIC_OR_DEPARTMENT_OTHER): Payer: Medicare Other | Admitting: Physical Therapy

## 2024-01-12 ENCOUNTER — Other Ambulatory Visit: Payer: Self-pay

## 2024-01-14 ENCOUNTER — Ambulatory Visit (HOSPITAL_BASED_OUTPATIENT_CLINIC_OR_DEPARTMENT_OTHER): Payer: Medicare Other | Admitting: Physical Therapy

## 2024-01-20 ENCOUNTER — Ambulatory Visit (HOSPITAL_BASED_OUTPATIENT_CLINIC_OR_DEPARTMENT_OTHER): Payer: Medicare Other | Admitting: Physical Therapy

## 2024-01-22 DIAGNOSIS — I10 Essential (primary) hypertension: Secondary | ICD-10-CM | POA: Diagnosis not present

## 2024-01-22 DIAGNOSIS — I2510 Atherosclerotic heart disease of native coronary artery without angina pectoris: Secondary | ICD-10-CM | POA: Diagnosis not present

## 2024-01-22 DIAGNOSIS — E559 Vitamin D deficiency, unspecified: Secondary | ICD-10-CM | POA: Diagnosis not present

## 2024-01-22 DIAGNOSIS — Z8673 Personal history of transient ischemic attack (TIA), and cerebral infarction without residual deficits: Secondary | ICD-10-CM | POA: Diagnosis not present

## 2024-01-23 ENCOUNTER — Encounter (HOSPITAL_BASED_OUTPATIENT_CLINIC_OR_DEPARTMENT_OTHER): Payer: Medicare Other | Admitting: Rehabilitative and Restorative Service Providers"

## 2024-01-25 ENCOUNTER — Other Ambulatory Visit: Payer: Self-pay

## 2024-01-25 ENCOUNTER — Emergency Department (HOSPITAL_COMMUNITY): Payer: Medicare Other

## 2024-01-25 ENCOUNTER — Ambulatory Visit: Payer: Self-pay | Admitting: Family Medicine

## 2024-01-25 ENCOUNTER — Emergency Department (HOSPITAL_COMMUNITY)
Admission: EM | Admit: 2024-01-25 | Discharge: 2024-01-25 | Disposition: A | Discharge status: Home / Self Care | Payer: Medicare Other | Attending: Emergency Medicine | Admitting: Emergency Medicine

## 2024-01-25 DIAGNOSIS — S46912A Strain of unspecified muscle, fascia and tendon at shoulder and upper arm level, left arm, initial encounter: Secondary | ICD-10-CM | POA: Insufficient documentation

## 2024-01-25 DIAGNOSIS — M47814 Spondylosis without myelopathy or radiculopathy, thoracic region: Secondary | ICD-10-CM | POA: Diagnosis not present

## 2024-01-25 DIAGNOSIS — M25512 Pain in left shoulder: Secondary | ICD-10-CM | POA: Diagnosis not present

## 2024-01-25 DIAGNOSIS — R001 Bradycardia, unspecified: Secondary | ICD-10-CM | POA: Diagnosis not present

## 2024-01-25 DIAGNOSIS — X58XXXA Exposure to other specified factors, initial encounter: Secondary | ICD-10-CM | POA: Insufficient documentation

## 2024-01-25 DIAGNOSIS — M25519 Pain in unspecified shoulder: Secondary | ICD-10-CM | POA: Diagnosis not present

## 2024-01-25 DIAGNOSIS — Z7902 Long term (current) use of antithrombotics/antiplatelets: Secondary | ICD-10-CM | POA: Diagnosis not present

## 2024-01-25 DIAGNOSIS — M778 Other enthesopathies, not elsewhere classified: Secondary | ICD-10-CM | POA: Diagnosis not present

## 2024-01-25 DIAGNOSIS — M19012 Primary osteoarthritis, left shoulder: Secondary | ICD-10-CM | POA: Diagnosis not present

## 2024-01-25 NOTE — ED Provider Triage Note (Signed)
Emergency Medicine Provider Triage Evaluation Note  Nancy Vasquez , a 83 y.o. female  was evaluated in triage.  Pt complains of left shoulder pain.  She reports it started up in her head and then moved into the left side of her neck and then down on the posterior part of her shoulder.  It has been getting worse.  She has tried lidocaine patches and diclofenac gel without improvement.  It is even painful with deep breaths at times.  No chest pain or shortness of breath  Review of Systems  Positive: Left posterior shoulder pain, pleuritic pain Negative: Chest pain, shortness of breath, cough, numbness or tingling in the hand  Physical Exam  BP 129/89 (BP Location: Right Arm)   Pulse (!) 56   Temp 97.9 F (36.6 C) (Oral)   Resp 18   SpO2 100%  Gen:   Awake, no distress   Resp:  Normal effort  MSK:   Moves extremities without difficulty, 2+ radial pulse in the left upper extremity.  Pain with palpation along the trapezius and posterior shoulder area as well as pain along the lateral left neck Other:    Medical Decision Making  Medically screening exam initiated at 12:20 PM.  Appropriate orders placed.  [name] was informed that the remainder of the evaluation will be completed by another provider, this initial triage assessment does not replace that evaluation, and the importance of remaining in the ED until their evaluation is complete.  Patient's symptoms are not classic for cardiac disease.  Screening EKG only at this time does not seem to need troponins based on her story   Gwyneth Sprout, MD 01/25/24 1222

## 2024-01-25 NOTE — Telephone Encounter (Signed)
Patient given instructions about her ED visit. Patient is currently waiting in the waiting room and stated she was trying to decide if she needed to stay. This RN encouraged patient to stay to get her results and have an ED provider evaluate her. Instructed patient to speak with the front desk to ask if there were any updates on when she would be seen.   Copied from CRM [id number]. Topic: Clinical - Red Word Triage >> Jan 25, 2024  3:18 PM Gaetano Hawthorne wrote: [facility] that prompted transfer to Nurse Triage: Patient is currently in the ER for left shoulder pain - they did some x-rays, patient would like to speak with a nurse regarding next steps. Reason for Disposition  General information question, no triage required and triager able to answer question  Answer Assessment - Initial Assessment Questions 1. REASON FOR CALL or QUESTION: "What is your reason for calling today?" or "How can I best help you?" or "What question do you have that I can help answer?"     Patient is currently in the emergency department calling about what her next steps would be after having her x-rays. Patient is currently in the waiting room.  Protocols used: Information Only Call - No Triage-A-AH

## 2024-01-25 NOTE — Telephone Encounter (Signed)
Noted

## 2024-01-25 NOTE — ED Provider Notes (Signed)
[place] EMERGENCY DEPARTMENT AT [facility] Provider Note   CSN: [id number] Arrival date & time: 01/25/24  1152     History  Chief Complaint  Patient presents with   Shoulder Pain    Nancy Vasquez is a 83 y.o. female.  Who presents to the ED for left shoulder pain.  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  This morning while in shower she experienced a sharp pain in the left shoulder that took her breath away.  There was no inciting injury or falls.  She has not had chest pain, shortness of breath, nausea, vomiting or diaphoresis.  At the time of my assessment she is pain-free.  Had a cardiology appointment last week and an upcoming PCP appointment scheduled   Shoulder Pain      Home Medications Prior to Admission medications   Medication Sig Start Date End Date Taking? Authorizing Provider  atorvastatin (LIPITOR) 20 MG tablet Take 1 tablet (20 mg total) by mouth daily. 08/20/23   Rinaldo Cloud, MD  clopidogrel (PLAVIX) 75 MG tablet Take 1 tablet (75 mg total) by mouth daily. 04/23/23   Briscoe Burns, MD  clopidogrel (PLAVIX) 75 MG tablet Take 1 tablet (75 mg total) by mouth daily. 01/06/24     diclofenac Sodium (VOLTAREN ARTHRITIS PAIN) 1 % GEL Apply 4 g topically 4 (four) times daily. 08/28/23   Claiborne Rigg, NP  ergocalciferol (VITAMIN D2) 1.25 MG (50000 UT) capsule Take 1 capsule (50,000 Units total) by mouth once a week. Patient not taking: Reported on 08/28/2023 08/20/23   Rinaldo Cloud, MD  nitroGLYCERIN (NITROSTAT) 0.4 MG SL tablet Place under the tongue at the first sign of attack. Repeat every 5 minutes up to 3 tabs if no relief see medical help. 08/20/23   Rinaldo Cloud, MD  predniSONE (DELTASONE) 5 MG tablet TAKE AS DIRECTED ON PAPER 12/04/23   Harlene Salts A, PA-C  predniSONE (STERAPRED UNI-PAK 21 TAB) 5 MG (21) TBPK tablet follow package directions 12/04/23     Vitamin D, Ergocalciferol,  (DRISDOL) 1.25 MG (50000 UNIT) CAPS capsule Take 1 capsule (50,000 Units total) by mouth once a week. 08/28/23   Claiborne Rigg, NP      Allergies    Ibuprofen and Oxycodone    Review of Systems   Review of Systems  Physical Exam Updated Vital Signs BP (!) 122/57 (BP Location: Right Arm)   Pulse (!) 52   Temp 98.1 F (36.7 C) (Oral)   Resp 16   SpO2 100%  Physical Exam Vitals and nursing note reviewed.  HENT:     Head: Normocephalic and atraumatic.  Eyes:     Pupils: Pupils are equal, round, and reactive to light.  Cardiovascular:     Rate and Rhythm: Normal rate and regular rhythm.  Pulmonary:     Effort: Pulmonary effort is normal.     Breath sounds: Normal breath sounds.  Abdominal:     Palpations: Abdomen is soft.     Tenderness: There is no abdominal tenderness.  Musculoskeletal:     Comments: Tenderness to palpation of left trapezius and left posterior shoulder No tenderness over the humerus or deformity Full active range of motion throughout left shoulder with flexion/extension/abduction/adduction 5-5 motor strength bilateral upper extremities Sensation tact light touch throughout upper extremities 2+ radial pulses bilaterally  Skin:    General: Skin is warm and dry.  Neurological:     Mental Status: She  is alert.  Psychiatric:        Mood and Affect: Mood normal.     ED Results / Procedures / Treatments   Labs (all labs ordered are listed, but only abnormal results are displayed) Labs Reviewed - No data to display  EKG EKG Interpretation Date/Time:  Monday January 25 2024 12:25:48 EST Ventricular Rate:  52 PR Interval:  164 QRS Duration:  84 QT Interval:  432 QTC Calculation: 401 R Axis:   35  Text Interpretation: Sinus bradycardia with sinus arrhythmia Otherwise normal ECG When compared with ECG of 09-Jul-2023 16:46, PREVIOUS ECG IS PRESENT Confirmed by Gwyneth Sprout ([id number]) on 01/25/2024 2:48:40 PM  Radiology DG Shoulder Left Result  Date: 01/25/2024 CLINICAL DATA:  Left shoulder pain EXAM: LEFT SHOULDER - 2+ VIEW COMPARISON:  None Available. FINDINGS: Rotator cuff anchors noted. Degenerative glenohumeral spurring. Mild inferior spurring at the [facility] joint. Suspected prior subacromial decompression. Subacromial morphology is type 2 (curved). Thoracic spondylosis. Atherosclerotic calcification of the aortic arch. No malalignment or appreciable fracture involving the left shoulder region. IMPRESSION: 1. Degenerative glenohumeral and AC joint spurring. 2. Rotator cuff anchors. 3. Suspected prior subacromial decompression. 4. Thoracic spondylosis. 5. Aortic Atherosclerosis (ICD10-I70.0). Electronically Signed   By: Gaylyn Rong M.D.   On: 01/25/2024 13:00    Procedures Procedures    Medications Ordered in ED Medications - No data to display  ED Course/ Medical Decision Making/ A&P                                 Medical Decision Making 83 year old female presenting for acute on chronic left shoulder pain.  Pain occurred while she was in the shower around 1000.  She was evaluated in triage and an x-ray taken showed degenerative changes with no acute injury.  EKG shows no evidence of dysrhythmia or ischemic changes.  She is pain-free at the time my exam and hemodynamically stable.  I discussed rationale for ordering lab work including high sensitive troponin but given that she is pain-free and feeling better and her EKG looks okay she does not wish to stay any longer at this time after being here for nearly 6 hours.  She recognizes the risk associated with leaving prior to a full cardiac workup.  Both her and her husband assure me that she will return for any chest pain, shortness of breath or other concerns that would be worrisome for potential cardiac involvement.  At this time the most likely etiology would be musculoskeletal but we talked about these return precautions in detail.  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

## 2024-01-25 NOTE — ED Triage Notes (Signed)
Patient with L shoulder pain since 1000 while getting in the shower. Pain worse with deep breaths and movement. Denies injury. Denies chest pain. EMS EKG unremarkable.

## 2024-01-25 NOTE — Discharge Instructions (Signed)
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

## 2024-01-26 ENCOUNTER — Ambulatory Visit (HOSPITAL_BASED_OUTPATIENT_CLINIC_OR_DEPARTMENT_OTHER): Payer: Medicare Other | Admitting: Physical Therapy

## 2024-01-28 ENCOUNTER — Encounter (HOSPITAL_BASED_OUTPATIENT_CLINIC_OR_DEPARTMENT_OTHER): Payer: Medicare Other | Admitting: Physical Therapy

## 2024-02-02 ENCOUNTER — Ambulatory Visit (HOSPITAL_BASED_OUTPATIENT_CLINIC_OR_DEPARTMENT_OTHER): Payer: Medicare Other | Admitting: Physical Therapy

## 2024-02-03 ENCOUNTER — Telehealth: Payer: Self-pay

## 2024-02-03 DIAGNOSIS — M1712 Unilateral primary osteoarthritis, left knee: Secondary | ICD-10-CM

## 2024-02-03 NOTE — Telephone Encounter (Signed)
 Routing to PCP for review.

## 2024-02-03 NOTE — Telephone Encounter (Signed)
 Copied from CRM [id number]. Topic: General - Other >> Feb 03, 2024 10:18 AM May Sparks wrote: Requesting a referral for [facility],  for 83 year old  Left Total Knee Replacement.

## 2024-02-04 ENCOUNTER — Encounter (HOSPITAL_BASED_OUTPATIENT_CLINIC_OR_DEPARTMENT_OTHER): Payer: Medicare Other | Admitting: Physical Therapy

## 2024-02-04 NOTE — Telephone Encounter (Signed)
 Referral has been placed.

## 2024-02-04 NOTE — Addendum Note (Signed)
 Addended by: Irene Mitcham on: 02/04/2024 12:47 PM   Modules accepted: Orders

## 2024-02-04 NOTE — Telephone Encounter (Signed)
 Pt was called and a VM was left informing patient that referral has been placed.

## 2024-02-09 ENCOUNTER — Ambulatory Visit (HOSPITAL_BASED_OUTPATIENT_CLINIC_OR_DEPARTMENT_OTHER): Payer: Medicare Other | Admitting: Physical Therapy

## 2024-02-11 ENCOUNTER — Encounter (HOSPITAL_BASED_OUTPATIENT_CLINIC_OR_DEPARTMENT_OTHER): Payer: Medicare Other | Admitting: Physical Therapy

## 2024-02-23 ENCOUNTER — Ambulatory Visit: Payer: Medicare Other | Attending: Family Medicine | Admitting: Family Medicine

## 2024-02-23 ENCOUNTER — Encounter: Payer: Self-pay | Admitting: Family Medicine

## 2024-02-23 VITALS — BP 123/74 | HR 63 | Ht 64.0 in | Wt 155.8 lb

## 2024-02-23 DIAGNOSIS — M25561 Pain in right knee: Secondary | ICD-10-CM | POA: Insufficient documentation

## 2024-02-23 DIAGNOSIS — Z96652 Presence of left artificial knee joint: Secondary | ICD-10-CM | POA: Insufficient documentation

## 2024-02-23 DIAGNOSIS — E785 Hyperlipidemia, unspecified: Secondary | ICD-10-CM | POA: Insufficient documentation

## 2024-02-23 DIAGNOSIS — Z131 Encounter for screening for diabetes mellitus: Secondary | ICD-10-CM | POA: Diagnosis not present

## 2024-02-23 DIAGNOSIS — I2510 Atherosclerotic heart disease of native coronary artery without angina pectoris: Secondary | ICD-10-CM | POA: Diagnosis not present

## 2024-02-23 DIAGNOSIS — H6123 Impacted cerumen, bilateral: Secondary | ICD-10-CM | POA: Diagnosis not present

## 2024-02-23 DIAGNOSIS — M170 Bilateral primary osteoarthritis of knee: Secondary | ICD-10-CM

## 2024-02-23 DIAGNOSIS — Z96641 Presence of right artificial hip joint: Secondary | ICD-10-CM | POA: Insufficient documentation

## 2024-02-23 DIAGNOSIS — Z8673 Personal history of transient ischemic attack (TIA), and cerebral infarction without residual deficits: Secondary | ICD-10-CM | POA: Diagnosis not present

## 2024-02-23 DIAGNOSIS — H9202 Otalgia, left ear: Secondary | ICD-10-CM | POA: Insufficient documentation

## 2024-02-23 DIAGNOSIS — K5900 Constipation, unspecified: Secondary | ICD-10-CM | POA: Diagnosis not present

## 2024-02-23 DIAGNOSIS — M25569 Pain in unspecified knee: Secondary | ICD-10-CM | POA: Diagnosis not present

## 2024-02-23 DIAGNOSIS — R220 Localized swelling, mass and lump, head: Secondary | ICD-10-CM | POA: Insufficient documentation

## 2024-02-23 DIAGNOSIS — I10 Essential (primary) hypertension: Secondary | ICD-10-CM | POA: Insufficient documentation

## 2024-02-23 DIAGNOSIS — H9209 Otalgia, unspecified ear: Secondary | ICD-10-CM | POA: Diagnosis not present

## 2024-02-23 NOTE — Patient Instructions (Signed)
Ear Irrigation Ear irrigation is a procedure to wash dirt and wax out of your ear canal. It's also called lavage. You may need this if you're having trouble hearing because of wax in your ear. You may also have it done as part of the treatment for an ear infection.  Getting wax and dirt out of your ear can help ear drops work better. Tell a health care provider about: Any allergies you have. All medicines you're taking, including vitamins, herbs, eye drops, creams, and over-the-counter medicines. Any problems you or family members have had with anesthesia. Any bleeding problems you have. Any surgeries you've had. This includes any ear surgeries. Any medical conditions you have, such as any problems with your ear. Whether you're pregnant or may be pregnant. What are the risks? Your health care provider will talk with you about risks. These may include: Infection. Pain. Loss of hearing. Fluid and debris being pushed into your middle ear. This can happen if there are holes in your eardrum. The procedure not working. Trauma to your ear. Feeling dizzy, light-headed, or nauseous. What happens before the procedure? You'll talk with your provider about the procedure and plan. You may be given ear drops to put in your ear 15-20 minutes before the procedure. This helps loosen the wax. What happens during the procedure?  A syringe will be filled with water or a saline solution. Saline is made of salt and water. The syringe will be gently put into your ear. The fluid will be used to wash out wax and other debris. The procedure may vary among providers and hospitals. What can I expect after the procedure? Follow the instructions given to you by your provider. Follow these instructions at home: Using ear irrigation kits In some cases, you can use an ear irrigation kit at home. Ask your provider if this is an option for you. Use the kit only as told by your provider. Read the instructions on the  package. Follow the directions for using the syringe. Use water that's at room temperature. Do not use an ear irrigation kit if: You have diabetes. This can make you more likely to get an infection. You have a hole or tear in your eardrum. You have tubes in your ears. You've had ear surgery before. You've been told not to irrigate your ears. Cleaning your ears  Clean the outside of your ear with a soft washcloth each day. If told by your provider, use a few drops of baby oil, mineral oil, glycerin, hydrogen peroxide, or earwax softening drops. Do not use cotton swabs to clean your ears. These can push wax down into the ear canal. Do not put things into your ears to try to get rid of wax. This includes ear candles. General instructions Take over-the-counter and prescription medicines only as told by your provider. If you were prescribed antibiotics, use them as told by your provider. Do not stop using the antibiotic even if you start to feel better. Keep your ear clean and dry as told by your provider. See your provider at least once a year to have your ears and hearing checked. Contact a health care provider if: Your hearing isn't getting better. Your hearing is getting worse. You have pain or redness in your ear. You feel dizzy. You have ringing in your ears. You have nausea or vomiting. This information is not intended to replace advice given to you by your health care provider. Make sure you discuss any questions you have with  your health care provider. Document Revised: 02/26/2023 Document Reviewed: 02/26/2023 Elsevier Patient Education  2024 [employer].

## 2024-02-23 NOTE — Progress Notes (Signed)
 Subjective:  Patient ID: Nancy Vasquez, female    DOB: [birth year]  Age: 83 y.o. MRN: [id number]  CC: Medical Management of Chronic Issues (Knee pain/Ear soreness/Knots on neck)   HPI Nancy Vasquez is a 83 y.o. year old female with a history of CAD (followed by Dr Sharyn Lull), TIA , left knee osteoarthritis (s/p left TKR),  right THR, hyperlipidemia, hypertension .    Discussed the use of AI scribe software for clinical note transcription with the patient, who gave verbal consent to proceed.  History of Present Illness The patient, with a history of heart disease and arthritis, presents with worsening right knee pain. She is considering surgery and has an upcoming appointment at [facility] for a second opinion. She previously participated in water therapy, but discontinued it due to lack of perceived benefit and advice from an unknown source. She has been doing exercises found on Facebook at home for her knee.  She also reports intermittent left ear pain and is concerned because in the past she had left ear pain which she associates with a previous hospital admission for sepsis. She has not had a fever recently. She has considered home remedies for the ear pain, such as warm oil and cotton.  The patient also mentions a nodule beneath her left ear, which has been present for some time. It has been suggested that she have it removed, but she has not pursued this. The nodule does not cause pain unless pressure is applied.  She also reports constipation, which has improved with dietary changes. She has a history of impaction, but has learned to manage this.    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   Folliculitis 04/10/2023    Past Surgical History:  Procedure Laterality Date   ABDOMINAL HYSTERECTOMY     BREAST EXCISIONAL BIOPSY Left    CORONARY PRESSURE/FFR STUDY  N/A 03/17/2017   Procedure: Intravascular Pressure Wire/FFR Study;  Surgeon: Rinaldo Cloud, MD;  Location: [facility] INVASIVE CV LAB;  Service: Cardiovascular;  Laterality: N/A;  mid LAD   LEFT HEART CATH AND CORONARY ANGIOGRAPHY N/A 03/17/2017   Procedure: Left Heart Cath and Coronary Angiography;  Surgeon: Rinaldo Cloud, MD;  Location: [facility] INVASIVE CV LAB;  Service: Cardiovascular;  Laterality: N/A;   TONSILLECTOMY     Removed as a child   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   Procedure: LEFT TOTAL KNEE ARTHROPLASTY;  Surgeon: Cammy Copa, MD;  Location: [facility];  Service: Orthopedics;  Laterality: Left;    Family History  Problem Relation Age of Onset   Breast cancer Sister 41       metastatic breast ca    Social History   Socioeconomic History   Marital status: Married    Spouse name: Not on file   Number of children: Not on file   Years of education: Not on file   Highest education level: Not on file  Occupational History   Not on file  Tobacco Use   Smoking status: Never   Smokeless tobacco: Never  Vaping Use   Vaping status: Never Used  Substance and Sexual Activity   Alcohol use: Never   Drug use: Never   Sexual activity: Yes  Other Topics Concern   Not on file  Social History Narrative   Not on file   Social Drivers of Corporate investment banker  Strain: Low Risk  (07/22/2023)   Overall Financial Resource Strain (CARDIA)    Difficulty of Paying Living Expenses: Not hard at all  Food Insecurity: No Food Insecurity (07/22/2023)   Hunger Vital Sign    Worried About Running Out of Food in the Last Year: Never true    Ran Out of Food in the Last Year: Never true  Transportation Needs: No Transportation Needs (07/22/2023)   PRAPARE - Administrator, Civil Service (Medical): No    Lack of Transportation (Non-Medical): No  Physical Activity: Insufficiently Active (07/22/2023)   Exercise Vital Sign    Days of Exercise per Week: 3 days    Minutes of  Exercise per Session: 30 min  Stress: No Stress Concern Present (07/22/2023)   [employer] of Occupational Health - Occupational Stress Questionnaire    Feeling of Stress : Not at all  Social Connections: Socially Integrated (07/22/2023)   Social Connection and Isolation Panel [NHANES]    Frequency of Communication with Friends and Family: More than three times a week    Frequency of Social Gatherings with Friends and Family: Three times a week    Attends Religious Services: More than 4 times per year    Active Member of Clubs or Organizations: Yes    Attends Banker Meetings: 1 to 4 times per year    Marital Status: Married    Allergies  Allergen Reactions   Ibuprofen Nausea And Vomiting and Other (See Comments)    "burns stomach"   Oxycodone Nausea Only    "Burns stomach"    Outpatient Medications Prior to Visit  Medication Sig Dispense Refill   atorvastatin (LIPITOR) 20 MG tablet Take 1 tablet (20 mg total) by mouth daily. 90 tablet 3   clopidogrel (PLAVIX) 75 MG tablet Take 1 tablet (75 mg total) by mouth daily. 90 tablet 1   clopidogrel (PLAVIX) 75 MG tablet Take 1 tablet (75 mg total) by mouth daily. 90 tablet 3   ergocalciferol (VITAMIN D2) 1.25 MG (50000 UT) capsule Take 1 capsule (50,000 Units total) by mouth once a week. 12 capsule 3   nitroGLYCERIN (NITROSTAT) 0.4 MG SL tablet Place under the tongue at the first sign of attack. Repeat every 5 minutes up to 3 tabs if no relief see medical help. 25 tablet 3   predniSONE (DELTASONE) 5 MG tablet TAKE AS DIRECTED ON PAPER 21 tablet 0   diclofenac Sodium (VOLTAREN ARTHRITIS PAIN) 1 % GEL Apply 4 g topically 4 (four) times daily. (Patient not taking: Reported on 02/23/2024) 200 g 1   predniSONE (STERAPRED UNI-PAK 21 TAB) 5 MG (21) TBPK tablet follow package directions (Patient not taking: Reported on 02/23/2024) 21 tablet 0   Vitamin D, Ergocalciferol, (DRISDOL) 1.25 MG (50000 UNIT) CAPS capsule Take 1 capsule  (50,000 Units total) by mouth once a week. (Patient not taking: Reported on 02/23/2024) 12 capsule 1   No facility-administered medications prior to visit.     ROS Review of Systems  Constitutional:  Negative for activity change and appetite change.  HENT:  Positive for ear pain. Negative for sinus pressure and sore throat.   Respiratory:  Negative for chest tightness, shortness of breath and wheezing.   Cardiovascular:  Negative for chest pain and palpitations.  Gastrointestinal:  Negative for abdominal distention, abdominal pain and constipation.  Genitourinary: Negative.   Musculoskeletal:        See HPI  Psychiatric/Behavioral:  Positive for behavioral problems. Negative for dysphoric mood.  Objective:  BP 123/74   Pulse 63   Ht 5\' 4"  (1.626 m)   Wt 155 lb 12.8 oz (70.7 kg)   SpO2 99%   BMI 26.74 kg/m      02/23/2024    2:44 PM 01/25/2024    6:15 PM 01/25/2024    4:25 PM  BP/Weight  Systolic BP 123 129 122  Diastolic BP 74 85 57  Wt. (Lbs) 155.8    BMI 26.74 kg/m2        Physical Exam Constitutional:      Appearance: She is well-developed.  HENT:     Right Ear: There is impacted cerumen.     Left Ear: There is impacted cerumen.     Ears:     Comments: Left infra auricular mass attached to superficial and underlying structures, soft, non tender Cardiovascular:     Rate and Rhythm: Normal rate.     Heart sounds: Normal heart sounds. No murmur heard. Pulmonary:     Effort: Pulmonary effort is normal.     Breath sounds: Normal breath sounds. No wheezing or rales.  Chest:     Chest wall: No tenderness.  Abdominal:     General: Bowel sounds are normal. There is no distension.     Palpations: Abdomen is soft. There is no mass.     Tenderness: There is no abdominal tenderness.  Musculoskeletal:     Right lower leg: No edema.     Left lower leg: No edema.     Comments: Tenderness on range of motion of both knees  Neurological:     Mental Status: She is  alert and oriented to person, place, and time.  Psychiatric:        Mood and Affect: Mood normal.        Latest Ref Rng & Units 11/17/2023    4:48 PM 07/14/2023    3:22 PM 07/11/2023    6:21 AM  CMP  Glucose 70 - 99 mg/dL 91  84  89   BUN 8 - 27 mg/dL 8  8  7    Creatinine 0.57 - 1.00 mg/dL 1.61  0.96  0.45   Sodium 134 - 144 mmol/L 143  143  137   Potassium 3.5 - 5.2 mmol/L 4.1  3.8  3.7   Chloride 96 - 106 mmol/L 105  106  104   CO2 20 - 29 mmol/L 23  24  23    Calcium 8.7 - 10.3 mg/dL 40.9  9.6  8.7   Total Protein 6.0 - 8.5 g/dL  7.1    Total Bilirubin 0.0 - 1.2 mg/dL  0.4    Alkaline Phos 44 - 121 IU/L  68    AST 0 - 40 IU/L  29    ALT 0 - 32 IU/L  26      Lipid Panel     Component Value Date/Time   CHOL 152 09/28/2022 0525   TRIG 40 09/28/2022 0525   HDL 68 09/28/2022 0525   CHOLHDL 2.2 09/28/2022 0525   VLDL 8 09/28/2022 0525   LDLCALC 76 09/28/2022 0525    CBC    Component Value Date/Time   WBC 6.2 11/17/2023 1648   WBC 9.5 07/11/2023 0621   RBC 4.17 11/17/2023 1648   RBC 3.54 (L) 07/11/2023 0621   HGB 12.5 11/17/2023 1648   HCT 39.5 11/17/2023 1648   PLT 300 11/17/2023 1648   MCV 95 11/17/2023 1648   MCH 30.0 11/17/2023 1648   MCH 30.5  07/11/2023 0621   MCHC 31.6 11/17/2023 1648   MCHC 32.4 07/11/2023 0621   RDW 11.8 11/17/2023 1648   LYMPHSABS 2.2 11/17/2023 1648   MONOABS 1.3 (H) 07/09/2023 1653   EOSABS 0.2 11/17/2023 1648   BASOSABS 0.0 11/17/2023 1648    Lab Results  Component Value Date   HGBA1C 4.7 (L) 09/28/2022    Assessment & Plan:   Assessment & Plan Knee Pain Severe right knee pain, worse than left. Patient has stopped water therapy due to perceived lack of benefit and is currently doing exercises at home. Patient is seeking a second opinion from [facility]. -Consider use of knee sleeve for support. -Consider turmeric capsules or gummies for inflammation. -Consider steroid injection for inflammation defer to [facility].  Left Ear Pain Recurrent left ear pain, previously associated with sepsis. Examination revealed significant ear wax. -Perform ear cleaning in office. -Recommend use of peroxide drops at home for ear wax management.  Subcutaneous Nodule Patient has a palpable lump beneath her left earlobe, possibly a lipoma or enlarged lymph node. No associated pain. -Order ultrasound to determine nature of the nodule.  Constipation Patient has a history of constipation, currently managed with dietary changes. -Continue current management.  CAD Stable -Secondary risk factor modification -Management as per cardiology   Follow-up Re-evaluate after patient's visit to [facility] and after ultrasound results are available.      No orders of the defined types were placed in this encounter.   Follow-up: No follow-ups on file.       Hoy Register, MD, FAAFP. [facility] [place], [place] [phone]   02/23/2024, 3:29 PM

## 2024-02-24 ENCOUNTER — Ambulatory Visit: Payer: Medicare Other | Attending: Family Medicine

## 2024-02-24 DIAGNOSIS — R220 Localized swelling, mass and lump, head: Secondary | ICD-10-CM

## 2024-02-24 DIAGNOSIS — I2510 Atherosclerotic heart disease of native coronary artery without angina pectoris: Secondary | ICD-10-CM

## 2024-02-24 DIAGNOSIS — Z131 Encounter for screening for diabetes mellitus: Secondary | ICD-10-CM | POA: Diagnosis not present

## 2024-02-25 ENCOUNTER — Other Ambulatory Visit: Payer: Self-pay | Admitting: Family Medicine

## 2024-02-25 ENCOUNTER — Encounter: Payer: Self-pay | Admitting: Family Medicine

## 2024-02-25 DIAGNOSIS — Z780 Asymptomatic menopausal state: Secondary | ICD-10-CM

## 2024-02-25 LAB — CMP14+EGFR
ALT: 9 [IU]/L (ref 0–32)
AST: 10 [IU]/L (ref 0–40)
Albumin: 4.4 g/dL (ref 3.8–4.8)
Alkaline Phosphatase: 63 [IU]/L (ref 44–121)
BUN/Creatinine Ratio: 14 (ref 12–28)
BUN: 11 mg/dL (ref 8–27)
Bilirubin Total: 0.4 mg/dL (ref 0.0–1.2)
CO2: 22 mmol/L (ref 20–29)
Calcium: 10 mg/dL (ref 8.7–10.3)
Chloride: 108 mmol/L — ABNORMAL HIGH (ref 96–106)
Creatinine, Ser: 0.81 mg/dL (ref 0.57–1.00)
Globulin, Total: 2.5 g/dL (ref 1.5–4.5)
Glucose: 89 mg/dL (ref 70–99)
Potassium: 4.5 mmol/L (ref 3.5–5.2)
Sodium: 145 mmol/L — ABNORMAL HIGH (ref 134–144)
Total Protein: 6.9 g/dL (ref 6.0–8.5)
eGFR: 73 mL/min/{1.73_m2} (ref >59)

## 2024-02-25 LAB — CBC WITH DIFFERENTIAL/PLATELET
Basophils Absolute: 0 10*3/uL (ref 0.0–0.2)
Basos: 0 %
EOS (ABSOLUTE): 0.2 10*3/uL (ref 0.0–0.4)
Eos: 3 %
Hematocrit: 39.6 % (ref 34.0–46.6)
Hemoglobin: 12.8 g/dL (ref 11.1–15.9)
Immature Grans (Abs): 0 10*3/uL (ref 0.0–0.1)
Immature Granulocytes: 0 %
Lymphocytes Absolute: 2.2 10*3/uL (ref 0.7–3.1)
Lymphs: 38 %
MCH: 30.6 pg (ref 26.6–33.0)
MCHC: 32.3 g/dL (ref 31.5–35.7)
MCV: 95 fL (ref 79–97)
Monocytes Absolute: 0.5 10*3/uL (ref 0.1–0.9)
Monocytes: 8 %
Neutrophils Absolute: 2.9 10*3/uL (ref 1.4–7.0)
Neutrophils: 51 %
Platelets: 281 10*3/uL (ref 150–450)
RBC: 4.18 x10E6/uL (ref 3.77–5.28)
RDW: 11.8 % (ref 11.7–15.4)
WBC: 5.7 10*3/uL (ref 3.4–10.8)

## 2024-02-25 LAB — HEMOGLOBIN A1C
Est. average glucose Bld gHb Est-mCnc: 97 mg/dL
Hgb A1c MFr Bld: 5 % (ref 4.8–5.6)

## 2024-02-25 LAB — LP+NON-HDL CHOLESTEROL
Cholesterol, Total: 173 mg/dL (ref 100–199)
HDL: 78 mg/dL (ref >39)
LDL Chol Calc (NIH): 79 mg/dL (ref 0–99)
Total Non-HDL-Chol (LDL+VLDL): 95 mg/dL (ref 0–129)
Triglycerides: 87 mg/dL (ref 0–149)
VLDL Cholesterol Cal: 16 mg/dL (ref 5–40)

## 2024-02-29 ENCOUNTER — Other Ambulatory Visit (HOSPITAL_COMMUNITY): Payer: Self-pay

## 2024-02-29 ENCOUNTER — Other Ambulatory Visit: Payer: Self-pay

## 2024-03-01 ENCOUNTER — Ambulatory Visit
Admission: RE | Admit: 2024-03-01 | Discharge: 2024-03-01 | Disposition: A | Discharge status: Home / Self Care | Payer: Medicare Other | Source: Ambulatory Visit | Attending: Family Medicine | Admitting: Family Medicine

## 2024-03-01 DIAGNOSIS — R220 Localized swelling, mass and lump, head: Secondary | ICD-10-CM

## 2024-03-07 ENCOUNTER — Encounter: Payer: Self-pay | Admitting: Family Medicine

## 2024-03-07 ENCOUNTER — Other Ambulatory Visit: Payer: Self-pay | Admitting: Family Medicine

## 2024-03-07 DIAGNOSIS — D119 Benign neoplasm of major salivary gland, unspecified: Secondary | ICD-10-CM

## 2024-03-16 DIAGNOSIS — M25561 Pain in right knee: Secondary | ICD-10-CM | POA: Diagnosis not present

## 2024-03-16 DIAGNOSIS — Z96652 Presence of left artificial knee joint: Secondary | ICD-10-CM | POA: Diagnosis not present

## 2024-03-16 DIAGNOSIS — M1711 Unilateral primary osteoarthritis, right knee: Secondary | ICD-10-CM | POA: Diagnosis not present

## 2024-03-16 DIAGNOSIS — M25562 Pain in left knee: Secondary | ICD-10-CM | POA: Diagnosis not present

## 2024-04-04 DIAGNOSIS — K118 Other diseases of salivary glands: Secondary | ICD-10-CM | POA: Diagnosis not present

## 2024-04-08 DIAGNOSIS — E782 Mixed hyperlipidemia: Secondary | ICD-10-CM | POA: Diagnosis not present

## 2024-04-08 DIAGNOSIS — I10 Essential (primary) hypertension: Secondary | ICD-10-CM | POA: Diagnosis not present

## 2024-04-08 DIAGNOSIS — I2510 Atherosclerotic heart disease of native coronary artery without angina pectoris: Secondary | ICD-10-CM | POA: Diagnosis not present

## 2024-04-08 DIAGNOSIS — M150 Primary generalized (osteo)arthritis: Secondary | ICD-10-CM | POA: Diagnosis not present

## 2024-06-01 ENCOUNTER — Inpatient Hospital Stay: Admission: RE | Admit: 2024-06-01 | Source: Ambulatory Visit

## 2024-06-16 ENCOUNTER — Other Ambulatory Visit (INDEPENDENT_AMBULATORY_CARE_PROVIDER_SITE_OTHER): Payer: Self-pay

## 2024-06-16 ENCOUNTER — Ambulatory Visit (INDEPENDENT_AMBULATORY_CARE_PROVIDER_SITE_OTHER): Admitting: Surgical

## 2024-06-16 ENCOUNTER — Other Ambulatory Visit: Payer: Self-pay

## 2024-06-16 DIAGNOSIS — M1612 Unilateral primary osteoarthritis, left hip: Secondary | ICD-10-CM | POA: Diagnosis not present

## 2024-06-16 DIAGNOSIS — M25552 Pain in left hip: Secondary | ICD-10-CM | POA: Diagnosis not present

## 2024-06-16 DIAGNOSIS — M5410 Radiculopathy, site unspecified: Secondary | ICD-10-CM

## 2024-06-19 ENCOUNTER — Encounter: Payer: Self-pay | Admitting: Surgical

## 2024-06-19 MED ORDER — LIDOCAINE HCL 1 % IJ SOLN
3.0000 mL | INTRAMUSCULAR | Status: AC | PRN
  Administered 2024-06-16: 3 mL

## 2024-06-19 MED ORDER — BUPIVACAINE HCL 0.25 % IJ SOLN
3.0000 mL | INTRAMUSCULAR | Status: AC | PRN
  Administered 2024-06-16: 3 mL via INTRA_ARTICULAR

## 2024-06-19 MED ORDER — TRIAMCINOLONE ACETONIDE 40 MG/ML IJ SUSP
40.0000 mg | INTRAMUSCULAR | Status: AC | PRN
  Administered 2024-06-16: 40 mg via INTRA_ARTICULAR

## 2024-06-19 NOTE — Progress Notes (Signed)
 Office Visit Note   Patient: Nancy Vasquez           Date of Birth: 07-Jan-1941           MRN: [id number] Visit Date: 06/16/2024 Requested by: Delbert Clam, MD [address] [place],  [place] [address] PCP: Delbert Clam, MD  Subjective: Chief Complaint  Patient presents with   Left Leg - Pain    HPI: Nancy Vasquez is a 83 y.o. female who presents to the office reporting left knee and leg pain.  She has history of prior left total knee arthroplasty that was done about 4 years ago by Dr. Addie.  She states that she has had continued stiffness and pain in the left leg since this procedure.  She describes burning sensation with radiating leg pain that extends from the lateral hip and buttock down to the ankle at times.  She has associated low back pain as well as groin pain.  When her knee hurts it is primarily in the lateral aspect of the knee as well as around the quad tendon.  She has tried topical medications, cold, heat, Tylenol  without much relief.  She has been evaluated for this postop pain in the past as well with previous radiographs demonstrating no significant abnormality in regards to her left knee.  She denies any fevers or chills.  The knee does not swell.  She has no significant pain with knee range of motion..                ROS: All systems reviewed are negative as they relate to the chief complaint within the history of present illness.  Patient denies fevers or chills.  Assessment & Plan: Visit Diagnoses:  1. Radicular pain of left lower extremity   2. Pain in left hip   3. Arthritis of left hip     Plan: Patient is a 83 year old female who presents for evaluation of left leg pain.  Has had chronic left leg pain that has continued to bother her to some degree since her knee replacement about 4 years ago.  A lot of her knee pain got better after knee replacement but she has had continued pain with pain that extends from the buttock and groin down to  the ankle at times.  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  She also has decreased motion of her left hip today so radiographs were taken of her pelvis demonstrating end-stage hip arthritis.  She may have contribution from both of these pathologies but based on her knee exam and previous radiographs that have been taken multiple times, do not see any significant need for further workup of her knee pain.  We discussed options available to patient.  She would like to try left hip intra-articular injection.  This was administered under ultrasound guidance and patient tolerated procedure well without complication.  She had about 60% relief of her symptoms while walking out of the clinic.  She will let us  know how this does for her and when this wears off, we can see if she wants to discuss hip replacement versus another injection into the hip versus lumbar spine ESI.  Follow-up as needed.  Follow-Up Instructions: No follow-ups on file.   Orders:  Orders Placed This Encounter  Procedures   XR HIP UNILAT W OR W/O PELVIS 2-3 VIEWS LEFT  US  Guided Needle Placement - No Linked Charges   No orders of the defined types were placed in this encounter.     Procedures: Large Joint Inj: L hip joint on 06/16/2024 5:25 PM Indications: pain and diagnostic evaluation Details: 22 G 3.5 in needle, ultrasound-guided anterior approach Medications: 3 mL bupivacaine  0.25 %; 40 mg triamcinolone  acetonide 40 MG/ML; 3 mL lidocaine  1 % Outcome: tolerated well, no immediate complications Procedure, treatment alternatives, risks and benefits explained, specific risks discussed. Consent was given by the patient. Immediately prior to procedure a time out was called to verify the correct patient, procedure, equipment, support staff and site/side marked as required. Patient was prepped and draped in the  usual sterile fashion.       Clinical Data: No additional findings.  Objective: Vital Signs: There were no vitals taken for this visit.  Physical Exam:  Constitutional: Patient appears well-developed HEENT:  Head: Normocephalic Eyes:EOM are normal Neck: Normal range of motion Cardiovascular: Normal rate Pulmonary/chest: Effort normal Neurologic: Patient is alert Skin: Skin is warm Psychiatric: Patient has normal mood and affect  Ortho Exam: Ortho exam demonstrates left knee with well-healed incision.  No effusion.  No warmth over the left knee.  She has range of motion from 0 degrees extension to easily greater than 100 degrees of knee flexion without any significant pain except at terminal flexion.  This is only mild discomfort.  No sinus tract noted.  She does have increased pain with hip range of motion as well as the lack of passive internal rotation of the left hip compared with the right.  She has had right hip replaced and has about 30 to 40 degrees of internal rotation of that hip relative to the left hip which has about 10 degrees of internal rotation.  This does reproduce some of her left leg pain.  She has intact hip flexion, quadricep, hamstring, dorsiflexion, plantarflexion strength rated 5/5.  Intact EHL.  Palpable DP pulse of the left lower extremity.  Negative straight leg raise of the left lower extremity and right lower extremity.  Negative clonus noted bilaterally.  Specialty Comments:  No specialty comments available.  Imaging: No results found.   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   Folliculitis 04/10/2023    Family History  Problem Relation Age of Onset   Breast cancer Sister 68       metastatic breast ca    Past Surgical History:  Procedure Laterality Date   ABDOMINAL HYSTERECTOMY     BREAST EXCISIONAL BIOPSY Left    CORONARY PRESSURE/FFR STUDY N/A 03/17/2017   Procedure: Intravascular Pressure Wire/FFR Study;  Surgeon: Rober Chroman, MD;  Location[facility] INVASIVE CV LAB;  Service: Cardiovascular;  Laterality: N/A;  mid LAD   LEFT HEART CATH AND CORONARY ANGIOGRAPHY N/A 03/17/2017   Procedure: Left Heart Cath and Coronary Angiography;  Surgeon: Rober Chroman, MD;  Location: [facility] INVASIVE CV LAB;  Service: Cardiovascular;  Laterality: N/A;   TONSILLECTOMY     Removed as a child   TOTAL KNEE  ARTHROPLASTY Left 04/19/2020   Procedure: LEFT TOTAL KNEE ARTHROPLASTY;  Surgeon: Addie Cordella Hamilton, MD;  Location: [facility] OR;  Service: Orthopedics;  Laterality: Left;   Social History   Occupational History   Not on file  Tobacco Use   Smoking status: Never   Smokeless tobacco: Never  Vaping Use   Vaping status: Never Used  Substance and Sexual Activity   Alcohol use: Never   Drug use: Never   Sexual activity: Yes

## 2024-06-22 DIAGNOSIS — H524 Presbyopia: Secondary | ICD-10-CM | POA: Diagnosis not present

## 2024-06-23 ENCOUNTER — Other Ambulatory Visit: Payer: Self-pay

## 2024-07-05 ENCOUNTER — Other Ambulatory Visit: Payer: Self-pay

## 2024-07-05 DIAGNOSIS — E782 Mixed hyperlipidemia: Secondary | ICD-10-CM | POA: Diagnosis not present

## 2024-07-05 DIAGNOSIS — M150 Primary generalized (osteo)arthritis: Secondary | ICD-10-CM | POA: Diagnosis not present

## 2024-07-05 DIAGNOSIS — I2510 Atherosclerotic heart disease of native coronary artery without angina pectoris: Secondary | ICD-10-CM | POA: Diagnosis not present

## 2024-07-05 DIAGNOSIS — I10 Essential (primary) hypertension: Secondary | ICD-10-CM | POA: Diagnosis not present

## 2024-07-05 MED ORDER — ROSUVASTATIN CALCIUM 10 MG PO TABS
10.0000 mg | ORAL_TABLET | Freq: Every day | ORAL | 3 refills | Status: AC
  Filled 2024-07-05 – 2024-10-05 (×2): qty 90, 90d supply, fill #0
  Filled 2024-10-05 – 2024-12-30 (×2): qty 90, 90d supply, fill #1
  Filled 2025-02-03: qty 30, 30d supply, fill #2

## 2024-07-12 ENCOUNTER — Ambulatory Visit: Admitting: Neurology

## 2024-07-20 ENCOUNTER — Other Ambulatory Visit: Payer: Self-pay

## 2024-07-20 ENCOUNTER — Other Ambulatory Visit (HOSPITAL_BASED_OUTPATIENT_CLINIC_OR_DEPARTMENT_OTHER): Payer: Self-pay | Admitting: Pharmacist

## 2024-07-20 DIAGNOSIS — E785 Hyperlipidemia, unspecified: Secondary | ICD-10-CM

## 2024-07-20 NOTE — Progress Notes (Signed)
 Pharmacy Quality Measure Review  This patient is appearing on a report for being at risk of failing the adherence measure for cholesterol (statin) medications this calendar year.   Medication: rosuvastatin   Last fill date: 07/05/2024 for 90 day supply  Contacted pharmacy to facilitate refills. Insurance report stated she was on atorvastatin  but she has been changed to rosuvastatin  with recent 90-day supply filled.   Herlene Fleeta Morris, PharmD, JAQUELINE, CPP Clinical Pharmacist [facility] [phone]

## 2024-07-21 DIAGNOSIS — I10 Essential (primary) hypertension: Secondary | ICD-10-CM | POA: Diagnosis not present

## 2024-07-21 DIAGNOSIS — E785 Hyperlipidemia, unspecified: Secondary | ICD-10-CM | POA: Diagnosis not present

## 2024-07-26 ENCOUNTER — Ambulatory Visit: Payer: Medicare Other | Attending: Family Medicine

## 2024-07-26 ENCOUNTER — Other Ambulatory Visit: Payer: Self-pay

## 2024-07-26 VITALS — Ht 64.0 in | Wt 151.0 lb

## 2024-07-26 DIAGNOSIS — Z0000 Encounter for general adult medical examination without abnormal findings: Secondary | ICD-10-CM | POA: Diagnosis not present

## 2024-07-26 NOTE — Progress Notes (Signed)
 Because this visit was a virtual/telehealth visit,  certain criteria was not obtained, such a blood pressure, CBG if applicable, and timed get up and go. Any medications not marked as taking were not mentioned during the medication reconciliation part of the visit. Any vitals not documented were not able to be obtained due to this being a telehealth visit or patient was unable to self-report a recent blood pressure reading due to a lack of equipment at home via telehealth. Vitals that have been documented are verbally provided by the patient.   Subjective:   Nancy Vasquez is a 83 y.o. who presents for a Medicare Wellness preventive visit.  As a reminder, Annual Wellness Visits don't include a physical exam, and some assessments may be limited, especially if this visit is performed virtually. We may recommend an in-person follow-up visit with your provider if needed.  Visit Complete: Virtual I connected with  Nancy Vasquez on 07/26/24 by a audio enabled telemedicine application and verified that I am speaking with the correct person using two identifiers.  Patient Location: Other:  [facility]  Provider Location: Home Office  I discussed the limitations of evaluation and management by telemedicine. The patient expressed understanding and agreed to proceed.  Vital Signs: Because this visit was a virtual/telehealth visit, some criteria may be missing or patient reported. Any vitals not documented were not able to be obtained and vitals that have been documented are patient reported.  VideoDeclined- This patient declined Librarian, academic. Therefore the visit was completed with audio only.  Persons Participating in Visit: Patient.  AWV Questionnaire: No: Patient Medicare AWV questionnaire was not completed prior to this visit.  Cardiac Risk Factors include: advanced age (>46men, >46 women);sedentary lifestyle;dyslipidemia     Objective:    Today's Vitals    07/26/24 1006  Weight: 151 lb (68.5 kg)  Height: 5' 4 (1.626 m)  PainSc: 5   PainLoc: Leg   Body mass index is 25.92 kg/m.     07/26/2024   10:09 AM 08/19/2023   11:14 AM 07/22/2023    7:15 PM 07/09/2023    8:56 PM 05/26/2023    3:58 PM 04/23/2023   10:49 AM 04/09/2023    1:00 PM  Advanced Directives  Does Patient Have a Medical Advance Directive? No Yes No No No No No  Type of Furniture conservator/restorer;Living will       Copy of Healthcare Power of Attorney in Chart?  No - copy requested       Would patient like information on creating a medical advance directive? No - Patient declined No - Patient declined Yes (MAU/Ambulatory/Procedural Areas - Information given) No - Patient declined No - Patient declined No - Patient declined Yes (Inpatient - patient requests chaplain consult to create a medical advance directive)    Current Medications (verified) Outpatient Encounter Medications as of 07/26/2024  Medication Sig   clopidogrel  (PLAVIX ) 75 MG tablet Take 1 tablet (75 mg total) by mouth daily.   clopidogrel  (PLAVIX ) 75 MG tablet Take 1 tablet (75 mg total) by mouth daily.   diclofenac  Sodium (VOLTAREN  ARTHRITIS PAIN) 1 % GEL Apply 4 g topically 4 (four) times daily. (Patient not taking: Reported on 02/23/2024)   ergocalciferol  (VITAMIN D2) 1.25 MG (50000 UT) capsule Take 1 capsule (50,000 Units total) by mouth once a week.   nitroGLYCERIN  (NITROSTAT ) 0.4 MG SL tablet Place under the tongue at the first sign of attack. Repeat every 5  minutes up to 3 tabs if no relief see medical help.   predniSONE  (DELTASONE ) 5 MG tablet TAKE AS DIRECTED ON PAPER   rosuvastatin  (CRESTOR ) 10 MG tablet Take 1 tablet (10 mg total) by mouth daily.   Vitamin D , Ergocalciferol , (DRISDOL ) 1.25 MG (50000 UNIT) CAPS capsule Take 1 capsule (50,000 Units total) by mouth once a week. (Patient not taking: Reported on 02/23/2024)   No facility-administered encounter medications on file as of  07/26/2024.    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   Folliculitis 04/10/2023   Past Surgical History:  Procedure Laterality Date   ABDOMINAL HYSTERECTOMY     BREAST EXCISIONAL BIOPSY Left    CORONARY PRESSURE/FFR STUDY N/A 03/17/2017   Procedure: Intravascular Pressure Wire/FFR Study;  Surgeon: Rober Chroman, MD;  Location: [facility] INVASIVE CV LAB;  Service: Cardiovascular;  Laterality: N/A;  mid LAD   LEFT HEART CATH AND CORONARY ANGIOGRAPHY N/A 03/17/2017   Procedure: Left Heart Cath and Coronary Angiography;  Surgeon: Rober Chroman, MD;  Location: [facility] INVASIVE CV LAB;  Service: Cardiovascular;  Laterality: N/A;   TONSILLECTOMY     Removed as a child   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   Procedure: LEFT TOTAL KNEE ARTHROPLASTY;  Surgeon: Addie Cordella Hamilton, MD;  Location[facility] OR;  Service: Orthopedics;  Laterality: Left;   Family History  Problem Relation Age of Onset   Breast cancer Sister 54       metastatic breast ca   Social History   Socioeconomic History   Marital status: Married    Spouse name: Not on file   Number of children: Not on file   Years of education: Not on file   Highest education level: Not on file  Occupational History   Not on file  Tobacco Use   Smoking status: Never   Smokeless tobacco: Never  Vaping Use   Vaping status: Never Used  Substance and Sexual Activity   Alcohol use: Never   Drug use: Never   Sexual activity: Yes  Other Topics Concern   Not on file  Social History Narrative   Not on file   Social Drivers of Health   Financial Resource Strain: Low Risk  (07/26/2024)   Overall Financial Resource Strain (CARDIA)    Difficulty of Paying Living Expenses: Not hard at all  Food Insecurity: No Food Insecurity (07/26/2024)   Hunger Vital Sign    Worried About  Running Out of Food in the Last Year: Never true    Ran Out of Food in the Last Year: Never true  Transportation Needs: No Transportation Needs (07/26/2024)   PRAPARE - Administrator, Civil Service (Medical): No    Lack of Transportation (Non-Medical): No  Physical Activity: Insufficiently Active (07/26/2024)   Exercise Vital Sign    Days of Exercise per Week: 3 days    Minutes of Exercise per Session: 30 min  Stress: No Stress Concern Present (07/26/2024)   [employer] of Occupational Health - Occupational Stress Questionnaire    Feeling of Stress: Not at all  Social Connections: Socially Integrated (07/26/2024)   Social Connection and Isolation Panel    Frequency of Communication with Friends and Family: More than three times a week    Frequency of Social Gatherings with Friends and Family: Three times a week  Attends Religious Services: More than 4 times per year    Active Member of Clubs or Organizations: Yes    Attends Banker Meetings: 1 to 4 times per year    Marital Status: Married    Tobacco Counseling Counseling given: Not Answered    Clinical Intake:  Pre-visit preparation completed: Yes  Pain : 0-10 Pain Score: 5  Pain Type: Neuropathic pain Pain Location: Leg (LEFT KNEE) Pain Orientation: Left     BMI - recorded: 25.92 Nutritional Status: BMI 25 -29 Overweight Nutritional Risks: None Diabetes: No  Lab Results  Component Value Date   HGBA1C 5.0 02/24/2024   HGBA1C 4.7 (L) 09/28/2022     How often do you need to have someone help you when you read instructions, pamphlets, or other written materials from your doctor or pharmacy?: 1 - Never  Interpreter Needed?: No  Information entered by :: Roz Fuller, LPN.   Activities of Daily Living     07/26/2024   10:09 AM  In your present state of health, do you have any difficulty performing the following activities:  Hearing? 0  Vision? 0  Difficulty concentrating or  making decisions? 0  Comment BSE: AVID READER, PUZZLES  Walking or climbing stairs? 1  Comment USES A CANE  Dressing or bathing? 0  Doing errands, shopping? 0  Preparing Food and eating ? N  Using the Toilet? N  In the past six months, have you accidently leaked urine? N  Do you have problems with loss of bowel control? N  Managing your Medications? N  Managing your Finances? N  Housekeeping or managing your Housekeeping? N    Patient Care Team: Delbert Clam, MD as PCP - General (Family Medicine) Claudene Muskrat, OD as Consulting Physician (Optometry)  I have updated your Care Teams any recent Medical Services you may have received from other providers in the past year.     Assessment:   This is a routine wellness examination for Nancy Vasquez.  Hearing/Vision screen Hearing Screening - Comments:: Denies hearing difficulties.   Vision Screening - Comments:: Wears rx glasses - up to date with routine eye exams with Muskrat Claudene, OD.    Goals Addressed             This Visit's Progress    07/26/24: To maintain my health and increase my physical activity.         Depression Screen     07/26/2024   10:12 AM 02/23/2024    2:53 PM 11/17/2023    3:46 PM 07/22/2023    7:16 PM 07/14/2023    3:12 PM 04/30/2023    2:32 PM 04/23/2023   10:56 AM  PHQ 2/9 Scores  PHQ - 2 Score 0 0 0 0 0 0 0  PHQ- 9 Score 0 0 0 2 2 0     Fall Risk     07/26/2024   10:09 AM 02/23/2024    2:53 PM 11/17/2023    3:46 PM 08/28/2023    2:23 PM 07/22/2023    7:13 PM  Fall Risk   Falls in the past year? 0 0 0 0 0  Number falls in past yr: 0 0 0 0 0  Injury with Fall? 0 0 0 0 0  Risk for fall due to : No Fall Risks No Fall Risks No Fall Risks Impaired mobility Impaired balance/gait;Impaired mobility  Follow up Falls evaluation completed Falls evaluation completed Falls evaluation completed Falls evaluation completed Falls prevention discussed;Education provided;Falls evaluation  completed    MEDICARE  RISK AT HOME:  Medicare Risk at Home Any stairs in or around the home?: Yes If so, are there any without handrails?: No Home free of loose throw rugs in walkways, pet beds, electrical cords, etc?: Yes Life alert?: No Use of a cane, walker or w/c?: Yes (CANE) Grab bars in the bathroom?: Yes Shower chair or bench in shower?: No Elevated toilet seat or a handicapped toilet?: Yes  TIMED UP AND GO:  Was the test performed?  No  Cognitive Function: 6CIT completed    07/26/2024   10:12 AM  MMSE - Mini Mental State Exam  Not completed: Unable to complete        07/26/2024   10:35 AM 07/22/2023    7:15 PM  6CIT Screen  What Year? 0 points 0 points  What month? 0 points 0 points  What time? 0 points 0 points  Count back from 20 0 points 0 points  Months in reverse 0 points 0 points  Repeat phrase 0 points 0 points  Total Score 0 points 0 points    Immunizations Immunization History  Administered Date(s) Administered   Fluad Quad(high Dose 65+) 11/11/2017, 09/30/2018, 09/10/2020, 10/30/2022   Fluad Trivalent(High Dose 65+) 11/17/2023   Influenza,inj,Quad PF,6+ Mos 10/07/2019   PFIZER(Purple Top)SARS-COV-2 Vaccination 02/27/2020, 03/28/2020, 10/13/2020   Pfizer Covid-19 Vaccine Bivalent Booster 27yrs & up 09/26/2021   Pneumococcal Conjugate-13 07/26/2019   Pneumococcal Polysaccharide-23 08/06/2020   Tdap 08/20/2015, 01/31/2022    Screening Tests Health Maintenance  Topic Date Due   Zoster Vaccines- Shingrix (1 of 2) Never done   COVID-19 Vaccine (5 - 2024-25 season) 08/30/2023   INFLUENZA VACCINE  07/29/2024   Medicare Annual Wellness (AWV)  07/26/2025   DTaP/Tdap/Td (3 - Td or Tdap) 02/01/2032   Pneumococcal Vaccine: 50+ Years  Completed   DEXA SCAN  Completed   Hepatitis B Vaccines  Aged Out   HPV VACCINES  Aged Out   Meningococcal B Vaccine  Aged Out   Hepatitis C Screening  Discontinued    Health Maintenance  Health Maintenance Due  Topic Date Due   Zoster  Vaccines- Shingrix (1 of 2) Never done   COVID-19 Vaccine (5 - 2024-25 season) 08/30/2023   Health Maintenance Items Addressed: Yes Patient aware of current care gaps.  Additional Screening:  Vision Screening: Recommended annual ophthalmology exams for early detection of glaucoma and other disorders of the eye. Would you like a referral to an eye doctor? No    Dental Screening: Recommended annual dental exams for proper oral hygiene  Community Resource Referral / Chronic Care Management: CRR required this visit?  No   CCM required this visit?  No   Plan:    I have personally reviewed and noted the following in the patient's chart:   Medical and social history Use of alcohol, tobacco or illicit drugs  Current medications and supplements including opioid prescriptions. Patient is not currently taking opioid prescriptions. Functional ability and status Nutritional status Physical activity Advanced directives List of other physicians Hospitalizations, surgeries, and ER visits in previous 12 months Vitals Screenings to include cognitive, depression, and falls Referrals and appointments  In addition, I have reviewed and discussed with patient certain preventive protocols, quality metrics, and best practice recommendations. A written personalized care plan for preventive services as well as general preventive health recommendations were provided to patient.   Roz LOISE Fuller, LPN   [id number]/[id number]   After Visit Summary: (MyChart) Due to this  being a telephonic visit, the after visit summary with patients personalized plan was offered to patient via MyChart   Notes: Patient aware of current care gaps.

## 2024-07-26 NOTE — Patient Instructions (Signed)
 Ms. Fikes , Thank you for taking time out of your busy schedule to complete your Annual Wellness Visit with me. I enjoyed our conversation and look forward to speaking with you again next year. I, as well as your care team,  appreciate your ongoing commitment to your health goals. Please review the following plan we discussed and let me know if I can assist you in the future. Your Game plan/ To Do List    Referrals: If you haven't heard from the office you've been referred to, please reach out to them at the phone provided.   Follow up Visits: Next Medicare AWV with our clinical staff: 08/01/2025 at 2:50 pm phone visit with Nurse Health Advisor   Have you seen your provider in the last 6 months (3 months if uncontrolled diabetes)? Yes Next Office Visit with your provider: 08/23/2024 at 2:50 pm office visit with Dr. Delbert  Clinician Recommendations:  Aim for 30 minutes of exercise or brisk walking, 6-8 glasses of water , and 5 servings of fruits and vegetables each day.       This is a list of the screening recommended for you and due dates:  Health Maintenance  Topic Date Due   Zoster (Shingles) Vaccine (1 of 2) Never done   COVID-19 Vaccine (5 - 2024-25 season) 08/30/2023   Flu Shot  07/29/2024   Medicare Annual Wellness Visit  07/26/2025   DTaP/Tdap/Td vaccine (3 - Td or Tdap) 02/01/2032   Pneumococcal Vaccine for age over 73  Completed   DEXA scan (bone density measurement)  Completed   Hepatitis B Vaccine  Aged Out   HPV Vaccine  Aged Out   Meningitis B Vaccine  Aged Out   Hepatitis C Screening  Discontinued    Advanced directives: (Declined) Advance directive discussed with you today. Even though you declined this today, please call our office should you change your mind, and we can give you the proper paperwork for you to fill out. Advance Care Planning is important because it:  [x]  Makes sure you receive the medical care that is consistent with your values, goals, and  preferences  [x]  It provides guidance to your family and loved ones and reduces their decisional burden about whether or not they are making the right decisions based on your wishes.  Follow the link provided in your after visit summary or read over the paperwork we have mailed to you to help you started getting your Advance Directives in place. If you need assistance in completing these, please reach out to us  so that we can help you!  See attachments for Preventive Care and Fall Prevention Tips.

## 2024-07-29 ENCOUNTER — Observation Stay (HOSPITAL_COMMUNITY)

## 2024-07-29 ENCOUNTER — Emergency Department (HOSPITAL_COMMUNITY)

## 2024-07-29 ENCOUNTER — Encounter (HOSPITAL_COMMUNITY): Payer: Self-pay

## 2024-07-29 ENCOUNTER — Other Ambulatory Visit: Payer: Self-pay

## 2024-07-29 ENCOUNTER — Observation Stay (HOSPITAL_COMMUNITY)
Admission: EM | Admit: 2024-07-29 | Discharge: 2024-07-30 | Disposition: A | Discharge status: Home / Self Care | Source: Ambulatory Visit | Attending: Family Medicine | Admitting: Family Medicine

## 2024-07-29 DIAGNOSIS — Z7902 Long term (current) use of antithrombotics/antiplatelets: Secondary | ICD-10-CM | POA: Diagnosis not present

## 2024-07-29 DIAGNOSIS — Z7982 Long term (current) use of aspirin: Secondary | ICD-10-CM | POA: Insufficient documentation

## 2024-07-29 DIAGNOSIS — I6782 Cerebral ischemia: Secondary | ICD-10-CM | POA: Diagnosis not present

## 2024-07-29 DIAGNOSIS — I6523 Occlusion and stenosis of bilateral carotid arteries: Secondary | ICD-10-CM | POA: Diagnosis not present

## 2024-07-29 DIAGNOSIS — G459 Transient cerebral ischemic attack, unspecified: Secondary | ICD-10-CM | POA: Diagnosis not present

## 2024-07-29 DIAGNOSIS — E785 Hyperlipidemia, unspecified: Secondary | ICD-10-CM | POA: Diagnosis not present

## 2024-07-29 DIAGNOSIS — Z8673 Personal history of transient ischemic attack (TIA), and cerebral infarction without residual deficits: Secondary | ICD-10-CM | POA: Diagnosis not present

## 2024-07-29 DIAGNOSIS — R4182 Altered mental status, unspecified: Secondary | ICD-10-CM | POA: Diagnosis not present

## 2024-07-29 DIAGNOSIS — R4701 Aphasia: Principal | ICD-10-CM | POA: Diagnosis present

## 2024-07-29 DIAGNOSIS — I2510 Atherosclerotic heart disease of native coronary artery without angina pectoris: Secondary | ICD-10-CM | POA: Diagnosis present

## 2024-07-29 DIAGNOSIS — Z96652 Presence of left artificial knee joint: Secondary | ICD-10-CM | POA: Insufficient documentation

## 2024-07-29 DIAGNOSIS — Z79899 Other long term (current) drug therapy: Secondary | ICD-10-CM | POA: Insufficient documentation

## 2024-07-29 DIAGNOSIS — D649 Anemia, unspecified: Secondary | ICD-10-CM | POA: Diagnosis not present

## 2024-07-29 DIAGNOSIS — R001 Bradycardia, unspecified: Secondary | ICD-10-CM | POA: Diagnosis not present

## 2024-07-29 DIAGNOSIS — K118 Other diseases of salivary glands: Secondary | ICD-10-CM | POA: Diagnosis not present

## 2024-07-29 LAB — CBC WITH DIFFERENTIAL/PLATELET
Abs Immature Granulocytes: 0.01 K/uL (ref 0.00–0.07)
Basophils Absolute: 0 K/uL (ref 0.0–0.1)
Basophils Relative: 0 %
Eosinophils Absolute: 0.1 K/uL (ref 0.0–0.5)
Eosinophils Relative: 2 %
HCT: 37 % (ref 36.0–46.0)
Hemoglobin: 11.6 g/dL — ABNORMAL LOW (ref 12.0–15.0)
Immature Granulocytes: 0 %
Lymphocytes Relative: 35 %
Lymphs Abs: 1.9 K/uL (ref 0.7–4.0)
MCH: 30.4 pg (ref 26.0–34.0)
MCHC: 31.4 g/dL (ref 30.0–36.0)
MCV: 97.1 fL (ref 80.0–100.0)
Monocytes Absolute: 0.5 K/uL (ref 0.1–1.0)
Monocytes Relative: 9 %
Neutro Abs: 2.9 K/uL (ref 1.7–7.7)
Neutrophils Relative %: 54 %
Platelets: 266 K/uL (ref 150–400)
RBC: 3.81 MIL/uL — ABNORMAL LOW (ref 3.87–5.11)
RDW: 12.3 % (ref 11.5–15.5)
WBC: 5.4 K/uL (ref 4.0–10.5)
nRBC: 0 % (ref 0.0–0.2)

## 2024-07-29 LAB — COMPREHENSIVE METABOLIC PANEL WITH GFR
ALT: 13 U/L (ref 0–44)
AST: 15 U/L (ref 15–41)
Albumin: 3.7 g/dL (ref 3.5–5.0)
Alkaline Phosphatase: 50 U/L (ref 38–126)
Anion gap: 7 (ref 5–15)
BUN: 8 mg/dL (ref 8–23)
CO2: 23 mmol/L (ref 22–32)
Calcium: 9 mg/dL (ref 8.9–10.3)
Chloride: 110 mmol/L (ref 98–111)
Creatinine, Ser: 0.7 mg/dL (ref 0.44–1.00)
GFR, Estimated: >60 mL/min (ref >60)
Glucose, Bld: 101 mg/dL — ABNORMAL HIGH (ref 70–99)
Potassium: 3.6 mmol/L (ref 3.5–5.1)
Sodium: 140 mmol/L (ref 135–145)
Total Bilirubin: 0.7 mg/dL (ref 0.0–1.2)
Total Protein: 6.8 g/dL (ref 6.5–8.1)

## 2024-07-29 LAB — PROTIME-INR
INR: 1.1 (ref 0.8–1.2)
Prothrombin Time: 14.6 s (ref 11.4–15.2)

## 2024-07-29 LAB — URINALYSIS, ROUTINE W REFLEX MICROSCOPIC
Bacteria, UA: NONE SEEN
Bilirubin Urine: NEGATIVE
Glucose, UA: NEGATIVE mg/dL
Hgb urine dipstick: NEGATIVE
Ketones, ur: NEGATIVE mg/dL
Nitrite: NEGATIVE
Protein, ur: NEGATIVE mg/dL
Specific Gravity, Urine: 1.02 (ref 1.005–1.030)
pH: 5 (ref 5.0–8.0)

## 2024-07-29 MED ORDER — ACETAMINOPHEN 650 MG RE SUPP: 650.0000 mg | RECTAL | Status: DC | PRN

## 2024-07-29 MED ORDER — ROSUVASTATIN CALCIUM 5 MG PO TABS
10.0000 mg | ORAL_TABLET | Freq: Every day | ORAL | Status: DC
  Administered 2024-07-29: 10 mg via ORAL
  Filled 2024-07-29: qty 2

## 2024-07-29 MED ORDER — ACETAMINOPHEN 160 MG/5ML PO SOLN: 650.0000 mg | ORAL | Status: DC | PRN

## 2024-07-29 MED ORDER — ENOXAPARIN SODIUM 40 MG/0.4ML IJ SOSY
40.0000 mg | PREFILLED_SYRINGE | INTRAMUSCULAR | Status: DC
  Administered 2024-07-29: 40 mg via SUBCUTANEOUS
  Filled 2024-07-29: qty 0.4

## 2024-07-29 MED ORDER — CLOPIDOGREL BISULFATE 75 MG PO TABS
75.0000 mg | ORAL_TABLET | Freq: Every day | ORAL | Status: DC
  Administered 2024-07-30: 75 mg via ORAL
  Filled 2024-07-29: qty 1

## 2024-07-29 MED ORDER — SENNOSIDES-DOCUSATE SODIUM 8.6-50 MG PO TABS: 1.0000 | ORAL_TABLET | Freq: Every evening | ORAL | Status: DC | PRN

## 2024-07-29 MED ORDER — ONDANSETRON HCL 4 MG/2ML IJ SOLN: 4.0000 mg | Freq: Four times a day (QID) | INTRAMUSCULAR | Status: DC | PRN

## 2024-07-29 MED ORDER — ACETAMINOPHEN 325 MG PO TABS: 650.0000 mg | ORAL_TABLET | ORAL | Status: DC | PRN

## 2024-07-29 MED ORDER — STROKE: EARLY STAGES OF RECOVERY BOOK
Freq: Once | Status: AC
  Filled 2024-07-29: qty 1

## 2024-07-29 MED ORDER — ASPIRIN 81 MG PO TBEC
81.0000 mg | DELAYED_RELEASE_TABLET | Freq: Every day | ORAL | Status: DC
  Administered 2024-07-29: 81 mg via ORAL
  Filled 2024-07-29: qty 1

## 2024-07-29 NOTE — ED Triage Notes (Addendum)
 Pt to ED c/o sudden onset confusion that lasted a couple mins aprox 30 mins ago, which has since resolved. Pt denies pain, voicing no complaints at this time. Reports hx of the same. A&O X 4 in triage.

## 2024-07-29 NOTE — ED Provider Triage Note (Signed)
 Emergency Medicine Provider Triage Evaluation Note  Nancy Vasquez , a 83 y.o. female  was evaluated in triage.  Pt complains of episode of aphasia.  Patient states that happened around 10 to 10:30 AM.  Subsequently lasted for about 25 minutes.  She states that she could remember feel like she needed to speak but subsequently was not able to produce the words.  Subsequently resolved upon arrival to the hospital.  Denies any other vision changes.  No numbness or tingling anywhere. No weakness.   Currently, patient states that she is feeling back to baseline.  Husband at bedside states that she is acting at baseline..  Review of Systems  Positive: Speech difficulty Negative: CP, SOB, N/v/d  Physical Exam  BP (!) 157/66 (BP Location: Left Arm)   Pulse (!) 54   Temp 98.5 F (36.9 C)   Resp 18   SpO2 100%  Gen:   Awake, no distress   Resp:  Normal effort  MSK:   Moves extremities without difficulty  Other:    Medical Decision Making  Medically screening exam initiated at 11:33 AM.  Appropriate orders placed.  [name] was informed that the remainder of the evaluation will be completed by another provider, this initial triage assessment does not replace that evaluation, and the importance of remaining in the ED until their evaluation is complete.  Currently NIH is 0.  Cranials 2 through 12 intact.  No focal deficits.  No dysarthria or aphasia.  Strength and sensation intact in bilateral upper and lower extremities.  Normal finger-nose.  Smile symmetric.  No sensory changes of the face.  Pupils equal and reactive bilaterally.  No concerns for current CVA.  Possible TIA.  Needs further metabolic workup as well as imaging.   Simon Lavonia SAILOR, MD 07/29/24 [id number]

## 2024-07-29 NOTE — ED Notes (Signed)
 Neuro at bedside.

## 2024-07-29 NOTE — ED Provider Notes (Signed)
 [facility] Provider Note   CSN: [id number] Arrival date & time: 07/29/24  1108     History  Chief Complaint  Patient presents with   Altered Mental Status    Nancy Vasquez is a 83 y.o. female with PMH as listed below who presents with aphasia.  Patient states that happened around 10 to 10:30 AM.  Subsequently lasted for about 25 minutes.  She states that she could remember feel like she needed to speak but subsequently was not able to produce the words.  Subsequently resolved upon arrival to the hospital. Similar to what happened to her before, had a TIA last year. Denies any other vision changes.  No numbness or tingling anywhere. No weakness.   Currently, patient states that she is feeling back to baseline.  Husband at bedside states that she is acting at baseline. Denies HA, falls/head trauma, dizziness.  Takes plavix  and rosuvastatin . Doesn't take aspirin  or another AC.    Past Medical History:  Diagnosis Date   Arthritis    has had right hip replacement   Coronary artery disease 09/27/2022   Folliculitis 04/10/2023   History of TIA (transient ischemic attack) 07/29/2024       Home Medications Prior to Admission medications   Medication Sig Start Date End Date Taking? Authorizing Provider  clopidogrel  (PLAVIX ) 75 MG tablet Take 1 tablet (75 mg total) by mouth daily. 01/06/24  Yes   ergocalciferol  (VITAMIN D2) 1.25 MG (50000 UT) capsule Take 1 capsule (50,000 Units total) by mouth once a week. 08/20/23  Yes Levern Hutching, MD  nitroGLYCERIN  (NITROSTAT ) 0.4 MG SL tablet Place under the tongue at the first sign of attack. Repeat every 5 minutes up to 3 tabs if no relief see medical help. 08/20/23  Yes Levern Hutching, MD  rosuvastatin  (CRESTOR ) 10 MG tablet Take 1 tablet (10 mg total) by mouth daily. 07/05/24  Yes       Allergies    Ibuprofen and Oxycodone     Review of Systems   Review of Systems A 10 point review of systems was  performed and is negative unless otherwise reported in HPI.  Physical Exam Updated Vital Signs BP (!) 161/65 (BP Location: Right Arm)   Pulse (!) 52   Temp 98 F (36.7 C)   Resp 16   Ht 5' 4 (1.626 m)   Wt 68.5 kg   SpO2 100%   BMI 25.92 kg/m  Physical Exam General: Normal appearing female, lying in bed.  HEENT: PERRLA, EOMI, Sclera anicteric, MMM, trachea midline.  Cardiology: RRR, no murmurs/rubs/gallops.  Resp: Normal respiratory rate and effort. CTAB, no wheezes, rhonchi, crackles.  Abd: Soft, non-tender, non-distended. No rebound tenderness or guarding.  GU: Deferred. MSK: No peripheral edema or signs of trauma. Extremities without deformity or TTP. No cyanosis or clubbing. Skin: warm, dry.  Neuro: A&Ox4, CNs II-XII grossly intact. 5/5 strength in all extremities. Sensation grossly intact. Normal speech. Psych: Normal mood and affect.   ED Results / Procedures / Treatments   Labs (all labs ordered are listed, but only abnormal results are displayed) Labs Reviewed  COMPREHENSIVE METABOLIC PANEL WITH GFR - Abnormal; Notable for the following components:      Result Value   Glucose, Bld 101 (*)    All other components within normal limits  CBC WITH DIFFERENTIAL/PLATELET - Abnormal; Notable for the following components:   RBC 3.81 (*)    Hemoglobin 11.6 (*)    All other components  within normal limits  URINALYSIS, ROUTINE W REFLEX MICROSCOPIC - Abnormal; Notable for the following components:   Leukocytes,Ua TRACE (*)    All other components within normal limits  PROTIME-INR  LIPID PANEL  HEMOGLOBIN A1C  CBC  BASIC METABOLIC PANEL WITH GFR    EKG EKG Interpretation Date/Time:  Friday July 29 2024 11:43:20 EDT Ventricular Rate:  56 PR Interval:  158 QRS Duration:  88 QT Interval:  442 QTC Calculation: 426 R Axis:   53  Text Interpretation: Sinus bradycardia with sinus arrhythmia Otherwise normal ECG Confirmed by Franklyn Gills [phone]) on 07/29/2024 4:32:10  PM  Radiology CT Head Wo Contrast Result Date: 07/29/2024 CLINICAL DATA:  TIA workup. Eval for evidence of prior infarcts EXAM: CT HEAD WITHOUT CONTRAST TECHNIQUE: Contiguous axial images were obtained from the base of the skull through the vertex without intravenous contrast. RADIATION DOSE REDUCTION: This exam was performed according to the departmental dose-optimization program which includes automated exposure control, adjustment of the mA and/or kV according to patient size and/or use of iterative reconstruction technique. COMPARISON:  07/09/2023. FINDINGS: Brain: There is periventricular white matter decreased attenuation consistent with small vessel ischemic changes. Ventricles, sulci and cisterns are prominent consistent with age related involutional changes. No acute intracranial hemorrhage, mass effect or shift. No hydrocephalus. Vascular: No hyperdense vessel or unexpected calcification. Skull: Normal. Negative for fracture or focal lesion. Sinuses/Orbits: No acute finding. IMPRESSION: Atrophy and chronic small vessel ischemic changes. No acute intracranial process identified. Electronically Signed   By: Fonda Field M.D.   On: 07/29/2024 12:03    Procedures Procedures    Medications Ordered in ED Medications   stroke: early stages of recovery book (has no administration in time range)  acetaminophen  (TYLENOL ) tablet 650 mg (has no administration in time range)    Or  acetaminophen  (TYLENOL ) 160 MG/5ML solution 650 mg (has no administration in time range)    Or  acetaminophen  (TYLENOL ) suppository 650 mg (has no administration in time range)  senna-docusate (Senokot-S) tablet 1 tablet (has no administration in time range)  enoxaparin  (LOVENOX ) injection 40 mg (has no administration in time range)  ondansetron  (ZOFRAN ) injection 4 mg (has no administration in time range)  clopidogrel  (PLAVIX ) tablet 75 mg (has no administration in time range)  rosuvastatin  (CRESTOR ) tablet 10 mg  (has no administration in time range)  aspirin  EC tablet 81 mg (has no administration in time range)    ED Course/ Medical Decision Making/ A&P                          Medical Decision Making Amount and/or Complexity of Data Reviewed Labs:  Decision-making details documented in ED Course. Radiology:  Decision-making details documented in ED Course.  Risk Decision regarding hospitalization.    This patient presents to the ED for concern of aphasia now resolved, this involves an extensive number of treatment options, and is a complaint that carries with it a high risk of complications and morbidity.  I considered the following differential and admission for this acute, potentially life threatening condition. Patient is overall very well-appearing. Has mild sinus bradycardia with no dizziness/CP/SOB.   MDM:    For her 30 minutes of aphasia, consider most likely a TIA. No electrolyte derangements or glucose derangements on her labs. Patient is now at her baseline mental status and neurologically intact, now c/f active CVA. She has h/o TIA and already takes plavix /rosuvastatin . Neurology recommended medical admission for TIA workup and  routine EEG.  Clinical Course as of 07/29/24 2111  Fri Jul 29, 2024  1631 CT Head Wo Contrast Atrophy and chronic small vessel ischemic changes. No acute intracranial process identified.   [HN]  1631 Unremarkable CBC/CMP [HN]  1715 Discussed with neurology who will come to see the patient [HN]  1817 Urinalysis, Routine w reflex microscopic -Urine, Clean Catch(!) neg [HN]  1818 Discussed with neurology who recommends medical admission for TIA workup as well as a routine EEG [HN]    Clinical Course User Index [HN] Franklyn Sid SAILOR, MD    Labs: I Ordered, and personally interpreted labs.  The pertinent results include:  those listed above  Imaging Studies ordered: I ordered imaging studies including CTH ordered from triage I independently  visualized and interpreted imaging. I agree with the radiologist interpretation  Additional history obtained from chart review.  Cardiac Monitoring: The patient was maintained on a cardiac monitor.  I personally viewed and interpreted the cardiac monitored which showed an underlying rhythm of: sinus bradycardia  Reevaluation: After the interventions noted above, I reevaluated the patient and found that they have :resolved  Social Determinants of Health: Lives independently  Disposition: Admit to medicine  Co morbidities that complicate the patient evaluation  Past Medical History:  Diagnosis Date   Arthritis    has had right hip replacement   Coronary artery disease 09/27/2022   Folliculitis 04/10/2023   History of TIA (transient ischemic attack) 07/29/2024     Medicines Meds ordered this encounter  Medications    stroke: early stages of recovery book   OR Linked Order Group    acetaminophen  (TYLENOL ) tablet 650 mg    acetaminophen  (TYLENOL ) 160 MG/5ML solution 650 mg    acetaminophen  (TYLENOL ) suppository 650 mg   senna-docusate (Senokot-S) tablet 1 tablet   enoxaparin  (LOVENOX ) injection 40 mg   ondansetron  (ZOFRAN ) injection 4 mg   clopidogrel  (PLAVIX ) tablet 75 mg   rosuvastatin  (CRESTOR ) tablet 10 mg   aspirin  EC tablet 81 mg    I have reviewed the patients home medicines and have made adjustments as needed  Problem List / ED Course: Problem List Items Addressed This Visit       Cardiovascular and Mediastinum   TIA (transient ischemic attack) - Primary   Relevant Medications   enoxaparin  (LOVENOX ) injection 40 mg   rosuvastatin  (CRESTOR ) tablet 10 mg   aspirin  EC tablet 81 mg                This note was created using dictation software, which may contain spelling or grammatical errors.    Franklyn Sid SAILOR, MD 07/29/24 2111

## 2024-07-29 NOTE — Consult Note (Signed)
 NEUROLOGY CONSULT NOTE   Date of service: July 29, 2024 Patient Name: Nancy Vasquez MRN:  [id number] DOB:  1941-09-21 Chief Complaint: 30 to 40-minute episode of aphasia Requesting Provider: Tobie Jorie SAUNDERS, MD  History of Present Illness  Nancy Vasquez is a 83 y.o. female with hx of history of CAD, prior TIA who presents with about 30 minutes episode of aphasia.  Patient reports that she had a broken front tooth and just went to her dentist to get it glued.  She denies getting any numbing medication.  She got back in around 10:30 AM, had sudden onset speech deficit.  She was unable to call out for her husband.  The only thing that she could say was hospital.  Per husband, she seemed lost.  She was brought into the ED and by the time of arrival, her symptoms are completely resolved.  She endorses that she had 1 similar episode a few years back and was told that it was a TIA.  She denies any prior history of strokes, her mother had a stroke.  She denies any history of diabetes, hypertension.  She endorses a history of hyperlipidemia and is on Crestor .  She does not smoke, does not use any recreational substances, she does not drink alcohol.  LKW: 10:30 AM Modified rankin score: 0 IV Thrombolysis: Not offered, symptoms are completely resolved.   EVT: Not offered, symptoms completely resolved.    NIHSS components Score: Comment  1a Level of Conscious 0[]  1[]  2[]  3[]      1b LOC Questions 0[]  1[]  2[]       1c LOC Commands 0[]  1[]  2[]       2 Best Gaze 0[]  1[]  2[]       3 Visual 0[]  1[]  2[]  3[]      4 Facial Palsy 0[]  1[]  2[]  3[]      5a Motor Arm - left 0[]  1[]  2[]  3[]  4[]  UN[]    5b Motor Arm - Right 0[]  1[]  2[]  3[]  4[]  UN[]    6a Motor Leg - Left 0[]  1[]  2[]  3[]  4[]  UN[]    6b Motor Leg - Right 0[]  1[]  2[]  3[]  4[]  UN[]    7 Limb Ataxia 0[]  1[]  2[]  UN[]      8 Sensory 0[]  1[]  2[]  UN[]      9 Best Language 0[]  1[]  2[]  3[]      10 Dysarthria 0[]  1[]  2[]  UN[]      11 Extinct. and Inattention  0[]  1[]  2[]       TOTAL: 0      ROS  Comprehensive ROS performed and pertinent positives documented in HPI   Past History   Past Medical History:  Diagnosis Date   Arthritis    has had right hip replacement   Coronary artery disease 09/27/2022   Folliculitis 04/10/2023   History of TIA (transient ischemic attack) 07/29/2024    Past Surgical History:  Procedure Laterality Date   ABDOMINAL HYSTERECTOMY     BREAST EXCISIONAL BIOPSY Left    CORONARY PRESSURE/FFR STUDY N/A 03/17/2017   Procedure: Intravascular Pressure Wire/FFR Study;  Surgeon: Rober Chroman, MD;  Location[facility] INVASIVE CV LAB;  Service: Cardiovascular;  Laterality: N/A;  mid LAD   LEFT HEART CATH AND CORONARY ANGIOGRAPHY N/A 03/17/2017   Procedure: Left Heart Cath and Coronary Angiography;  Surgeon: Rober Chroman, MD;  Location: [facility] INVASIVE CV LAB;  Service: Cardiovascular;  Laterality: N/A;   TONSILLECTOMY     Removed as a child   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   Procedure: LEFT  TOTAL KNEE ARTHROPLASTY;  Surgeon: Addie Cordella Hamilton, MD;  Location: [facility] OR;  Service: Orthopedics;  Laterality: Left;    Family History: Family History  Problem Relation Age of Onset   Breast cancer Sister 81       metastatic breast ca    Social History  reports that she has never smoked. She has never used smokeless tobacco. She reports that she does not drink alcohol and does not use drugs.  Allergies  Allergen Reactions   Ibuprofen Nausea And Vomiting and Other (See Comments)    burns stomach   Oxycodone  Nausea Only    Burns stomach    Medications   Current Facility-Administered Medications:    [START ON 07/30/2024]  stroke: early stages of recovery book, , Does not apply, Once, Tobie Jorie SAUNDERS, MD   acetaminophen  (TYLENOL ) tablet 650 mg, 650 mg, Oral, Q4H PRN **OR** acetaminophen  (TYLENOL ) 160 MG/5ML solution 650 mg, 650 mg, Per Tube, Q4H PRN **OR** acetaminophen  (TYLENOL ) suppository 650 mg, 650 mg, Rectal, Q4H  PRN, Tobie, Vishal R, MD   enoxaparin  (LOVENOX ) injection 40 mg, 40 mg, Subcutaneous, Q24H, Patel, Vishal R, MD   ondansetron  (ZOFRAN ) injection 4 mg, 4 mg, Intravenous, Q6H PRN, Tobie, Vishal R, MD   senna-docusate (Senokot-S) tablet 1 tablet, 1 tablet, Oral, QHS PRN, Tobie Jorie R, MD  Vitals   Vitals:   07/29/24 1656 07/29/24 1700 07/29/24 1715 07/29/24 1730  BP:  (!) 145/80 133/65 (!) 114/101  Pulse:  (!) 46 (!) 53 (!) 51  Resp:  17 16 15   Temp:      SpO2:  100% 100% 100%  Weight: 68.5 kg     Height: 5' 4 (1.626 m)       Body mass index is 25.92 kg/m.   Physical Exam   General: Laying comfortably in bed; in no acute distress.  HENT: Normal oropharynx and mucosa. Normal external appearance of ears and nose.  Neck: Supple, no pain or tenderness  CV: No JVD. No peripheral edema.  Pulmonary: Symmetric Chest rise. Normal respiratory effort.  Abdomen: Soft to touch, non-tender.  Ext: No cyanosis, edema, or deformity  Skin: No rash. Normal palpation of skin.   Musculoskeletal: Normal digits and nails by inspection. No clubbing.   Neurologic Examination  Mental status/Cognition: Alert, oriented to self, place, month and year, good attention.  Speech/language: Fluent, comprehension intact, object naming intact, repetition intact.  Cranial nerves:   CN II Pupils equal and reactive to light, no VF deficits    CN III,IV,VI EOM intact, no gaze preference or deviation, no nystagmus    CN V normal sensation in V1, V2, and V3 segments bilaterally    CN VII no asymmetry, no nasolabial fold flattening    CN VIII normal hearing to speech   CN IX & X normal palatal elevation, no uvular deviation    CN XI 5/5 head turn and 5/5 shoulder shrug bilaterally   CN XII midline tongue protrusion    Motor:  Muscle bulk: normal, tone normal, pronator drift nonen tremor none Mvmt Root Nerve  Muscle Right Left Comments  SA C5/6 Ax Deltoid 5 5   EF C5/6 Mc Biceps 5 5   EE C6/7/8 Rad Triceps 5  5   WF C6/7 Med FCR     WE C7/8 PIN ECU     F Ab C8/T1 U ADM/FDI 5 5   HF L1/2/3 Fem Illopsoas 5 5   KE L2/3/4 Fem Quad 5 5   DF L4/5  D Peron Tib Ant 5 5   PF S1/2 Tibial Grc/Sol 5 5     Sensation:  Light touch Intact throughout   Pin prick    Temperature    Vibration   Proprioception    Coordination/Complex Motor:  - Finger to Nose intact bilaterally - Heel to shin intact bilaterally - Rapid alternating movement are normal - Gait: Deferred for patient safety. Labs/Imaging/Neurodiagnostic studies   CBC:  Recent Labs  Lab 12-Aug-2024 1141  WBC 5.4  NEUTROABS 2.9  HGB 11.6*  HCT 37.0  MCV 97.1  PLT 266   Basic Metabolic Panel:  Lab Results  Component Value Date   NA 140 08-12-24   K 3.6 12-Aug-2024   CO2 23 2024-08-12   GLUCOSE 101 (H) Aug 12, 2024   BUN 8 08/12/2024   CREATININE 0.70 August 12, 2024   CALCIUM  9.0 August 12, 2024   GFRNONAA >60 2024/08/12   GFRAA >60 04/23/2020   Lipid Panel:  Lab Results  Component Value Date   LDLCALC 79 02/24/2024   HgbA1c:  Lab Results  Component Value Date   HGBA1C 5.0 02/24/2024   Urine Drug Screen:     Component Value Date/Time   LABOPIA NONE DETECTED 09/27/2022 0023   COCAINSCRNUR NONE DETECTED 09/27/2022 0023   LABBENZ NONE DETECTED 09/27/2022 0023   AMPHETMU NONE DETECTED 09/27/2022 0023   THCU NONE DETECTED 09/27/2022 0023   LABBARB NONE DETECTED 09/27/2022 0023    Alcohol Level     Component Value Date/Time   ETH <10 09/27/2022 1709   INR  Lab Results  Component Value Date   INR 1.1 08-12-24   APTT  Lab Results  Component Value Date   APTT 26 09/27/2022   AED levels: No results found for: PHENYTOIN, ZONISAMIDE, LAMOTRIGINE, LEVETIRACETA  CT Head without contrast(Personally reviewed): CTH was negative for a large hypodensity concerning for a large territory infarct or hyperdensity concerning for an ICH  CT angio Head and Neck with contrast(Personally reviewed): Pending  MRI  Brain(Personally reviewed): Pending  ASSESSMENT   Nancy Vasquez is a 83 y.o. female with history of CAD, hyperlipidemia, prior TIA who presents with 30 to 40-minute episode of word finding difficulty that spontaneously resolved.  Patient is completely aware of her episode.  Endorses a prior similar episode a few years ago, was told that this was a TIA.  I suspect this was probably a TIA.  RECOMMENDATIONS  - Frequent Neuro checks per stroke unit protocol - Recommend brain imaging with MRI Brain without contrast - Recommend Vascular imaging with CT angio head and neck with and without contrast - Recommend obtaining TTE  - Recommend obtaining Lipid panel with LDL - Please start statin if LDL > 70 - Recommend HbA1c to evaluate for diabetes and how well it is controlled. - Antithrombotic -aspirin  81 mg daily along with Plavix  75 mg daily for 21 days, followed by aspirin  81 mg daily alone. - Recommend DVT ppx - SBP goal - permissive hypertension first 24 h < 220/110. Held home meds.  - Recommend Telemetry monitoring for arrythmia - Recommend bedside swallow screen prior to PO intake. - Stroke education booklet - Recommend PT/OT/SLP consult - Routine EEG in addition to stroke workup. ______________________________________________________________________    Signed, Briella Hobday, MD [facility]

## 2024-07-29 NOTE — Hospital Course (Signed)
 Nancy Vasquez is a 83 y.o. female with medical history significant for CAD, TIA who presented with transient aphasia and is admitted for CVA workup.

## 2024-07-29 NOTE — ED Triage Notes (Signed)
 EDP to bedside to assess pt

## 2024-07-29 NOTE — H&P (Signed)
 History and Physical    Nancy Vasquez FMW:[id number] DOB: 1941-06-03 DOA: 07/29/2024  PCP: Delbert Clam, MD  Patient coming from: Home  I have personally briefly reviewed patient's old medical records in [facility]  Chief Complaint: Aphasia  HPI: Nancy Vasquez is a 83 y.o. female with medical history significant for CAD, TIA who presented to the ED for evaluation of transient aphasia.  Patient states she had a dentist appointment this morning to get her broken front tooth glued.  After returning home she developed new onset speech abnormality.  She was unable to get her words out.  She was able to get her husband's attention but was unable to communicate what she wanted to say.  She did not have any associated weakness or numbness/tingling in her extremities.  Husband did not notice any facial droop.  She has not had any chest pain, palpitations, dyspnea.  Her symptoms lasted about 30 minutes before resolving.  She reports that this is similar to when she was admitted for TIA September 2023.  At that time she had negative workup as well as wore a 30-day cardiac monitor which did not show any atrial fibrillation/flutter.  She reports appearance to Plavix  and rosuvastatin .  ED Course  Labs/Imaging on admission: I have personally reviewed following labs and imaging studies.  Initial vitals showed BP 157/66, pulse 54, RR 18, temp 98.5 F, SpO2 100% on room air.  Labs showed WBC 5.4, hemoglobin 11.6, platelets 266, sodium 140, potassium 3.6, bicarb 23, BUN 8, creatinine 0.70, serum glucose 101, LFTs within normal limits.  UA negative for UTI.  CT head without contrast showed atrophy and chronic small vessel ischemic changes.  No acute intracranial process identified.  Neurology were consulted and recommended medical admission for CVA workup.  The hospitalist service was consulted to admit.  Review of Systems: All systems reviewed and are negative except as documented in  history of present illness above.   Past Medical History:  Diagnosis Date   Arthritis    has had right hip replacement   Coronary artery disease 09/27/2022   Folliculitis 04/10/2023   History of TIA (transient ischemic attack) 07/29/2024    Past Surgical History:  Procedure Laterality Date   ABDOMINAL HYSTERECTOMY     BREAST EXCISIONAL BIOPSY Left    CORONARY PRESSURE/FFR STUDY N/A 03/17/2017   Procedure: Intravascular Pressure Wire/FFR Study;  Surgeon: Rober Chroman, MD;  Location: [facility] INVASIVE CV LAB;  Service: Cardiovascular;  Laterality: N/A;  mid LAD   LEFT HEART CATH AND CORONARY ANGIOGRAPHY N/A 03/17/2017   Procedure: Left Heart Cath and Coronary Angiography;  Surgeon: Rober Chroman, MD;  Location: [facility] INVASIVE CV LAB;  Service: Cardiovascular;  Laterality: N/A;   TONSILLECTOMY     Removed as a child   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   Procedure: LEFT TOTAL KNEE ARTHROPLASTY;  Surgeon: Addie Cordella Hamilton, MD;  Location: [facility] OR;  Service: Orthopedics;  Laterality: Left;    Social History: Social History   Tobacco Use   Smoking status: Never   Smokeless tobacco: Never  Vaping Use   Vaping status: Never Used  Substance Use Topics   Alcohol use: Never   Drug use: Never   Allergies  Allergen Reactions   Ibuprofen Nausea And Vomiting and Other (See Comments)    burns stomach   Oxycodone  Nausea Only    Burns stomach    Family History  Problem Relation Age of Onset   Breast cancer Sister 15  metastatic breast ca     Prior to Admission medications   Medication Sig Start Date End Date Taking? Authorizing Provider  clopidogrel  (PLAVIX ) 75 MG tablet Take 1 tablet (75 mg total) by mouth daily. 04/23/23   Jinwala, Sagar H, MD  clopidogrel  (PLAVIX ) 75 MG tablet Take 1 tablet (75 mg total) by mouth daily. 01/06/24     diclofenac  Sodium (VOLTAREN  ARTHRITIS PAIN) 1 % GEL Apply 4 g topically 4 (four) times daily. Patient not taking: Reported on 02/23/2024 08/28/23    Fleming, Zelda W, NP  ergocalciferol  (VITAMIN D2) 1.25 MG (50000 UT) capsule Take 1 capsule (50,000 Units total) by mouth once a week. 08/20/23   Levern Hutching, MD  nitroGLYCERIN  (NITROSTAT ) 0.4 MG SL tablet Place under the tongue at the first sign of attack. Repeat every 5 minutes up to 3 tabs if no relief see medical help. 08/20/23   Levern Hutching, MD  predniSONE  (DELTASONE ) 5 MG tablet TAKE AS DIRECTED ON PAPER 12/04/23   Donah Riis A, PA-C  rosuvastatin  (CRESTOR ) 10 MG tablet Take 1 tablet (10 mg total) by mouth daily. 07/05/24     Vitamin D , Ergocalciferol , (DRISDOL ) 1.25 MG (50000 UNIT) CAPS capsule Take 1 capsule (50,000 Units total) by mouth once a week. Patient not taking: Reported on 02/23/2024 08/28/23   Theotis Haze ORN, NP    Physical Exam: Vitals:   07/29/24 1715 07/29/24 1730 07/29/24 1948 07/29/24 1953  BP: 133/65 (!) 114/101 (!) 161/65   Pulse: (!) 53 (!) 51 (!) 52   Resp: 16 15 16    Temp:   98 F (36.7 C)   SpO2: 100% 100% 100%   Weight:    68.5 kg  Height:    5' 4 (1.626 m)   Constitutional: Resting bed, NAD, calm, comfortable Eyes: EOMI, PERRL, lids and conjunctivae normal ENMT: Mucous membranes are moist. Posterior pharynx clear of any exudate or lesions.Normal dentition.  Neck: normal, supple, no masses. Respiratory: clear to auscultation bilaterally, no wheezing, no crackles. Normal respiratory effort. No accessory muscle use.  Cardiovascular: Regular rate and rhythm, no murmurs / rubs / gallops. No extremity edema. 2+ pedal pulses. Abdomen: no tenderness, no masses palpated. Musculoskeletal: no clubbing / cyanosis. No joint deformity upper and lower extremities. Good ROM, no contractures. Normal muscle tone.  Skin: no rashes, lesions, ulcers. No induration Neurologic: Sensation intact. Strength 5/5 in all 4.  Psychiatric: Normal judgment and insight. Alert and oriented x 3. Normal mood.   EKG: Personally reviewed. Sinus bradycardia with sinus arrhythmia,  rate 56.  No acute ischemic changes.  Similar to previous.  Assessment/Plan Principal Problem:   Aphasia Active Problems:   Coronary artery disease   History of TIA (transient ischemic attack)   Nancy Vasquez is a 83 y.o. female with medical history significant for CAD, TIA who presented with transient aphasia and is admitted for CVA workup.  Assessment and Plan: Transient aphasia/history of TIA: Patient presenting with ~30 minute transient expressive aphasia, now back to baseline.  Similar episode in September 2022.  CT head negative.  Suspect this was a TIA.  Neurology recommending admission for further CVA workup. - Obtain MRI brain - CTA head/neck - Echocardiogram - Continue Plavix  mg daily, add aspirin  81 mg daily - Continue rosuvastatin  - Check hemoglobin A1c, lipid panel - Keep on telemetry - PT/OT/SLP eval - Routine EEG - Neurology following  Coronary artery disease: Stable, denies chest pain.  Continue Plavix  and rosuvastatin .  Started on aspirin  as above.  DVT prophylaxis: enoxaparin  (LOVENOX ) injection 40 mg Start: 07/29/24 2000 Code Status: Full code, confirmed with patient on admission Family Communication: Husband at bedside Disposition Plan: From home and likely discharge to home pending clinical progress Consults called: Neurology Severity of Illness: The appropriate patient status for this patient is OBSERVATION. Observation status is judged to be reasonable and necessary in order to provide the required intensity of service to ensure the patient's safety. The patient's presenting symptoms, physical exam findings, and initial radiographic and laboratory data in the context of their medical condition is felt to place them at decreased risk for further clinical deterioration. Furthermore, it is anticipated that the patient will be medically stable for discharge from the hospital within 2 midnights of admission.   Jorie Blanch MD [facility]  If  7PM-7AM, please contact night-coverage [email or web address]  07/29/2024, 8:12 PM

## 2024-07-30 ENCOUNTER — Other Ambulatory Visit (HOSPITAL_COMMUNITY): Payer: Self-pay

## 2024-07-30 ENCOUNTER — Observation Stay (HOSPITAL_COMMUNITY)

## 2024-07-30 DIAGNOSIS — I6523 Occlusion and stenosis of bilateral carotid arteries: Secondary | ICD-10-CM | POA: Diagnosis not present

## 2024-07-30 DIAGNOSIS — R4701 Aphasia: Secondary | ICD-10-CM | POA: Diagnosis not present

## 2024-07-30 DIAGNOSIS — G43109 Migraine with aura, not intractable, without status migrainosus: Secondary | ICD-10-CM | POA: Diagnosis not present

## 2024-07-30 DIAGNOSIS — G459 Transient cerebral ischemic attack, unspecified: Secondary | ICD-10-CM | POA: Diagnosis not present

## 2024-07-30 LAB — CBC
HCT: 33.7 % — ABNORMAL LOW (ref 36.0–46.0)
Hemoglobin: 10.8 g/dL — ABNORMAL LOW (ref 12.0–15.0)
MCH: 30.6 pg (ref 26.0–34.0)
MCHC: 32 g/dL (ref 30.0–36.0)
MCV: 95.5 fL (ref 80.0–100.0)
Platelets: 244 K/uL (ref 150–400)
RBC: 3.53 MIL/uL — ABNORMAL LOW (ref 3.87–5.11)
RDW: 12.3 % (ref 11.5–15.5)
WBC: 6.3 K/uL (ref 4.0–10.5)
nRBC: 0 % (ref 0.0–0.2)

## 2024-07-30 LAB — BASIC METABOLIC PANEL WITH GFR
Anion gap: 8 (ref 5–15)
BUN: 6 mg/dL — ABNORMAL LOW (ref 8–23)
CO2: 22 mmol/L (ref 22–32)
Calcium: 8.7 mg/dL — ABNORMAL LOW (ref 8.9–10.3)
Chloride: 109 mmol/L (ref 98–111)
Creatinine, Ser: 0.61 mg/dL (ref 0.44–1.00)
GFR, Estimated: >60 mL/min (ref >60)
Glucose, Bld: 89 mg/dL (ref 70–99)
Potassium: 3.6 mmol/L (ref 3.5–5.1)
Sodium: 139 mmol/L (ref 135–145)

## 2024-07-30 LAB — LIPID PANEL
Cholesterol: 123 mg/dL (ref 0–200)
HDL: 65 mg/dL (ref >40)
LDL Cholesterol: 46 mg/dL (ref 0–99)
Total CHOL/HDL Ratio: 1.9 ratio
Triglycerides: 59 mg/dL (ref <150)
VLDL: 12 mg/dL (ref 0–40)

## 2024-07-30 MED ORDER — ASPIRIN 81 MG PO TBEC
81.0000 mg | DELAYED_RELEASE_TABLET | Freq: Every day | ORAL | 12 refills | Status: AC
  Filled 2024-07-30 (×2): qty 30, 30d supply, fill #0
  Filled 2024-09-12 (×2): qty 30, 30d supply, fill #1
  Filled 2024-10-26: qty 90, 90d supply, fill #2
  Filled 2025-01-17: qty 90, 90d supply, fill #3

## 2024-07-30 MED ORDER — IOHEXOL 350 MG/ML SOLN
75.0000 mL | Freq: Once | INTRAVENOUS | Status: AC | PRN
  Administered 2024-07-30: 75 mL via INTRAVENOUS

## 2024-07-30 NOTE — Plan of Care (Signed)

## 2024-07-30 NOTE — Progress Notes (Signed)
 EEG complete - results pending

## 2024-07-30 NOTE — Progress Notes (Signed)
 OT screen Note  Patient Details Name: Nancy Vasquez MRN: [id number] DOB: 1941-05-29   Cancelled Treatment:    Reason Eval/Treat Not Completed: OT screened, no needs identified, will sign off (Discussed with PT that pt at functional baseline. No acute skilled OT needs, signing off.)  07/30/2024  AB, OTR/L  Acute Rehabilitation Services  Office: [phone]   [name] 07/30/2024, 10:43 AM

## 2024-07-30 NOTE — Evaluation (Signed)
 Physical Therapy Brief Evaluation and Discharge Note Patient Details Name: Nancy Vasquez MRN: [id number] DOB: [birth year] Today's Date: 07/30/2024   History of Present Illness  83yo female [facility] to [facility] on 07[facility]29/24  for aphasia.SABRA PMH Includes HLD, CAD, TIA, OA.  Clinical Impression  Pt presents with admitting diagnosis above. Pt today was able to ambulate in hallway with [facility] with supervision and able to navigate stairs with CGA. PTA pt reports that she was Mod I with SPC. Pt presents at or near baseline mobility. Pt has no further acute PT needs and will be signing off. Re consult PT if mobility status changes. Pt would benefit from continued mobility with mobility specialist during acute stay.        PT Assessment Patient does not need any further PT services  Assistance Needed at Discharge  PRN    Equipment Recommendations None recommended by PT  Recommendations for Other Services       Precautions/Restrictions Precautions Precautions: Fall Recall of Precautions/Restrictions: Intact Restrictions Weight Bearing Restrictions Per Provider Order: No        Mobility  Bed Mobility   Supine/Sidelying to sit: Independent Sit to supine/sidelying: Independent    Transfers Overall transfer level: Modified independent Equipment used: Straight cane                    Ambulation/Gait Ambulation/Gait assistance: Supervision, Modified independent (Device/Increase time) Gait Distance (Feet): 150 Feet Assistive device: Straight cane Gait Pattern/deviations: Wide base of support, Decreased stride length, Step-through pattern Gait Speed: Pace WFL General Gait Details: no LOB noted. Pt appears to be at baseline.  Home Activity Instructions    Stairs Stairs: Yes Stairs assistance: Contact guard assist Stair Management: Two rails, One rail Left, With cane, One rail Right, Step to pattern, Forwards Number of Stairs: 6 General stair comments: no LOB noted.  Modified  Rankin (Stroke Patients Only) Modified Rankin (Stroke Patients Only) Pre-Morbid Rankin Score: No symptoms Modified Rankin: No symptoms      Balance Overall balance assessment: Mild deficits observed, not formally tested                        Pertinent Vitals/Pain PT - Brief Vital Signs All Vital Signs Stable: Yes Pain Assessment Pain Assessment: No/denies pain     Home Living Family/patient expects to be discharged to:: Private residence Living Arrangements: Spouse/significant other Available Help at Discharge: Family;Available 24 hours/day Home Environment: Stairs in home;Rail - right;Rail - left;Level entry  Stairs-Number of Steps: 12 (6+6) Home Equipment: Cane - single point;Grab bars - tub/shower;Hand held shower head;BSC/3in1   Additional Comments: 3 level house with bedroom on 3rd level.    Prior Function Level of Independence: Independent with assistive device(s) Comments: Mod I SPC    UE/LE Assessment   UE ROM/Strength/Tone/Coordination: WFL    LE ROM/Strength/Tone/Coordination: [facility]      Communication   Communication Communication: No apparent difficulties     Cognition Overall Cognitive Status: Appears within functional limits for tasks assessed/performed       General Comments General comments (skin integrity, edema, etc.): VSS    Exercises     Assessment/Plan    PT Problem List         PT Visit Diagnosis Other abnormalities of gait and mobility (R26.89)    No Skilled PT Patient at baseline level of functioning;Patient is modified independent with all activity/mobility   Co-evaluation  AMPAC 6 Clicks Help needed turning from your back to your side while in a flat bed without using bedrails?: None Help needed moving from lying on your back to sitting on the side of a flat bed without using bedrails?: None Help needed moving to and from a bed to a chair (including a wheelchair)?: None Help needed standing up  from a chair using your arms (e.g., wheelchair or bedside chair)?: None Help needed to walk in hospital room?: A Little Help needed climbing 3-5 steps with a railing? : A Little 6 Click Score: 22      End of Session Equipment Utilized During Treatment: Gait belt Activity Tolerance: Patient tolerated treatment well Patient left: in bed;with call bell/phone within reach Nurse Communication: Mobility status PT Visit Diagnosis: Other abnormalities of gait and mobility (R26.89)     Time: 9074-9053 PT Time Calculation (min) (ACUTE ONLY): 21 min  Charges:   PT Evaluation $PT Eval Moderate Complexity: 1 Mod      Wynona Duhamel B, PT, DPT Acute Rehab Services [phone]   Weda Baumgarner  07/30/2024, 10:11 AM

## 2024-07-30 NOTE — Care Management Obs Status (Signed)
 MEDICARE OBSERVATION STATUS NOTIFICATION   Patient Details  Name: Nancy Vasquez MRN: [id number] Date of Birth: 08-27-1941   Medicare Observation Status Notification Given:  Yes    Jaxson Keener G., RN 07/30/2024, 9:07 AM

## 2024-07-30 NOTE — Progress Notes (Signed)
 STROKE TEAM PROGRESS NOTE   SUBJECTIVE (INTERVAL HISTORY) Her husband is at the bedside.  Overall her condition is completely resolved. She had one episode of visual change and 2 episode of aphasia in 09/2022. All work up neg. 3 months ago she had episode of aphasia, resolved on its own. This time she had again episode of aphasia, lasted 30 min and all work up again neg. She did have some slight HA this time and 3 months ago but not bad HA, no hx of migraine.    OBJECTIVE Temp:  [97.6 F (36.4 C)-98.5 F (36.9 C)] 97.6 F (36.4 C) (08/02 1137) Pulse Rate:  [46-59] 59 (08/02 1137) Cardiac Rhythm: Sinus bradycardia (08/02 0700) Resp:  [12-17] 17 (08/02 1137) BP: (114-161)/(52-101) 115/53 (08/02 1137) SpO2:  [98 %-100 %] 99 % (08/02 1137) Weight:  [68.5 kg] 68.5 kg (08/01 1953)  No results for input(s): GLUCAP in the last 168 hours. Recent Labs  Lab 07/29/24 1141 07/30/24 0215  NA 140 139  K 3.6 3.6  CL 110 109  CO2 23 22  GLUCOSE 101* 89  BUN 8 6*  CREATININE 0.70 0.61  CALCIUM  9.0 8.7*   Recent Labs  Lab 07/29/24 1141  AST 15  ALT 13  ALKPHOS 50  BILITOT 0.7  PROT 6.8  ALBUMIN 3.7   Recent Labs  Lab 07/29/24 1141 07/30/24 0215  WBC 5.4 6.3  NEUTROABS 2.9  --   HGB 11.6* 10.8*  HCT 37.0 33.7*  MCV 97.1 95.5  PLT 266 244   No results for input(s): CKTOTAL, CKMB, CKMBINDEX, TROPONINI in the last 168 hours. Recent Labs    07/29/24 1141  LABPROT 14.6  INR 1.1   Recent Labs    07/29/24 1136  COLORURINE YELLOW  LABSPEC 1.020  PHURINE 5.0  GLUCOSEU NEGATIVE  HGBUR NEGATIVE  BILIRUBINUR NEGATIVE  KETONESUR NEGATIVE  PROTEINUR NEGATIVE  NITRITE NEGATIVE  LEUKOCYTESUR TRACE*       Component Value Date/Time   CHOL 123 07/30/2024 0215   CHOL 173 02/24/2024 0843   TRIG 59 07/30/2024 0215   HDL 65 07/30/2024 0215   HDL 78 02/24/2024 0843   CHOLHDL 1.9 07/30/2024 0215   VLDL 12 07/30/2024 0215   LDLCALC 46 07/30/2024 0215   LDLCALC 79  02/24/2024 0843   Lab Results  Component Value Date   HGBA1C 5.0 02/24/2024      Component Value Date/Time   LABOPIA NONE DETECTED 09/27/2022 0023   COCAINSCRNUR NONE DETECTED 09/27/2022 0023   LABBENZ NONE DETECTED 09/27/2022 0023   AMPHETMU NONE DETECTED 09/27/2022 0023   THCU NONE DETECTED 09/27/2022 0023   LABBARB NONE DETECTED 09/27/2022 0023    No results for input(s): ETH in the last 168 hours.  I have personally reviewed the radiological images below and agree with the radiology interpretations.  EEG adult Result Date: 07/30/2024 Gregg Lek, MD     07/30/2024  9:08 AM Patient Name: Nancy Vasquez MRN: [id number] Epilepsy Attending: Lek Gregg Referring Physician/Provider: No ref. provider found     Date: 07/30/2024 Duration: 25 minutes Patient history:  83 y.o. female with hx of history of CAD, prior TIA who presents with about 30 minutes episode of aphasia. EEG to evaluate for seizure Level of alertness: sleep AEDs during EEG study: None Technical aspects: This EEG study was done with scalp electrodes positioned according to the 10-20 International system of electrode placement. Electrical activity was reviewed with band pass filter of 1-70Hz , sensitivity of 7 uV/mm, display speed  of 37mm/sec with a 60Hz  notched filter applied as appropriate. EEG data were recorded continuously and digitally stored.  Video monitoring was available and reviewed as appropriate. Description: The posterior dominant rhythm was not seen as patient was asleep throughout the study. Sleep was characterized by vertex waves, sleep spindles (12 to 14 Hz), maximal frontocentral region.  ABNORMALITY -None IMPRESSION: This study obtained during sleep is within normal limits. No seizures or epileptiform discharges were seen throughout the recording. A normal interictal EEG does not exclude nor support the diagnosis of epilepsy. Pastor Falling MD Neurology    CT [facility] HEAD NECK W WO CM Result Date:  07/30/2024 EXAM: CTA Head and Neck with Intravenous Contrast. CT Head without Contrast. CLINICAL HISTORY: Transient ischemic attack (TIA). TECHNIQUE: Axial CTA images of the head and neck performed with intravenous contrast. MIP reconstructed images were created and reviewed. Axial computed tomography images of the head/brain performed without intravenous contrast. Note: Per PQRS, the description of internal carotid artery percent stenosis, including 0 percent or normal exam, is based on [place] American Symptomatic Carotid Endarterectomy Trial (NASCET) criteria. Dose reduction technique was used including one or more of the following: automated exposure control, adjustment of mA and kV according to patient size, and/or iterative reconstruction. CONTRAST: 75 mL iohexol  (OMNIPAQUE ) 350 mg/mL. COMPARISON: 09/27/2022. FINDINGS: CT HEAD: BRAIN: No acute intraparenchymal hemorrhage. No mass lesion. No CT evidence for acute territorial infarct. No midline shift or extra-axial collection. VENTRICLES: No hydrocephalus. ORBITS: The orbits are unremarkable. SINUSES AND MASTOIDS: The paranasal sinuses and mastoid air cells are clear. CTA NECK: COMMON CAROTID ARTERIES: Mild calcific atherosclerosis at the right carotid bifurcation without hemodynamically significant stenosis. No dissection or occlusion. INTERNAL CAROTID ARTERIES: Calcific atherosclerosis of the internal carotid arteries at the skull base without hemodynamically significant stenosis. No dissection or occlusion. VERTEBRAL ARTERIES: No significant stenosis. No dissection or occlusion. CTA HEAD: ANTERIOR CEREBRAL ARTERIES: No significant stenosis. No occlusion. No aneurysm. MIDDLE CEREBRAL ARTERIES: No significant stenosis. No occlusion. No aneurysm. POSTERIOR CEREBRAL ARTERIES: No significant stenosis. No occlusion. No aneurysm. BASILAR ARTERY: No significant stenosis. No occlusion. No aneurysm. OTHER: SOFT TISSUES: No acute finding. No masses or lymphadenopathy.  BONES: No acute osseous abnormality. IMPRESSION: 1. No acute intracranial hemorrhage or ischemic change. 2. Mild calcific atherosclerosis at the right carotid bifurcation and calcific atherosclerosis of the internal carotid arteries at the skull base, without hemodynamically significant stenosis. Electronically signed by: Franky Stanford MD 07/30/2024 12:45 AM EDT RP Workstation: [id number]   MR BRAIN WO CONTRAST Result Date: 07/29/2024 EXAM: MRI BRAIN WITHOUT CONTRAST 07/29/2024 09:16:24 PM TECHNIQUE: Multiplanar multisequence MRI of the head/brain was performed without the administration of intravenous contrast. COMPARISON: 07/10/2023 CLINICAL HISTORY: Transient ischemic attack (TIA). FINDINGS: BRAIN AND VENTRICLES: No acute infarct. No intracranial hemorrhage. No mass. No midline shift. No hydrocephalus. The sella is unremarkable. Normal flow voids. Multifocal hyperintense T2-weighted signal within the cerebral white matter, most commonly due to chronic small vessel disease. ORBITS: Ocular lens replacements unchanged. SINUSES AND MASTOIDS: No acute abnormality. BONES AND SOFT TISSUES: Normal marrow signal. No acute soft tissue abnormality. 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 IMPRESSION: 1. No acute intracranial abnormality. 2. Multifocal hyperintense T2-weighted signal within the cerebral white matter, most commonly due to chronic small vessel disease. 3. 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 Electronically signed by: Franky Stanford MD 07/29/2024 09:23 PM EDT RP Workstation: [id number]   CT Head Wo Contrast Result Date: 07/29/2024  CLINICAL DATA:  TIA workup. Eval for evidence of prior infarcts EXAM: CT HEAD WITHOUT CONTRAST TECHNIQUE: Contiguous axial images were obtained from the base of the skull through the vertex without intravenous contrast. RADIATION DOSE REDUCTION: This exam was performed according to the departmental dose-optimization program  which includes automated exposure control, adjustment of the mA and/or kV according to patient size and/or use of iterative reconstruction technique. COMPARISON:  07/09/2023. FINDINGS: Brain: There is periventricular white matter decreased attenuation consistent with small vessel ischemic changes. Ventricles, sulci and cisterns are prominent consistent with age related involutional changes. No acute intracranial hemorrhage, mass effect or shift. No hydrocephalus. Vascular: No hyperdense vessel or unexpected calcification. Skull: Normal. Negative for fracture or focal lesion. Sinuses/Orbits: No acute finding. IMPRESSION: Atrophy and chronic small vessel ischemic changes. No acute intracranial process identified. Electronically Signed   By: Fonda Field M.D.   On: 07/29/2024 12:03     PHYSICAL EXAM  Temp:  [97.6 F (36.4 C)-98.5 F (36.9 C)] 97.6 F (36.4 C) (08/02 1137) Pulse Rate:  [46-59] 59 (08/02 1137) Resp:  [12-17] 17 (08/02 1137) BP: (114-161)/(52-101) 115/53 (08/02 1137) SpO2:  [98 %-100 %] 99 % (08/02 1137) Weight:  [68.5 kg] 68.5 kg (08/01 1953)  General - Well nourished, well developed, in no apparent distress.  Ophthalmologic - fundi not visualized due to noncooperation.  Cardiovascular - Regular rhythm and rate.  Mental Status -  Level of arousal and orientation to time, place, and person were intact. Language including expression, naming, repetition, comprehension was assessed and found intact. Attention span and concentration were normal. Fund of Knowledge was assessed and was intact.  Cranial Nerves II - XII - II - Visual field intact OU. III, IV, VI - Extraocular movements intact. V - Facial sensation intact bilaterally. VII - Facial movement intact bilaterally. VIII - Hearing & vestibular intact bilaterally. X - Palate elevates symmetrically. XI - Chin turning & shoulder shrug intact bilaterally. XII - Tongue protrusion intact.  Motor Strength - The  patient's strength was normal in all extremities and pronator drift was absent.  Bulk was normal and fasciculations were absent.   Motor Tone - Muscle tone was assessed at the neck and appendages and was normal.  Reflexes - The patient's reflexes were symmetrical in all extremities and she had no pathological reflexes.  Sensory - Light touch, temperature/pinprick were assessed and were symmetrical.    Coordination - The patient had normal movements in the hands and feet with no ataxia or dysmetria.  Tremor was absent.  Gait and Station - deferred.   ASSESSMENT/PLAN Nancy Vasquez is a 83 y.o. female with history of  admitted for HLD, CAD, rheumatic fever in young age admitted for aphasia episode. No TNK given due to symptoms resolved.    Likely migraine equivalent, less likely TIA  Hx of similar episodes She had one episode of visual change and 2 episode of aphasia in 09/2022. Work up with CT, CTA, MRI, echo all negative. LDL 76, and A1C 4.7. Discharged on DAPT and statin. Outpt 30 day monitoring showed no afib 3 months ago she had episode of aphasia, resolved on its own. This time she had again episode of aphasia, lasted 30 min and all work up again neg. She did have some slight HA this time and 3 months ago but not bad HA, no hx of migraine. CT no acute abnormality CTA head and neck unremarkable MRI  no acute infarct 2D Echo  pending LDL 46 HgbA1c  pending Lovenox  for VTE prophylaxis clopidogrel  75 mg daily prior to admission, now on ASA 81 and plavix  for 3 weeks and then back to plavix .  Patient counseled to be compliant with her antithrombotic medications Ongoing aggressive stroke risk factor management Therapy recommendations:  none Disposition:  home, will refer to Dr. Ines for second opinion  Hypertension Stable Long term BP goal normotensive  Hyperlipidemia Home meds:  crestor  10  LDL 46, goal < 70 Now on crestor  10 No high intensity statin given LDL at goal and  less likely TIA Continue statin at discharge  Other Stroke Risk Factors Advanced age Coronary artery disease  Other Active Problems Rheumatic fever at young age  Hospital day # 0  Neurology will sign off. Please call with questions. Pt will follow up with Dr. Ines at [facility] in about 4 weeks. Thanks for the consult.   Ary Cummins, MD PhD Stroke Neurology 07/30/2024 2:34 PM    To contact Stroke Continuity provider, please refer to [email or web address]. After hours, contact General Neurology

## 2024-07-30 NOTE — Discharge Summary (Addendum)
 Physician Discharge Summary   Patient: Nancy Vasquez MRN: [id number] DOB: 06-10-1941  Admit date:     07/29/2024  Discharge date: 07/30/24  Discharge Physician: Nancy Vasquez   PCP: Nancy Clam, MD   Recommendations at discharge:   Patient discharged home with 21 days dual antiplatelet therapy after which she should just continue with the Plavix  as recommended by neurology. Stroke team neurologist referred patient to headache clinic due to possibility of symptoms being complicated migraine rather than TIA.  Treating as TIA regardless, with outpatient follow up to headache specialist   Discharge Diagnoses: Principal Problem:   Aphasia Active Problems:   Coronary artery disease   History of TIA (transient ischemic attack)  Resolved Problems:   * No resolved hospital problems. *  Hospital Course:  83 y.o. female with hx of history of CAD, prior TIA who presents with about 30 minutes episode of aphasia.   Patient reports that she had a broken front tooth and just went to her dentist to get it glued.  She denies getting any numbing medication.  She got back in around 10:30 AM, had sudden onset speech deficit.  She was unable to call out for her husband.  The only thing that she could say was hospital.  Per husband, she seemed lost.  She was brought into the ED and by the time of arrival, her symptoms are completely resolved.  She endorses that she had 1 similar episode a few years back and was told that it was a TIA.  She denies any prior history of strokes, her mother had a stroke.  She denies any history of diabetes, hypertension.  She endorses a history of hyperlipidemia and is on Crestor .  Admitted for the same.  Followed by neurology while in house.  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  She had CT angio head and neck that showed mild calcific atherosclerosis at the right carotid bifurcation and  calcific atherosclerosis of the internal carotid arteries at the skull base, without hemodynamically significant stenosis.  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  Seen by PT and OT while in house.  No follow-up needed as she had completely returned to her baseline.  Symptoms remain completely resolved.  Due to the degree of improvement she was discharged home on 07/30/2024 with recommendations to follow-up with PCP for further risk management mitigation.  Assessment and Plan: Assessment and Plan: Transient aphasia/history of TIA: Patient presenting with ~30 minute transient expressive aphasia, now back to baseline.  Similar episode in September 2022.  CT head negative/MRI head/CTA head and neck.  Suspect this was a TIA.  - Stroke team neurologist believes more likely this was complicated migraine rather than TIA.   - Recommended to see headache specialist by Stroke Team neurologist  Coronary artery disease: Stable, denies chest pain.  Continue Plavix  and rosuvastatin .  Started on aspirin  as above. Follow-up with cardiology outpatient  Normocytic anemia: - Has had similar symptoms in the past.  Needs follow-up with PCP for recheck and ensure she is up-to-date on cancer screening.      Consultants: Neurology Disposition: Home Diet recommendation:  Discharge Diet Orders (From admission, onward)     Start     Ordered   07/30/24 0000  Diet - low sodium heart healthy        07/30/24  1340           Heart healthy diet DISCHARGE MEDICATION: Allergies as of 07/30/2024       Reactions   Ibuprofen Nausea And Vomiting, Other (See Comments)   burns stomach   Oxycodone  Nausea Only   Burns stomach        Medication List     TAKE these medications    aspirin  EC 81 MG tablet Take 1 tablet (81 mg total) by mouth daily. Swallow whole.   clopidogrel  75 MG  tablet Commonly known as: Plavix  Take 1 tablet (75 mg total) by mouth daily.   nitroGLYCERIN  0.4 MG SL tablet Commonly known as: NITROSTAT  Place under the tongue at the first sign of attack. Repeat every 5 minutes up to 3 tabs if no relief see medical help.   rosuvastatin  10 MG tablet Commonly known as: CRESTOR  Take 1 tablet (10 mg total) by mouth daily.   Vitamin D  (Ergocalciferol ) 1.25 MG (50000 UNIT) Caps capsule Commonly known as: DRISDOL  Take 1 capsule (50,000 Units total) by mouth once a week.        Discharge Exam: Filed Weights   07/29/24 1656 07/29/24 1953  Weight: 68.5 kg 68.5 kg   Constitutional: Resting bed, NAD, calm, comfortable Eyes: EOMI, PERRL, lids and conjunctivae normal ENMT: Mucous membranes are moist. Posterior pharynx clear of any exudate or lesions.Normal dentition.  Neck: normal, supple, no masses. Respiratory: clear to auscultation bilaterally, no wheezing, no crackles. Normal respiratory effort. No accessory muscle use.  Cardiovascular: Regular rate and rhythm, no murmurs / rubs / gallops. No extremity edema. 2+ pedal pulses. Abdomen: no tenderness, no masses palpated. Musculoskeletal: no clubbing / cyanosis. No joint deformity upper and lower extremities. Good ROM, no contractures. Normal muscle tone.  Skin: no rashes, lesions, ulcers. No induration Neurologic: Sensation intact. Strength 5/5 in all 4.  Psychiatric: Normal judgment and insight. Alert and oriented x 3. Normal mood.   Condition at discharge: Good  The results of significant diagnostics from this hospitalization (including imaging, microbiology, ancillary and laboratory) are listed below for reference.   Imaging Studies: EEG adult Result Date: 07/30/2024 Camara, Amadou, MD     07/30/2024  9:08 AM Patient Name: Nancy Vasquez MRN: [id number] Epilepsy Attending: Pastor Falling Referring Physician/Provider: No ref. provider found     Date: 07/30/2024 Duration: 25 minutes Patient history:   83 y.o. female with hx of history of CAD, prior TIA who presents with about 30 minutes episode of aphasia. EEG to evaluate for seizure Level of alertness: sleep AEDs during EEG study: None Technical aspects: This EEG study was done with scalp electrodes positioned according to the 10-20 International system of electrode placement. Electrical activity was reviewed with band pass filter of 1-70Hz , sensitivity of 7 uV/mm, display speed of 49mm/sec with a 60Hz  notched filter applied as appropriate. EEG data were recorded continuously and digitally stored.  Video monitoring was available and reviewed as appropriate. Description: The posterior dominant rhythm was not seen as patient was asleep throughout the study. Sleep was characterized by vertex waves, sleep spindles (12 to 14 Hz), maximal frontocentral region.  ABNORMALITY -None IMPRESSION: This study obtained during sleep is within normal limits. No seizures or epileptiform discharges were seen throughout the recording. A normal interictal EEG does not exclude nor support the diagnosis of epilepsy. Pastor Falling MD Neurology    CT [facility] HEAD NECK W WO CM Result Date: 07/30/2024 EXAM: CTA Head and Neck with Intravenous Contrast. CT Head without Contrast. CLINICAL HISTORY:  Transient ischemic attack (TIA). TECHNIQUE: Axial CTA images of the head and neck performed with intravenous contrast. MIP reconstructed images were created and reviewed. Axial computed tomography images of the head/brain performed without intravenous contrast. Note: Per PQRS, the description of internal carotid artery percent stenosis, including 0 percent or normal exam, is based on [place] American Symptomatic Carotid Endarterectomy Trial (NASCET) criteria. Dose reduction technique was used including one or more of the following: automated exposure control, adjustment of mA and kV according to patient size, and/or iterative reconstruction. CONTRAST: 75 mL iohexol  (OMNIPAQUE ) 350 mg/mL. COMPARISON:  09/27/2022. FINDINGS: CT HEAD: BRAIN: No acute intraparenchymal hemorrhage. No mass lesion. No CT evidence for acute territorial infarct. No midline shift or extra-axial collection. VENTRICLES: No hydrocephalus. ORBITS: The orbits are unremarkable. SINUSES AND MASTOIDS: The paranasal sinuses and mastoid air cells are clear. CTA NECK: COMMON CAROTID ARTERIES: Mild calcific atherosclerosis at the right carotid bifurcation without hemodynamically significant stenosis. No dissection or occlusion. INTERNAL CAROTID ARTERIES: Calcific atherosclerosis of the internal carotid arteries at the skull base without hemodynamically significant stenosis. No dissection or occlusion. VERTEBRAL ARTERIES: No significant stenosis. No dissection or occlusion. CTA HEAD: ANTERIOR CEREBRAL ARTERIES: No significant stenosis. No occlusion. No aneurysm. MIDDLE CEREBRAL ARTERIES: No significant stenosis. No occlusion. No aneurysm. POSTERIOR CEREBRAL ARTERIES: No significant stenosis. No occlusion. No aneurysm. BASILAR ARTERY: No significant stenosis. No occlusion. No aneurysm. OTHER: SOFT TISSUES: No acute finding. No masses or lymphadenopathy. BONES: No acute osseous abnormality. IMPRESSION: 1. No acute intracranial hemorrhage or ischemic change. 2. Mild calcific atherosclerosis at the right carotid bifurcation and calcific atherosclerosis of the internal carotid arteries at the skull base, without hemodynamically significant stenosis. Electronically signed by: Franky Stanford MD 07/30/2024 12:45 AM EDT RP Workstation: [id number]   MR BRAIN WO CONTRAST Result Date: 07/29/2024 EXAM: MRI BRAIN WITHOUT CONTRAST 07/29/2024 09:16:24 PM TECHNIQUE: Multiplanar multisequence MRI of the head/brain was performed without the administration of intravenous contrast. COMPARISON: 07/10/2023 CLINICAL HISTORY: Transient ischemic attack (TIA). FINDINGS: BRAIN AND VENTRICLES: No acute infarct. No intracranial hemorrhage. No mass. No midline shift. No  hydrocephalus. The sella is unremarkable. Normal flow voids. Multifocal hyperintense T2-weighted signal within the cerebral white matter, most commonly due to chronic small vessel disease. ORBITS: Ocular lens replacements unchanged. SINUSES AND MASTOIDS: No acute abnormality. BONES AND SOFT TISSUES: Normal marrow signal. No acute soft tissue abnormality. 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 IMPRESSION: 1. No acute intracranial abnormality. 2. Multifocal hyperintense T2-weighted signal within the cerebral white matter, most commonly due to chronic small vessel disease. 3. 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 Electronically signed by: Franky Stanford MD 07/29/2024 09:23 PM EDT RP Workstation: [id number]   CT Head Wo Contrast Result Date: 07/29/2024 CLINICAL DATA:  TIA workup. Eval for evidence of prior infarcts EXAM: CT HEAD WITHOUT CONTRAST TECHNIQUE: Contiguous axial images were obtained from the base of the skull through the vertex without intravenous contrast. RADIATION DOSE REDUCTION: This exam was performed according to the departmental dose-optimization program which includes automated exposure control, adjustment of the mA and/or kV according to patient size and/or use of iterative reconstruction technique. COMPARISON:  07/09/2023. FINDINGS: Brain: There is periventricular white matter decreased attenuation consistent with small vessel ischemic changes. Ventricles, sulci and cisterns are prominent consistent with age related involutional changes. No acute intracranial hemorrhage, mass effect or shift. No hydrocephalus. Vascular: No hyperdense vessel or unexpected calcification. Skull: Normal. Negative for fracture or focal lesion. Sinuses/Orbits:  No acute finding. IMPRESSION: Atrophy and chronic small vessel ischemic changes. No acute intracranial process identified. Electronically Signed   By: Fonda Field M.D.   On: 07/29/2024 12:03     Microbiology: Results for orders placed or performed in visit on 07/14/23  Urine Culture     Status: None   Collection Time: 07/14/23  2:50 PM   Specimen: Urine   UR  Result Value Ref Range Status   Urine Culture, Routine Final report  Final   Organism ID, Bacteria Comment  Final    Comment: Culture shows less than 10,000 colony forming units of bacteria per milliliter of urine. This colony count is not generally considered to be clinically significant.     Labs: CBC: Recent Labs  Lab 07/29/24 1141 07/30/24 0215  WBC 5.4 6.3  NEUTROABS 2.9  --   HGB 11.6* 10.8*  HCT 37.0 33.7*  MCV 97.1 95.5  PLT 266 244   Basic Metabolic Panel: Recent Labs  Lab 07/29/24 1141 07/30/24 0215  NA 140 139  K 3.6 3.6  CL 110 109  CO2 23 22  GLUCOSE 101* 89  BUN 8 6*  CREATININE 0.70 0.61  CALCIUM  9.0 8.7*   Liver Function Tests: Recent Labs  Lab 07/29/24 1141  AST 15  ALT 13  ALKPHOS 50  BILITOT 0.7  PROT 6.8  ALBUMIN 3.7   CBG: No results for input(s): GLUCAP in the last 168 hours.  Discharge time spent: less than 30 minutes.  Signed: Reyes VEAR Gaw, MD [facility] 07/30/2024

## 2024-07-30 NOTE — Procedures (Signed)
 Patient Name: Nancy Vasquez  MRN: [id number]  Epilepsy Attending: Pastor Falling  Referring Physician/Provider: No ref. provider found      Date: 07/30/2024 Duration: 25 minutes   Patient history:  83 y.o. female with hx of history of CAD, prior TIA who presents with about 30 minutes episode of aphasia. EEG to evaluate for seizure  Level of alertness: sleep  AEDs during EEG study: None   Technical aspects: This EEG study was done with scalp electrodes positioned according to the 10-20 International system of electrode placement. Electrical activity was reviewed with band pass filter of 1-70Hz , sensitivity of 7 uV/mm, display speed of 45mm/sec with a 60Hz  notched filter applied as appropriate. EEG data were recorded continuously and digitally stored.  Video monitoring was available and reviewed as appropriate.  Description: The posterior dominant rhythm was not seen as patient was asleep throughout the study. Sleep was characterized by vertex waves, sleep spindles (12 to 14 Hz), maximal frontocentral region.     ABNORMALITY -None    IMPRESSION: This study obtained during sleep is within normal limits. No seizures or epileptiform discharges were seen throughout the recording. A normal interictal EEG does not exclude nor support the diagnosis of epilepsy.   Pastor Falling MD Neurology

## 2024-08-01 ENCOUNTER — Telehealth: Payer: Self-pay

## 2024-08-01 LAB — HEMOGLOBIN A1C
Hgb A1c MFr Bld: 4.9 % (ref 4.8–5.6)
Mean Plasma Glucose: 94 mg/dL

## 2024-08-01 NOTE — Transitions of Care (Post Inpatient/ED Visit) (Signed)
   08/01/2024  Name: HIBO BLASDELL MRN: [id number] DOB: 1941/08/01  Today's TOC FU Call Status: Today's TOC FU Call Status:: Unsuccessful Call (1st Attempt) Unsuccessful Call (1st Attempt) Date: 08/01/24  Attempted to reach the patient regarding the most recent Inpatient/ED visit.  Follow Up Plan: Additional outreach attempts will be made to reach the patient to complete the Transitions of Care (Post Inpatient/ED visit) call.   Signature  Slater Diesel, RN

## 2024-08-02 ENCOUNTER — Other Ambulatory Visit: Payer: Self-pay

## 2024-08-02 ENCOUNTER — Telehealth: Payer: Self-pay

## 2024-08-02 NOTE — Transitions of Care (Post Inpatient/ED Visit) (Signed)
   08/02/2024  Name: Nancy Vasquez MRN: [id number] DOB: August 19, 1941  Today's TOC FU Call Status: Today's TOC FU Call Status:: Unsuccessful Call (2nd Attempt) Unsuccessful Call (1st Attempt) Date: 08/01/24 Unsuccessful Call (2nd Attempt) Date: 08/02/24  Attempted to reach the patient regarding the most recent Inpatient/ED visit.  Follow Up Plan: Additional outreach attempts will be made to reach the patient to complete the Transitions of Care (Post Inpatient/ED visit) call.   Signature  Slater Diesel, RN

## 2024-08-03 ENCOUNTER — Telehealth: Payer: Self-pay

## 2024-08-03 NOTE — Transitions of Care (Post Inpatient/ED Visit) (Signed)
   08/03/2024  Name: Nancy Vasquez MRN: [id number] DOB: 10/31/1941  Today's TOC FU Call Status: Today's TOC FU Call Status:: Unsuccessful Call (3rd Attempt) Unsuccessful Call (1st Attempt) Date: 08/01/24 Unsuccessful Call (2nd Attempt) Date: 08/02/24 Unsuccessful Call (3rd Attempt) Date: 08/03/24  Attempted to reach the patient regarding the most recent Inpatient/ED visit.  Follow Up Plan: No further outreach attempts will be made at this time. We have been unable to contact the patient.  The patient has an appointment with Dr Delbert at [facility] on 08/23/2024.   Signature  Slater Diesel, RN

## 2024-08-05 ENCOUNTER — Encounter: Payer: Self-pay | Admitting: Surgical

## 2024-08-05 ENCOUNTER — Other Ambulatory Visit

## 2024-08-05 ENCOUNTER — Ambulatory Visit: Admitting: Surgical

## 2024-08-05 DIAGNOSIS — M1612 Unilateral primary osteoarthritis, left hip: Secondary | ICD-10-CM | POA: Diagnosis not present

## 2024-08-05 DIAGNOSIS — M25562 Pain in left knee: Secondary | ICD-10-CM | POA: Diagnosis not present

## 2024-08-05 DIAGNOSIS — G8929 Other chronic pain: Secondary | ICD-10-CM

## 2024-08-05 NOTE — Progress Notes (Signed)
 Follow-up Office Visit Note   Patient: Nancy Vasquez           Date of Birth: [birth year]           MRN: [id number] Visit Date: 08/05/2024 Requested by: Delbert Clam, MD [address] [place],  [place] [address] PCP: Delbert Clam, MD  Subjective: Chief Complaint  Patient presents with   Left Knee - Pain    HPI: Nancy Vasquez is a 83 y.o. female who returns to the office for follow-up visit.    Plan at last visit was: Left hip intra-articular injection under ultrasound guidance on 06/16/2024  Since then, patient notes she had near 100% relief of her symptoms for about 3 days in regards to the lateral knee pain she was experiencing.  Since that is worn off, she has returned to having a lot of difficulty with ambulation due to pain.  Most of her pain is in the lateral aspect of the knee but she also has some groin and lateral sided hip pain as well.  No radiation past the knee.              ROS: All systems reviewed are negative as they relate to the chief complaint within the history of present illness.  Patient denies fevers or chills.  Assessment & Plan: Visit Diagnoses:  1. Arthritis of left hip   2. Chronic pain of left knee     Plan: Nancy Vasquez is a 83 y.o. female who returns to the office for follow-up visit.  Plan from last visit was noted above in HPI.  They now return with near 100% relief of her hip/knee pain for 3 days following intra-articular hip injection.  We discussed options available to patient.  At this point, this seems fairly definitive in terms of demonstrating that her knee pain is referred from her arthritic left hip.  She has had right hip replaced in the past.  She would like to hold off on hip replacement if possible.  Other options for pain relief would be trying lumbar spine ESI versus PRP injection into the hip joint to see if either of these modalities provide more relief or longer lasting relief than the intra-articular  cortisone injection.  Patient understands if she tries another cortisone injection into the hip joint, we cannot do hip replacement surgery for 3 months after that.  She will consult with her husband and daughter and call back with her decision on how she would like to proceed.  Follow-Up Instructions: Return if symptoms worsen or fail to improve.   Orders:  No orders of the defined types were placed in this encounter.  No orders of the defined types were placed in this encounter.     Procedures: No procedures performed   Clinical Data: No additional findings.  Objective: Vital Signs: There were no vitals taken for this visit.  Physical Exam:  Constitutional: Patient appears well-developed HEENT:  Head: Normocephalic Eyes:EOM are normal Neck: Normal range of motion Cardiovascular: Normal rate Pulmonary/chest: Effort normal Neurologic: Patient is alert Skin: Skin is warm Psychiatric: Patient has normal mood and affect  Ortho Exam: Ortho exam demonstrates left hip with significantly reduced passive motion.  Any internal or external rotation of her hip has coarse crepitus consistent with arthritis and reproduces her knee/hip pain.  She ambulates with antalgic Trendelenburg gait.  Uses cane for ambulation.  Palpable DP pulse.  Able to perform hip flexion, quadricep, hamstring, dorsiflexion, plantarflexion strength  rated 5/5.  Specialty Comments:  No specialty comments available.  Imaging: No results found.   PMFS History: Patient Active Problem List   Diagnosis Date Noted   Aphasia 07/29/2024   History of TIA (transient ischemic attack) 07/29/2024   Candida vaginitis 04/23/2023   TIA (transient ischemic attack) 03/17/2023   HLD (hyperlipidemia) 11/06/2022   Coronary artery disease 09/27/2022   Osteoarthritis of left knee 04/19/2020   Past Medical History:  Diagnosis Date   Arthritis    has had right hip replacement   Coronary artery disease 09/27/2022    Folliculitis 04/10/2023   History of TIA (transient ischemic attack) 07/29/2024    Family History  Problem Relation Age of Onset   Breast cancer Sister 38       metastatic breast ca    Past Surgical History:  Procedure Laterality Date   ABDOMINAL HYSTERECTOMY     BREAST EXCISIONAL BIOPSY Left    CORONARY PRESSURE/FFR STUDY N/A 03/17/2017   Procedure: Intravascular Pressure Wire/FFR Study;  Surgeon: Rober Chroman, MD;  Location: [facility] INVASIVE CV LAB;  Service: Cardiovascular;  Laterality: N/A;  mid LAD   LEFT HEART CATH AND CORONARY ANGIOGRAPHY N/A 03/17/2017   Procedure: Left Heart Cath and Coronary Angiography;  Surgeon: Rober Chroman, MD;  Location: [facility] INVASIVE CV LAB;  Service: Cardiovascular;  Laterality: N/A;   TONSILLECTOMY     Removed as a child   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   Procedure: LEFT TOTAL KNEE ARTHROPLASTY;  Surgeon: Addie Cordella Hamilton, MD;  Location: [facility] OR;  Service: Orthopedics;  Laterality: Left;   Social History   Occupational History   Not on file  Tobacco Use   Smoking status: Never   Smokeless tobacco: Never  Vaping Use   Vaping status: Never Used  Substance and Sexual Activity   Alcohol use: Never   Drug use: Never   Sexual activity: Yes

## 2024-08-07 ENCOUNTER — Other Ambulatory Visit: Payer: Self-pay | Admitting: Surgical

## 2024-08-07 MED ORDER — ACETAMINOPHEN-CODEINE 300-30 MG PO TABS
1.0000 | ORAL_TABLET | Freq: Every evening | ORAL | 0 refills | Status: AC | PRN
  Filled 2024-08-07: qty 14, 14d supply, fill #0

## 2024-08-08 ENCOUNTER — Other Ambulatory Visit: Payer: Self-pay

## 2024-08-08 ENCOUNTER — Other Ambulatory Visit (HOSPITAL_COMMUNITY): Payer: Self-pay

## 2024-08-18 ENCOUNTER — Ambulatory Visit: Admitting: Surgical

## 2024-08-23 ENCOUNTER — Other Ambulatory Visit (HOSPITAL_COMMUNITY): Payer: Self-pay

## 2024-08-23 ENCOUNTER — Ambulatory Visit: Payer: Medicare Other | Attending: Family Medicine | Admitting: Family Medicine

## 2024-08-23 ENCOUNTER — Other Ambulatory Visit: Payer: Self-pay

## 2024-08-23 ENCOUNTER — Encounter: Payer: Self-pay | Admitting: Family Medicine

## 2024-08-23 VITALS — BP 134/76 | HR 70 | Ht 64.0 in | Wt 151.4 lb

## 2024-08-23 DIAGNOSIS — Z23 Encounter for immunization: Secondary | ICD-10-CM

## 2024-08-23 DIAGNOSIS — M1712 Unilateral primary osteoarthritis, left knee: Secondary | ICD-10-CM | POA: Diagnosis not present

## 2024-08-23 DIAGNOSIS — G459 Transient cerebral ischemic attack, unspecified: Secondary | ICD-10-CM | POA: Diagnosis not present

## 2024-08-23 DIAGNOSIS — K5909 Other constipation: Secondary | ICD-10-CM | POA: Diagnosis not present

## 2024-08-23 DIAGNOSIS — I2510 Atherosclerotic heart disease of native coronary artery without angina pectoris: Secondary | ICD-10-CM | POA: Diagnosis not present

## 2024-08-23 DIAGNOSIS — G629 Polyneuropathy, unspecified: Secondary | ICD-10-CM

## 2024-08-23 MED ORDER — POLYETHYLENE GLYCOL 3350 17 GM/SCOOP PO POWD
17.0000 g | Freq: Every day | ORAL | 1 refills | Status: AC
  Filled 2024-08-23: qty 510, 30d supply, fill #0

## 2024-08-23 NOTE — Progress Notes (Signed)
 Subjective:  Patient ID: Nancy Vasquez, female    DOB: 04/12/1941  Age: 83 y.o. MRN: [id number]  CC: Medical Management of Chronic Issues (Hard stool/Knee pain)     Discussed the use of AI scribe software for clinical note transcription with the patient, who gave verbal consent to proceed.  History of Present Illness Nancy Vasquez is an 83 year old female with a history of CAD (followed by Dr Levern), ?TIA , left knee osteoarthritis (s/p left TKR),  right THR, hyperlipidemia, hypertension . who presents with concerns about constipation and knee pain.  She experiences constipation with hard stools despite regular use of Activia. Her diet includes fiber, with occasional cheese and pizza consumption, which she acknowledges can contribute to constipation. There is no blood in her stool, and she does not strain during bowel movements.  Knee pain is persistent, particularly in the left knee, with a burning sensation in her legs at night. Previous cortisone injections provided temporary relief. She uses magnesium oil for stiffness and a knee brace during the day, along with a cane for mobility and has noticed improvement with the use of magnesium oil which she rubs on it.  She has a history of a possible TIA or migraine earlier this month, similar to an episode in 2022, with no acute abnormalities found on CT and MRI.  She had presented with transient expressive aphasia lasting 30 minutes.  She is on Plavix  indefinitely.  There was a question of possible complicated migraine per neurology team during her hospitalization and she has been referred to neurology for follow-up.  Her current medications include Crestor  and nitroglycerin , which she has never used. She is cautious with medications due to sensitivity and prefers natural remedies when possible.    Past Medical History:  Diagnosis Date   Arthritis    has had right hip replacement   Coronary artery disease 09/27/2022   Folliculitis  04/10/2023   History of TIA (transient ischemic attack) 07/29/2024    Past Surgical History:  Procedure Laterality Date   ABDOMINAL HYSTERECTOMY     BREAST EXCISIONAL BIOPSY Left    CORONARY PRESSURE/FFR STUDY N/A 03/17/2017   Procedure: Intravascular Pressure Wire/FFR Study;  Surgeon: Rober Levern, MD;  Location: [facility] INVASIVE CV LAB;  Service: Cardiovascular;  Laterality: N/A;  mid LAD   LEFT HEART CATH AND CORONARY ANGIOGRAPHY N/A 03/17/2017   Procedure: Left Heart Cath and Coronary Angiography;  Surgeon: Rober Levern, MD;  Location: [facility] INVASIVE CV LAB;  Service: Cardiovascular;  Laterality: N/A;   TONSILLECTOMY     Removed as a child   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   Procedure: LEFT TOTAL KNEE ARTHROPLASTY;  Surgeon: Addie Cordella Hamilton, MD;  Location: [facility] OR;  Service: Orthopedics;  Laterality: Left;    Family History  Problem Relation Age of Onset   Breast cancer Sister 51       metastatic breast ca    Social History   Socioeconomic History   Marital status: Married    Spouse name: Not on file   Number of children: Not on file   Years of education: Not on file   Highest education level: Not on file  Occupational History   Not on file  Tobacco Use   Smoking status: Never   Smokeless tobacco: Never  Vaping Use   Vaping status: Never Used  Substance and Sexual Activity   Alcohol use: Never   Drug use: Never   Sexual activity: Yes  Other Topics Concern  Not on file  Social History Narrative   Not on file   Social Drivers of Health   Financial Resource Strain: Low Risk  (07/26/2024)   Overall Financial Resource Strain (CARDIA)    Difficulty of Paying Living Expenses: Not hard at all  Food Insecurity: No Food Insecurity (07/29/2024)   Hunger Vital Sign    Worried About Running Out of Food in the Last Year: Never true    Ran Out of Food in the Last Year: Never true  Transportation Needs: No Transportation Needs (07/29/2024)   PRAPARE - Therapist, art (Medical): No    Lack of Transportation (Non-Medical): No  Physical Activity: Insufficiently Active (07/26/2024)   Exercise Vital Sign    Days of Exercise per Week: 3 days    Minutes of Exercise per Session: 30 min  Stress: No Stress Concern Present (07/26/2024)   [employer] of Occupational Health - Occupational Stress Questionnaire    Feeling of Stress: Not at all  Social Connections: Socially Integrated (07/29/2024)   Social Connection and Isolation Panel    Frequency of Communication with Friends and Family: More than three times a week    Frequency of Social Gatherings with Friends and Family: Three times a week    Attends Religious Services: More than 4 times per year    Active Member of Clubs or Organizations: Yes    Attends Banker Meetings: 1 to 4 times per year    Marital Status: Married    Allergies  Allergen Reactions   Ibuprofen Nausea And Vomiting and Other (See Comments)    burns stomach   Oxycodone  Nausea Only    Burns stomach    Outpatient Medications Prior to Visit  Medication Sig Dispense Refill   aspirin  EC 81 MG tablet Take 1 tablet (81 mg total) by mouth daily. Swallow whole. 30 tablet 12   clopidogrel  (PLAVIX ) 75 MG tablet Take 1 tablet (75 mg total) by mouth daily. 90 tablet 3   ergocalciferol  (VITAMIN D2) 1.25 MG (50000 UT) capsule Take 1 capsule (50,000 Units total) by mouth once a week. 12 capsule 3   nitroGLYCERIN  (NITROSTAT ) 0.4 MG SL tablet Place under the tongue at the first sign of attack. Repeat every 5 minutes up to 3 tabs if no relief see medical help. 25 tablet 3   rosuvastatin  (CRESTOR ) 10 MG tablet Take 1 tablet (10 mg total) by mouth daily. 90 tablet 3   acetaminophen -codeine  (TYLENOL  #3) 300-30 MG tablet Take 1 tablet by mouth at bedtime as needed for moderate pain (pain score 4-6). (Patient not taking: Reported on 08/23/2024) 14 tablet 0   No facility-administered medications prior to visit.      ROS Review of Systems  Constitutional:  Negative for activity change and appetite change.  HENT:  Negative for sinus pressure and sore throat.   Respiratory:  Negative for chest tightness, shortness of breath and wheezing.   Cardiovascular:  Negative for chest pain and palpitations.  Gastrointestinal:  Positive for constipation. Negative for abdominal distention and abdominal pain.  Genitourinary: Negative.   Musculoskeletal:  Positive for gait problem.       See HPI  Psychiatric/Behavioral:  Negative for behavioral problems and dysphoric mood.     Objective:  BP 134/76   Pulse 70   Ht 5' 4 (1.626 m)   Wt 151 lb 6.4 oz (68.7 kg)   SpO2 98%   BMI 25.99 kg/m      08/23/2024  3:20 PM 07/30/2024   11:37 AM 07/30/2024    8:35 AM  BP/Weight  Systolic BP 134 115 128  Diastolic BP 76 53 74  Wt. (Lbs) 151.4    BMI 25.99 kg/m2        Physical Exam Constitutional:      Appearance: She is well-developed.  Cardiovascular:     Rate and Rhythm: Normal rate.     Heart sounds: Normal heart sounds. No murmur heard. Pulmonary:     Effort: Pulmonary effort is normal.     Breath sounds: Normal breath sounds. No wheezing or rales.  Chest:     Chest wall: No tenderness.  Abdominal:     General: Bowel sounds are normal. There is no distension.     Palpations: Abdomen is soft. There is no mass.     Tenderness: There is no abdominal tenderness.  Musculoskeletal:        General: Swelling (L knee) and tenderness (on ROM of L knee) present.     Right lower leg: No edema.     Left lower leg: No edema.  Neurological:     Mental Status: She is alert and oriented to person, place, and time.     Gait: Gait abnormal.  Psychiatric:        Mood and Affect: Mood normal.        Latest Ref Rng & Units 07/30/2024    2:15 AM 07/29/2024   11:41 AM 02/24/2024    8:43 AM  CMP  Glucose 70 - 99 mg/dL 89  898  89   BUN 8 - 23 mg/dL 6  8  11    Creatinine 0.44 - 1.00 mg/dL 9.38  9.29  9.18    Sodium 135 - 145 mmol/L 139  140  145   Potassium 3.5 - 5.1 mmol/L 3.6  3.6  4.5   Chloride 98 - 111 mmol/L 109  110  108   CO2 22 - 32 mmol/L 22  23  22    Calcium  8.9 - 10.3 mg/dL 8.7  9.0  89.9   Total Protein 6.5 - 8.1 g/dL  6.8  6.9   Total Bilirubin 0.0 - 1.2 mg/dL  0.7  0.4   Alkaline Phos 38 - 126 U/L  50  63   AST 15 - 41 U/L  15  10   ALT 0 - 44 U/L  13  9     Lipid Panel     Component Value Date/Time   CHOL 123 07/30/2024 0215   CHOL 173 02/24/2024 0843   TRIG 59 07/30/2024 0215   HDL 65 07/30/2024 0215   HDL 78 02/24/2024 0843   CHOLHDL 1.9 07/30/2024 0215   VLDL 12 07/30/2024 0215   LDLCALC 46 07/30/2024 0215   LDLCALC 79 02/24/2024 0843    CBC    Component Value Date/Time   WBC 6.3 07/30/2024 0215   RBC 3.53 (L) 07/30/2024 0215   HGB 10.8 (L) 07/30/2024 0215   HGB 12.8 02/24/2024 0843   HCT 33.7 (L) 07/30/2024 0215   HCT 39.6 02/24/2024 0843   PLT 244 07/30/2024 0215   PLT 281 02/24/2024 0843   MCV 95.5 07/30/2024 0215   MCV 95 02/24/2024 0843   MCH 30.6 07/30/2024 0215   MCHC 32.0 07/30/2024 0215   RDW 12.3 07/30/2024 0215   RDW 11.8 02/24/2024 0843   LYMPHSABS 1.9 07/29/2024 1141   LYMPHSABS 2.2 02/24/2024 0843   MONOABS 0.5 07/29/2024 1141   EOSABS 0.1 07/29/2024 1141  EOSABS 0.2 02/24/2024 0843   BASOSABS 0.0 07/29/2024 1141   BASOSABS 0.0 02/24/2024 0843    Lab Results  Component Value Date   HGBA1C 4.9 07/30/2024       Assessment & Plan Constipation Chronic constipation with hard stools despite increasing fiber and use of Activia. No alarming symptoms or colon cancer indicators. - Recommend Miralax  as needed, mixed with tea, juice, or water . - Advise dietary modifications: increase fiber intake, use whole wheat products, reduce cheese consumption. - Discuss potential use of prunes or prune juice. - Instruct to contact via MyChart if Miralax  is ineffective for consideration of other medications like Amitiza or Linzess  Left knee  osteoarthritis Chronic right knee pain with temporary relief from cortisone injections. Reluctant to pursue further injections. Magnesium oil provides some relief. - Continue use of magnesium oil if beneficial. - Consider use of knee brace for support during activities. - Discuss potential use of glucosamine and chondroitin supplements for joint health. - Avoid prednisone  due to previous adverse effects.  Bilateral lower extremity burning sensation (possible neuropathy) Burning sensation in lower extremities, suggestive of possible neuropathy. Declines gabapentin. - Continue use of magnesium oil for symptom relief.  History of possible transient ischemic attack (TIA) or migraine Possible TIA or migraine with no definitive findings on CT or MRI. Neurology referral pending. -No residual symptoms - Continue Clopidogrel  (Plavix ) indefinitely as prescribed. - Follow up with neurologist as scheduled.   CAD Asymptomatic - Continue Plavix  - Continue statin - Continue follow-up with cardiology-Dr. Levern  General Health Maintenance Discussion about flu vaccination. Hesitant but husband encourages vaccination.        Meds ordered this encounter  Medications   polyethylene glycol powder (GLYCOLAX /MIRALAX ) 17 GM/SCOOP powder    Sig: Take 17 g by mouth daily.    Dispense:  3350 g    Refill:  1    Follow-up: Return in about 6 months (around 02/23/2025) for Chronic medical conditions.       Corrina Sabin, MD, FAAFP. [facility] [place], [place] [phone]   08/23/2024, 5:16 PM

## 2024-08-23 NOTE — Patient Instructions (Signed)
 VISIT SUMMARY:  Today, we discussed your concerns about constipation and knee pain. We also reviewed your history of a possible mini-stroke or migraine and your current medications.  YOUR PLAN:  -CONSTIPATION: Constipation means having difficulty with bowel movements, often resulting in hard stools. We recommend using Miralax  as needed, which you can mix with tea, juice, or water . Additionally, try to increase your fiber intake, use whole wheat products, and reduce cheese consumption. You might also consider using prunes or prune juice. If Miralax  does not help, please contact us  through MyChart.  -CHRONIC RIGHT KNEE PAIN: Chronic knee pain is ongoing discomfort in the knee. You can continue using magnesium oil if it helps and consider wearing a knee brace for support during activities. We also discussed the potential benefits of glucosamine and chondroitin supplements for joint health. Avoid using prednisone  due to previous adverse effects.  -BILATERAL LOWER EXTREMITY BURNING SENSATION (POSSIBLE NEUROPATHY): Neuropathy is a condition that causes burning or tingling sensations in the legs. You can continue using magnesium oil for relief.  -HISTORY OF POSSIBLE TRANSIENT ISCHEMIC ATTACK (TIA) OR MIGRAINE: A TIA or mini-stroke is a temporary period of symptoms similar to those of a stroke. Continue taking Clopidogrel  (Plavix ) as prescribed and follow up with the neurologist as scheduled.  -GENERAL HEALTH MAINTENANCE: We discussed the importance of getting a flu vaccination. Although you are hesitant, your husband encourages you to get vaccinated.  INSTRUCTIONS:  Please schedule your next primary care follow-up appointment on September 2.

## 2024-08-26 ENCOUNTER — Other Ambulatory Visit: Payer: Self-pay

## 2024-09-09 ENCOUNTER — Inpatient Hospital Stay: Admitting: Neurology

## 2024-09-09 ENCOUNTER — Encounter: Payer: Self-pay | Admitting: Neurology

## 2024-09-09 NOTE — Progress Notes (Deleted)
 [facility]    Provider:  Dr Ines Requesting Provider: Jerri Pfeiffer, MD Primary Care Provider:  Delbert Clam, MD  CC:  ***  HPI:  Nancy Vasquez is a 83 y.o. female here as requested by Jerri Pfeiffer, MD for evaluation of migraine with aura vs TIA. has Osteoarthritis of left knee; Coronary artery disease; HLD (hyperlipidemia); TIA (transient ischemic attack); Candida vaginitis; Aphasia; and History of TIA (transient ischemic attack) on their problem list.   Reviewed notes, labs and imaging from outside physicians, which showed ***  Review of Systems: Patient complains of symptoms per HPI as well as the following symptoms ***. Pertinent negatives and positives per HPI. All others negative.   Social History   Socioeconomic History   Marital status: Married    Spouse name: Not on file   Number of children: Not on file   Years of education: Not on file   Highest education level: Not on file  Occupational History   Not on file  Tobacco Use   Smoking status: Never   Smokeless tobacco: Never  Vaping Use   Vaping status: Never Used  Substance and Sexual Activity   Alcohol use: Never   Drug use: Never   Sexual activity: Yes  Other Topics Concern   Not on file  Social History Narrative   Not on file   Social Drivers of Health   Financial Resource Strain: Low Risk  (07/26/2024)   Overall Financial Resource Strain (CARDIA)    Difficulty of Paying Living Expenses: Not hard at all  Food Insecurity: No Food Insecurity (07/29/2024)   Hunger Vital Sign    Worried About Running Out of Food in the Last Year: Never true    Ran Out of Food in the Last Year: Never true  Transportation Needs: No Transportation Needs (07/29/2024)   PRAPARE - Administrator, Civil Service (Medical): No    Lack of Transportation (Non-Medical): No  Physical Activity: Insufficiently Active (07/26/2024)   Exercise Vital Sign    Days of Exercise per Week: 3 days    Minutes  of Exercise per Session: 30 min  Stress: No Stress Concern Present (07/26/2024)   [employer] of Occupational Health - Occupational Stress Questionnaire    Feeling of Stress: Not at all  Social Connections: Socially Integrated (07/29/2024)   Social Connection and Isolation Panel    Frequency of Communication with Friends and Family: More than three times a week    Frequency of Social Gatherings with Friends and Family: Three times a week    Attends Religious Services: More than 4 times per year    Active Member of Clubs or Organizations: Yes    Attends Banker Meetings: 1 to 4 times per year    Marital Status: Married  Catering manager Violence: Not At Risk (07/29/2024)   Humiliation, Afraid, Rape, and Kick questionnaire    Fear of Current or Ex-Partner: No    Emotionally Abused: No    Physically Abused: No    Sexually Abused: No    Family History  Problem Relation Age of Onset   Breast cancer Sister 64       metastatic breast ca    Past Medical History:  Diagnosis Date   Arthritis    has had right hip replacement   Coronary artery disease 09/27/2022   Folliculitis 04/10/2023   History of TIA (transient ischemic attack) 07/29/2024    Patient Active Problem List   Diagnosis Date Noted  Aphasia 07/29/2024   History of TIA (transient ischemic attack) 07/29/2024   Candida vaginitis 04/23/2023   TIA (transient ischemic attack) 03/17/2023   HLD (hyperlipidemia) 11/06/2022   Coronary artery disease 09/27/2022   Osteoarthritis of left knee 04/19/2020    Past Surgical History:  Procedure Laterality Date   ABDOMINAL HYSTERECTOMY     BREAST EXCISIONAL BIOPSY Left    CORONARY PRESSURE/FFR STUDY N/A 03/17/2017   Procedure: Intravascular Pressure Wire/FFR Study;  Surgeon: Rober Chroman, MD;  Location: [facility] INVASIVE CV LAB;  Service: Cardiovascular;  Laterality: N/A;  mid LAD   LEFT HEART CATH AND CORONARY ANGIOGRAPHY N/A 03/17/2017   Procedure: Left Heart Cath and  Coronary Angiography;  Surgeon: Rober Chroman, MD;  Location: [facility] INVASIVE CV LAB;  Service: Cardiovascular;  Laterality: N/A;   TONSILLECTOMY     Removed as a child   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   Procedure: LEFT TOTAL KNEE ARTHROPLASTY;  Surgeon: Addie Cordella Hamilton, MD;  Location: [facility] OR;  Service: Orthopedics;  Laterality: Left;    Current Outpatient Medications  Medication Sig Dispense Refill   acetaminophen -codeine  (TYLENOL  #3) 300-30 MG tablet Take 1 tablet by mouth at bedtime as needed for moderate pain (pain score 4-6). (Patient not taking: Reported on 08/23/2024) 14 tablet 0   aspirin  EC 81 MG tablet Take 1 tablet (81 mg total) by mouth daily. Swallow whole. 30 tablet 12   clopidogrel  (PLAVIX ) 75 MG tablet Take 1 tablet (75 mg total) by mouth daily. 90 tablet 3   ergocalciferol  (VITAMIN D2) 1.25 MG (50000 UT) capsule Take 1 capsule (50,000 Units total) by mouth once a week. 12 capsule 3   nitroGLYCERIN  (NITROSTAT ) 0.4 MG SL tablet Place under the tongue at the first sign of attack. Repeat every 5 minutes up to 3 tabs if no relief see medical help. 25 tablet 3   polyethylene glycol powder (GLYCOLAX /MIRALAX ) 17 GM/SCOOP powder Take 17 g by mouth daily. 3350 g 1   rosuvastatin  (CRESTOR ) 10 MG tablet Take 1 tablet (10 mg total) by mouth daily. 90 tablet 3   No current facility-administered medications for this visit.    Allergies as of 09/09/2024 - Review Complete 08/23/2024  Allergen Reaction Noted   Ibuprofen Nausea And Vomiting and Other (See Comments) 09/10/2015   Oxycodone  Nausea Only 09/28/2022    Vitals: There were no vitals taken for this visit. Last Weight:  Wt Readings from Last 1 Encounters:  08/23/24 151 lb 6.4 oz (68.7 kg)   Last Height:   Ht Readings from Last 1 Encounters:  08/23/24 5' 4 (1.626 m)     Physical exam: Exam: Gen: NAD, conversant, well nourised, obese, well groomed                     CV: RRR, no MRG. No Carotid Bruits. No peripheral  edema, warm, nontender Eyes: Conjunctivae clear without exudates or hemorrhage  Neuro: Detailed Neurologic Exam  Speech:    Speech is normal; fluent and spontaneous with normal comprehension.  Cognition:    The patient is oriented to person, place, and time;     recent and remote memory intact;     language fluent;     normal attention, concentration,     fund of knowledge Cranial Nerves:    The pupils are equal, round, and reactive to light. The fundi are normal and spontaneous venous pulsations are present. Visual fields are full to finger confrontation. Extraocular movements are intact. Trigeminal sensation is intact and  the muscles of mastication are normal. The face is symmetric. The palate elevates in the midline. Hearing intact. Voice is normal. Shoulder shrug is normal. The tongue has normal motion without fasciculations.   Coordination:    Normal finger to nose and heel to shin. Normal rapid alternating movements.   Gait:    Heel-toe and tandem gait are normal.   Motor Observation:    No asymmetry, no atrophy, and no involuntary movements noted. Tone:    Normal muscle tone.    Posture:    Posture is normal. normal erect    Strength:    Strength is V/V in the upper and lower limbs.      Sensation: intact to LT     Reflex Exam:  DTR's:    Deep tendon reflexes in the upper and lower extremities are normal bilaterally.   Toes:    The toes are downgoing bilaterally.   Clonus:    Clonus is absent.    Assessment/Plan:    No orders of the defined types were placed in this encounter.  No orders of the defined types were placed in this encounter.   Cc: Jerri Pfeiffer, MD,  Delbert Clam, MD  Onetha Epp, MD  [facility] [address] [place], [place] [address]  Phone [phone] Fax [phone]

## 2024-09-12 ENCOUNTER — Other Ambulatory Visit: Payer: Self-pay

## 2024-09-12 ENCOUNTER — Other Ambulatory Visit (HOSPITAL_COMMUNITY): Payer: Self-pay

## 2024-09-12 ENCOUNTER — Other Ambulatory Visit: Payer: Self-pay | Admitting: Family Medicine

## 2024-09-12 MED ORDER — ERGOCALCIFEROL 1.25 MG (50000 UT) PO CAPS
50000.0000 [IU] | ORAL_CAPSULE | ORAL | 3 refills | Status: AC
  Filled 2024-09-12 – 2024-10-26 (×3): qty 12, 84d supply, fill #0
  Filled 2024-12-30: qty 12, 84d supply, fill #1

## 2024-09-21 ENCOUNTER — Other Ambulatory Visit (HOSPITAL_COMMUNITY): Payer: Self-pay

## 2024-09-21 ENCOUNTER — Other Ambulatory Visit: Payer: Self-pay

## 2024-09-27 ENCOUNTER — Ambulatory Visit (INDEPENDENT_AMBULATORY_CARE_PROVIDER_SITE_OTHER): Admitting: Physician Assistant

## 2024-09-27 DIAGNOSIS — G8929 Other chronic pain: Secondary | ICD-10-CM | POA: Diagnosis not present

## 2024-09-27 DIAGNOSIS — M25562 Pain in left knee: Secondary | ICD-10-CM | POA: Diagnosis not present

## 2024-09-27 DIAGNOSIS — M1612 Unilateral primary osteoarthritis, left hip: Secondary | ICD-10-CM

## 2024-09-27 NOTE — Progress Notes (Unsigned)
   Office Visit Note   Patient: Nancy Vasquez           Date of Birth: 12-03-1941           MRN: [id number] Visit Date: 09/27/2024              Requested by: Delbert Clam, MD [address] [place],  [place] [address] PCP: Delbert Clam, MD   Assessment & Plan: Visit Diagnoses: No diagnosis found.  Plan: ***  Follow-Up Instructions: No follow-ups on file.   Orders:  No orders of the defined types were placed in this encounter.  No orders of the defined types were placed in this encounter.     Procedures: No procedures performed   Clinical Data: No additional findings.   Subjective: Chief Complaint  Patient presents with   Left Knee - Pain    HPI  Review of Systems   Objective: Vital Signs: There were no vitals taken for this visit.  Physical Exam  Ortho Exam  Specialty Comments:  No specialty comments available.  Imaging: No results found.   PMFS History: Patient Active Problem List   Diagnosis Date Noted   Aphasia 07/29/2024   History of TIA (transient ischemic attack) 07/29/2024   Candida vaginitis 04/23/2023   TIA (transient ischemic attack) 03/17/2023   HLD (hyperlipidemia) 11/06/2022   Coronary artery disease 09/27/2022   Osteoarthritis of left knee 04/19/2020   Past Medical History:  Diagnosis Date   Arthritis    has had right hip replacement   Coronary artery disease 09/27/2022   Folliculitis 04/10/2023   History of TIA (transient ischemic attack) 07/29/2024    Family History  Problem Relation Age of Onset   Breast cancer Sister 47       metastatic breast ca    Past Surgical History:  Procedure Laterality Date   ABDOMINAL HYSTERECTOMY     BREAST EXCISIONAL BIOPSY Left    CORONARY PRESSURE/FFR STUDY N/A 03/17/2017   Procedure: Intravascular Pressure Wire/FFR Study;  Surgeon: Rober Chroman, MD;  Location: [facility] INVASIVE CV LAB;  Service: Cardiovascular;  Laterality: N/A;  mid LAD   LEFT HEART CATH AND  CORONARY ANGIOGRAPHY N/A 03/17/2017   Procedure: Left Heart Cath and Coronary Angiography;  Surgeon: Rober Chroman, MD;  Location: [facility] INVASIVE CV LAB;  Service: Cardiovascular;  Laterality: N/A;   TONSILLECTOMY     Removed as a child   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   Procedure: LEFT TOTAL KNEE ARTHROPLASTY;  Surgeon: Addie Cordella Hamilton, MD;  Location: [facility] OR;  Service: Orthopedics;  Laterality: Left;   Social History   Occupational History   Not on file  Tobacco Use   Smoking status: Never   Smokeless tobacco: Never  Vaping Use   Vaping status: Never Used  Substance and Sexual Activity   Alcohol use: Never   Drug use: Never   Sexual activity: Yes

## 2024-09-29 ENCOUNTER — Ambulatory Visit: Payer: Self-pay

## 2024-09-29 NOTE — Telephone Encounter (Signed)
 FYI Only or Action Required?: Action required by provider: request for appointment.  Patient was last seen in primary care on 08/23/2024 by Newlin, Enobong, MD.  Called Nurse Triage reporting Neck Pain.  Symptoms began several weeks ago.  Interventions attempted: Nothing.  Symptoms are: gradually worsening.  Triage Disposition: See PCP When Office is Open (Within 3 Days)  Patient/caregiver understands and will follow disposition?: Unsure    Copied from CRM #[id number]. Topic: Clinical - Red Word Triage >> Sep 29, 2024 12:14 PM Wess RAMAN wrote: Red Word that prompted transfer to Nurse Triage: Lymph node on left side of neck and size has increased. Pain in head from her neck Reason for Disposition  Neck pain present > 2 weeks  Answer Assessment - Initial Assessment Questions Patient denies stiffness in neck. Patient states she has not taking meds for symptoms. Patient requesting an appointment on 10/29. Patient also requesting if it is available she would like to be scheduled on 10/6 or 10/7 due to her husband having to drive her to her appointments. Patient requesting a call back regarding her request.   1. ONSET: When did the pain begin?      Weeks  2. LOCATION: Where does it hurt?      L side of neck  3. PATTERN Does the pain come and go, or has it been constant since it started?      Comes and goes  4. SEVERITY: How bad is the pain?  (Scale 0-10; or none or slight stiffness, mild, moderate, severe)     4/10 5. RADIATION: Does the pain go anywhere else, shoot into your arms?     Denies  6. CORD SYMPTOMS: Any weakness or numbness of the arms or legs?     Has a hx of osteoarthritis  7. CAUSE: What do you think is causing the neck pain?     Has a swollen lymph node on area  8. OTHER SYMPTOMS: Do you have any other symptoms? (e.g., headache, fever, chest pain, difficulty breathing, neck swelling)     Denies  Protocols used: Neck Pain or Stiffness-A-AH

## 2024-10-04 ENCOUNTER — Telehealth: Payer: Self-pay | Admitting: Orthopedic Surgery

## 2024-10-04 NOTE — Telephone Encounter (Signed)
 Received vm from patient requesting xrays. IC,lmvm for patient to [facility]. I need to know the dates of service being requested and once received, I will send a message to our xray department.

## 2024-10-05 ENCOUNTER — Other Ambulatory Visit: Payer: Self-pay

## 2024-10-05 ENCOUNTER — Other Ambulatory Visit (HOSPITAL_COMMUNITY): Payer: Self-pay

## 2024-10-05 ENCOUNTER — Other Ambulatory Visit: Payer: Self-pay | Admitting: Pharmacist

## 2024-10-05 DIAGNOSIS — I10 Essential (primary) hypertension: Secondary | ICD-10-CM | POA: Diagnosis not present

## 2024-10-05 DIAGNOSIS — E782 Mixed hyperlipidemia: Secondary | ICD-10-CM | POA: Diagnosis not present

## 2024-10-05 DIAGNOSIS — I2510 Atherosclerotic heart disease of native coronary artery without angina pectoris: Secondary | ICD-10-CM | POA: Diagnosis not present

## 2024-10-05 DIAGNOSIS — M150 Primary generalized (osteo)arthritis: Secondary | ICD-10-CM | POA: Diagnosis not present

## 2024-10-05 NOTE — Progress Notes (Signed)
 Pharmacy Quality Measure Review  This patient is appearing on a report for the adherence measure for cholesterol (statin) medications this calendar year.   Medication: rosuvastatin   Last fill date: 07/05/2024 for 90 day supply. Refill due today.  Contacted pharmacy to facilitate refills. Also called the patient to remind her of her refills and left a VM instructing her to pick these up. Reminder set for next week to make sure she got this.  Herlene Fleeta Morris, PharmD, JAQUELINE, CPP Clinical Pharmacist [facility] [phone]

## 2024-10-10 DIAGNOSIS — M25552 Pain in left hip: Secondary | ICD-10-CM | POA: Diagnosis not present

## 2024-10-10 DIAGNOSIS — M1990 Unspecified osteoarthritis, unspecified site: Secondary | ICD-10-CM | POA: Diagnosis not present

## 2024-10-10 DIAGNOSIS — Z96652 Presence of left artificial knee joint: Secondary | ICD-10-CM | POA: Diagnosis not present

## 2024-10-10 DIAGNOSIS — M1612 Unilateral primary osteoarthritis, left hip: Secondary | ICD-10-CM | POA: Diagnosis not present

## 2024-10-10 DIAGNOSIS — Z471 Aftercare following joint replacement surgery: Secondary | ICD-10-CM | POA: Diagnosis not present

## 2024-10-12 ENCOUNTER — Other Ambulatory Visit (HOSPITAL_COMMUNITY): Payer: Self-pay

## 2024-10-12 ENCOUNTER — Other Ambulatory Visit: Payer: Self-pay | Admitting: Pharmacist

## 2024-10-12 ENCOUNTER — Telehealth: Payer: Self-pay

## 2024-10-12 DIAGNOSIS — E782 Mixed hyperlipidemia: Secondary | ICD-10-CM | POA: Diagnosis not present

## 2024-10-12 DIAGNOSIS — I2510 Atherosclerotic heart disease of native coronary artery without angina pectoris: Secondary | ICD-10-CM | POA: Diagnosis not present

## 2024-10-12 DIAGNOSIS — R0789 Other chest pain: Secondary | ICD-10-CM | POA: Diagnosis not present

## 2024-10-12 DIAGNOSIS — I10 Essential (primary) hypertension: Secondary | ICD-10-CM | POA: Diagnosis not present

## 2024-10-12 DIAGNOSIS — K118 Other diseases of salivary glands: Secondary | ICD-10-CM | POA: Diagnosis not present

## 2024-10-12 NOTE — Progress Notes (Signed)
 Pharmacy Quality Measure Review  This patient is appearing on a report for the adherence measure for cholesterol (statin) medications this calendar year.   Medication: rosuvastatin   Last fill date: 10/05/24 for 90 day supply.   No follow-up needed at this time.   Herlene Fleeta Morris, PharmD, JAQUELINE, CPP Clinical Pharmacist [facility] [phone]

## 2024-10-12 NOTE — Telephone Encounter (Signed)
 Copied from CRM [id number]. Topic: General - Other >> Oct 12, 2024 11:47 AM Mia F wrote: Reason for CRM: Pt called asking about the dr who signed off of her clinical outreach note this morning. She says that is not who she saw.

## 2024-10-26 ENCOUNTER — Other Ambulatory Visit (HOSPITAL_COMMUNITY): Payer: Self-pay

## 2024-10-26 ENCOUNTER — Other Ambulatory Visit (HOSPITAL_BASED_OUTPATIENT_CLINIC_OR_DEPARTMENT_OTHER): Payer: Self-pay

## 2024-10-26 ENCOUNTER — Ambulatory Visit (HOSPITAL_BASED_OUTPATIENT_CLINIC_OR_DEPARTMENT_OTHER)
Admission: RE | Admit: 2024-10-26 | Discharge: 2024-10-26 | Disposition: A | Discharge status: Home / Self Care | Source: Ambulatory Visit | Attending: Family Medicine | Admitting: Family Medicine

## 2024-10-26 ENCOUNTER — Other Ambulatory Visit: Payer: Medicare Other

## 2024-10-26 DIAGNOSIS — Z780 Asymptomatic menopausal state: Secondary | ICD-10-CM | POA: Insufficient documentation

## 2024-10-26 DIAGNOSIS — M8589 Other specified disorders of bone density and structure, multiple sites: Secondary | ICD-10-CM | POA: Diagnosis not present

## 2024-10-27 ENCOUNTER — Ambulatory Visit: Payer: Self-pay | Admitting: Family Medicine

## 2024-10-27 ENCOUNTER — Other Ambulatory Visit: Payer: Self-pay

## 2024-10-31 ENCOUNTER — Encounter: Payer: Self-pay | Admitting: Radiology

## 2024-11-09 ENCOUNTER — Encounter: Payer: Self-pay | Admitting: Family Medicine

## 2024-11-09 ENCOUNTER — Ambulatory Visit: Admitting: Family Medicine

## 2024-11-09 VITALS — BP 109/63 | HR 62 | Ht 64.0 in | Wt 151.0 lb

## 2024-11-09 DIAGNOSIS — R519 Headache, unspecified: Secondary | ICD-10-CM | POA: Diagnosis not present

## 2024-11-09 DIAGNOSIS — S0993XA Unspecified injury of face, initial encounter: Secondary | ICD-10-CM

## 2024-11-14 ENCOUNTER — Encounter: Payer: Self-pay | Admitting: Family Medicine

## 2024-11-14 NOTE — Progress Notes (Signed)
 Established Patient Office Visit  Subjective    Patient ID: Nancy Vasquez, female    DOB: 09-Dec-1941  Age: 83 y.o. MRN: [id number]  CC:  Chief Complaint  Patient presents with   head pain back of head    Pt reports she had dentures recently and is now having pain in the back of her head.     HPI Nancy Vasquez presents with complaint of having gotten teeth pulled and dentures recently and known having intermittent headaches.   Outpatient Encounter Medications as of 11/09/2024  Medication Sig   ergocalciferol  (VITAMIN D2) 1.25 MG (50000 UT) capsule Take 1 capsule (50,000 Units total) by mouth once a week.   polyethylene glycol powder (GLYCOLAX /MIRALAX ) 17 GM/SCOOP powder Take 17 g by mouth daily.   rosuvastatin  (CRESTOR ) 10 MG tablet Take 1 tablet (10 mg total) by mouth daily.   acetaminophen -codeine  (TYLENOL  #3) 300-30 MG tablet Take 1 tablet by mouth at bedtime as needed for moderate pain (pain score 4-6). (Patient not taking: Reported on 11/09/2024)   aspirin  EC 81 MG tablet Take 1 tablet (81 mg total) by mouth daily. Swallow whole. (Patient not taking: Reported on 11/09/2024)   clopidogrel  (PLAVIX ) 75 MG tablet Take 1 tablet (75 mg total) by mouth daily. (Patient not taking: Reported on 11/09/2024)   nitroGLYCERIN  (NITROSTAT ) 0.4 MG SL tablet Place under the tongue at the first sign of attack. Repeat every 5 minutes up to 3 tabs if no relief see medical help. (Patient not taking: Reported on 11/09/2024)   No facility-administered encounter medications on file as of 11/09/2024.    Past Medical History:  Diagnosis Date   Arthritis    has had right hip replacement   Coronary artery disease 09/27/2022   Folliculitis 04/10/2023   History of TIA (transient ischemic attack) 07/29/2024    Past Surgical History:  Procedure Laterality Date   ABDOMINAL HYSTERECTOMY     BREAST EXCISIONAL BIOPSY Left    CORONARY PRESSURE/FFR STUDY N/A 03/17/2017   Procedure: Intravascular  Pressure Wire/FFR Study;  Surgeon: Rober Chroman, MD;  Location: [facility] INVASIVE CV LAB;  Service: Cardiovascular;  Laterality: N/A;  mid LAD   LEFT HEART CATH AND CORONARY ANGIOGRAPHY N/A 03/17/2017   Procedure: Left Heart Cath and Coronary Angiography;  Surgeon: Rober Chroman, MD;  Location: [facility] INVASIVE CV LAB;  Service: Cardiovascular;  Laterality: N/A;   TONSILLECTOMY     Removed as a child   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   Procedure: LEFT TOTAL KNEE ARTHROPLASTY;  Surgeon: Addie Cordella Hamilton, MD;  Location: [facility] OR;  Service: Orthopedics;  Laterality: Left;    Family History  Problem Relation Age of Onset   Breast cancer Sister 56       metastatic breast ca    Social History   Socioeconomic History   Marital status: Married    Spouse name: Not on file   Number of children: Not on file   Years of education: Not on file   Highest education level: Not on file  Occupational History   Not on file  Tobacco Use   Smoking status: Never   Smokeless tobacco: Never  Vaping Use   Vaping status: Never Used  Substance and Sexual Activity   Alcohol use: Never   Drug use: Never   Sexual activity: Yes  Other Topics Concern   Not on file  Social History Narrative   Not on file   Social Drivers of Health   Financial Resource Strain: Low  Risk  (10/10/2024)   Received from [facility]   Overall Financial Resource Strain (CARDIA)    Difficulty of Paying Living Expenses: Not hard at all  Food Insecurity: No Food Insecurity (10/10/2024)   Received from [facility]   Hunger Vital Sign    Within the past 12 months, you worried that your food would run out before you got the money to buy more.: Never true    Within the past 12 months, the food you bought just didn't last and you didn't have money to get more.: Never true  Transportation Needs: No Transportation Needs (10/10/2024)   Received from [facility] -  Transportation    In the past 12 months, has lack of transportation kept you from medical appointments or from getting medications?: No    Lack of Transportation (Non-Medical): No  Physical Activity: Insufficiently Active (07/26/2024)   Exercise Vital Sign    Days of Exercise per Week: 3 days    Minutes of Exercise per Session: 30 min  Stress: No Stress Concern Present (07/26/2024)   [employer] of Occupational Health - Occupational Stress Questionnaire    Feeling of Stress: Not at all  Social Connections: Socially Integrated (07/29/2024)   Social Connection and Isolation Panel    Frequency of Communication with Friends and Family: More than three times a week    Frequency of Social Gatherings with Friends and Family: Three times a week    Attends Religious Services: More than 4 times per year    Active Member of Clubs or Organizations: Yes    Attends Banker Meetings: 1 to 4 times per year    Marital Status: Married  Catering Manager Violence: Not At Risk (07/29/2024)   Humiliation, Afraid, Rape, and Kick questionnaire    Fear of Current or Ex-Partner: No    Emotionally Abused: No    Physically Abused: No    Sexually Abused: No    Review of Systems  Neurological:  Positive for headaches.  All other systems reviewed and are negative.       Objective    BP 109/63   Pulse 62   Ht 5' 4 (1.626 m)   Wt 151 lb (68.5 kg)   SpO2 98%   BMI 25.92 kg/m   Physical Exam Vitals and nursing note reviewed.  Constitutional:      General: She is not in acute distress. HENT:     Head: Normocephalic and atraumatic.     Mouth/Throat:     Dentition: Dental tenderness present.  Cardiovascular:     Rate and Rhythm: Normal rate and regular rhythm.  Pulmonary:     Effort: Pulmonary effort is normal.     Breath sounds: Normal breath sounds.  Abdominal:     Palpations: Abdomen is soft.     Tenderness: There is no abdominal tenderness.  Musculoskeletal:     Comments:  Utilizing wheelchair for stability  Neurological:     General: No focal deficit present.     Mental Status: She is alert and oriented to person, place, and time.         Assessment & Plan:   Nonintractable headache, unspecified chronicity pattern, unspecified headache type  Pain due to dental trauma    Neuro exam without acute findings. Patient to utilize tylenol  prn for sx until she can return to dental professional  for further eval and management.   Return if symptoms worsen or fail to  improve.   Tanda Raguel SQUIBB, MD

## 2024-11-16 ENCOUNTER — Other Ambulatory Visit (HOSPITAL_COMMUNITY): Payer: Self-pay | Admitting: Orthopedic Surgery

## 2024-11-16 DIAGNOSIS — Z96652 Presence of left artificial knee joint: Secondary | ICD-10-CM

## 2024-11-18 ENCOUNTER — Encounter (HOSPITAL_COMMUNITY)
Admission: RE | Admit: 2024-11-18 | Discharge: 2024-11-18 | Disposition: A | Discharge status: Home / Self Care | Source: Ambulatory Visit | Attending: Orthopedic Surgery | Admitting: Orthopedic Surgery

## 2024-11-18 DIAGNOSIS — Z96652 Presence of left artificial knee joint: Secondary | ICD-10-CM | POA: Insufficient documentation

## 2024-11-18 DIAGNOSIS — Z0189 Encounter for other specified special examinations: Secondary | ICD-10-CM | POA: Diagnosis not present

## 2024-11-18 MED ORDER — TECHNETIUM TC 99M MEDRONATE IV KIT
20.0000 | PACK | Freq: Once | INTRAVENOUS | Status: AC | PRN
  Administered 2024-11-18: 21.4 via INTRAVENOUS

## 2024-11-22 ENCOUNTER — Emergency Department (HOSPITAL_COMMUNITY)
Admission: EM | Admit: 2024-11-22 | Discharge: 2024-11-22 | Disposition: A | Discharge status: Home / Self Care | Attending: Emergency Medicine | Admitting: Emergency Medicine

## 2024-11-22 ENCOUNTER — Emergency Department (HOSPITAL_COMMUNITY)

## 2024-11-22 ENCOUNTER — Encounter (HOSPITAL_COMMUNITY): Payer: Self-pay | Admitting: Emergency Medicine

## 2024-11-22 DIAGNOSIS — Z7982 Long term (current) use of aspirin: Secondary | ICD-10-CM | POA: Diagnosis not present

## 2024-11-22 DIAGNOSIS — H1132 Conjunctival hemorrhage, left eye: Secondary | ICD-10-CM | POA: Insufficient documentation

## 2024-11-22 DIAGNOSIS — R519 Headache, unspecified: Secondary | ICD-10-CM | POA: Diagnosis not present

## 2024-11-22 DIAGNOSIS — G44209 Tension-type headache, unspecified, not intractable: Secondary | ICD-10-CM | POA: Diagnosis not present

## 2024-11-22 DIAGNOSIS — R9082 White matter disease, unspecified: Secondary | ICD-10-CM | POA: Diagnosis not present

## 2024-11-22 NOTE — ED Provider Triage Note (Signed)
 Emergency Medicine Provider Triage Evaluation Note  Nancy Vasquez , a 83 y.o. female  was evaluated in triage.  Pt complains of red eye. Pt noticed that her L eye was red this AM.  Denies any trauma.  Endorse occasional pain to the back of her head, this is not new.  Denies vision changes, or teary eye, no double vision.  Had dental work done last week, denies any active dental pain.  Does wear prescription glasses.    Review of Systems  Positive: As above Negative: As above  Physical Exam  BP 132/84 (BP Location: Right Arm)   Pulse (!) 58   Temp 98 F (36.7 C) (Oral)   Resp 14   SpO2 100%  Gen:   Awake, no distress   Resp:  Normal effort  MSK:   Moves extremities without difficulty  Other:  L conjunctiva is injected, likely subconjunctival hemorrhage  Medical Decision Making  Medically screening exam initiated at 8:58 PM.  Appropriate orders placed.  Nancy Vasquez was informed that the remainder of the evaluation will be completed by another provider, this initial triage assessment does not replace that evaluation, and the importance of remaining in the ED until their evaluation is complete.  Likely subconjunctival hemorrhage.  Less likely temporal arteritis, or cluster headache.    Nancy Colon, PA-C 11/22/24 2101

## 2024-11-22 NOTE — ED Notes (Signed)
 Patient transported to CT

## 2024-11-22 NOTE — ED Notes (Signed)
 Pt verbalized understanding of discharge instructions.

## 2024-11-22 NOTE — ED Provider Notes (Signed)
 [place] EMERGENCY DEPARTMENT AT [facility] Provider Note   CSN: [id number] Arrival date & time: 11/22/24  2039     Patient presents with: Conjunctivitis   Nancy Vasquez is a 83 y.o. female.   83 yo F with a chief complaints of a change in color to the white part of her left eye.  This was noticed this evening about 8 PM.  Denies any pain denies change in vision.  She denies trauma.  Got her hair done this afternoon.  She denies any obvious chemical exposure to the eye.  She has been suffering from left-sided headaches.  Usually improves with massage.  Is having some pain to a joint that she had replaced in the past.  Otherwise denies one-sided numbness or weakness or difficulty with speech or swallowing.   Conjunctivitis       Prior to Admission medications   Medication Sig Start Date End Date Taking? Authorizing Provider  acetaminophen -codeine  (TYLENOL  #3) 300-30 MG tablet Take 1 tablet by mouth at bedtime as needed for moderate pain (pain score 4-6). Patient not taking: Reported on 11/09/2024 08/07/24   Magnant, Carlin CROME, PA-C  aspirin  EC 81 MG tablet Take 1 tablet (81 mg total) by mouth daily. Swallow whole. Patient not taking: Reported on 11/09/2024 07/30/24   Elpidio Reyes DEL, MD  clopidogrel  (PLAVIX ) 75 MG tablet Take 1 tablet (75 mg total) by mouth daily. Patient not taking: Reported on 11/09/2024 01/06/24     ergocalciferol  (VITAMIN D2) 1.25 MG (50000 UT) capsule Take 1 capsule (50,000 Units total) by mouth once a week. 09/12/24   Levern Hutching, MD  nitroGLYCERIN  (NITROSTAT ) 0.4 MG SL tablet Place under the tongue at the first sign of attack. Repeat every 5 minutes up to 3 tabs if no relief see medical help. Patient not taking: Reported on 11/09/2024 08/20/23   Levern Hutching, MD  polyethylene glycol powder (GLYCOLAX /MIRALAX ) 17 GM/SCOOP powder Take 17 g by mouth daily. 08/23/24   Newlin, Enobong, MD  rosuvastatin  (CRESTOR ) 10 MG tablet Take 1 tablet (10 mg  total) by mouth daily. 07/05/24       Allergies: Ibuprofen and Oxycodone     Review of Systems  Updated Vital Signs BP 132/84 (BP Location: Right Arm)   Pulse (!) 58   Temp 98 F (36.7 C) (Oral)   Resp 14   SpO2 100%   Physical Exam Vitals and nursing note reviewed.  Constitutional:      General: She is not in acute distress.    Appearance: She is well-developed. She is not diaphoretic.  HENT:     Head: Normocephalic and atraumatic.  Eyes:     Pupils: Pupils are equal, round, and reactive to light.     Comments: Subconjunctival hemorrhage noted to the left eye.  There is no obvious drainage.  No chemosis.  Extraocular motion intact.  No appreciable foreign body.  Cataract  Cardiovascular:     Rate and Rhythm: Normal rate and regular rhythm.     Heart sounds: No murmur heard.    No friction rub. No gallop.  Pulmonary:     Effort: Pulmonary effort is normal.     Breath sounds: No wheezing or rales.  Abdominal:     General: There is no distension.     Palpations: Abdomen is soft.     Tenderness: There is no abdominal tenderness.  Musculoskeletal:        General: No tenderness.     Cervical back: Normal range of  motion and neck supple.  Skin:    General: Skin is warm and dry.  Neurological:     Mental Status: She is alert and oriented to person, place, and time.  Psychiatric:        Behavior: Behavior normal.     (all labs ordered are listed, but only abnormal results are displayed) Labs Reviewed - No data to display  EKG: None  Radiology: CT Head Wo Contrast Result Date: 11/22/2024 EXAM: CT HEAD WITHOUT CONTRAST 11/22/2024 09:12:40 PM TECHNIQUE: CT of the head was performed without the administration of intravenous contrast. Automated exposure control, iterative reconstruction, and/or weight based adjustment of the mA/kV was utilized to reduce the radiation dose to as low as reasonably achievable. COMPARISON: 08/19/2024 CLINICAL HISTORY: Headache, tension-type  FINDINGS: BRAIN AND VENTRICLES: No acute hemorrhage. No evidence of acute infarct. No hydrocephalus. No extra-axial collection. No mass effect or midline shift. 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 ORBITS: No acute abnormality. SINUSES: No acute abnormality. SOFT TISSUES AND SKULL: No acute soft tissue abnormality. No skull fracture. IMPRESSION: 1. No acute intracranial abnormality. 2. 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 Electronically signed by: Franky Crease MD 11/22/2024 09:15 PM EST RP Workstation: [id number]     Procedures   Medications Ordered in the ED - No data to display                                  Medical Decision Making  83 yo F with a chief complaints of redness to the white of her left eye.  I think this is a subconjunctival hemorrhage.  No significant drainage I think less likely to be conjunctivitis.  She denies any pain denies change in vision.  She was seen in the MSE process and had a CT of the head.  This is negative for acute intracranial pathology.  She has no other obvious neurologic symptoms or deficits.  Will treat supportively.  PCP follow-up.  11:16 PM:  I have discussed the diagnosis/risks/treatment options with the patient and family.  Evaluation and diagnostic testing in the emergency department does not suggest an emergent condition requiring admission or immediate intervention beyond what has been performed at this time.  They will follow up with PCP. We also discussed returning to the ED immediately if new or worsening sx occur. We discussed the sx which are most concerning (e.g., sudden worsening pain, fever, inability to tolerate by mouth) that necessitate immediate return. Medications administered to the patient during their visit and any new prescriptions provided to the patient are listed below.  Medications given during this  visit Medications - No data to display   The patient appears reasonably screen and/or stabilized for discharge and I doubt any other medical condition or other [facility] requiring further screening, evaluation, or treatment in the ED at this time prior to discharge.       Final diagnoses:  Conjunctival hemorrhage of left eye    ED Discharge Orders     None          Emil Share, DO 11/22/24 2316

## 2024-11-22 NOTE — ED Triage Notes (Signed)
 Left eye conjunctiva redness. Denies injury or any trauma. No changes in vision. Noticed it after getting hair washed today but does not believe any water  or product got in it. Reports headaches off and on for few weeks. No neuro deficits.

## 2024-11-22 NOTE — Discharge Instructions (Signed)
 As we discussed please return for pain in the eye or change in vision.  Please return for sudden worsening headache one-sided numbness or weakness or difficulty speech or swallowing.  Follow-up with your family doctor in the office.

## 2024-12-06 ENCOUNTER — Other Ambulatory Visit: Payer: Self-pay

## 2024-12-15 ENCOUNTER — Ambulatory Visit: Admitting: Diagnostic Neuroimaging

## 2024-12-30 ENCOUNTER — Other Ambulatory Visit: Payer: Self-pay

## 2024-12-30 MED ORDER — NITROGLYCERIN 0.4 MG SL SUBL
0.4000 mg | SUBLINGUAL_TABLET | SUBLINGUAL | 3 refills | Status: AC
  Filled 2024-12-30: qty 25, 9d supply, fill #0
  Filled 2025-01-11: qty 25, 9d supply, fill #1
  Filled 2025-01-23 – 2025-02-02 (×3): qty 25, 9d supply, fill #2

## 2025-01-09 ENCOUNTER — Other Ambulatory Visit: Payer: Self-pay

## 2025-01-09 ENCOUNTER — Other Ambulatory Visit (HOSPITAL_COMMUNITY): Payer: Self-pay

## 2025-01-11 ENCOUNTER — Telehealth: Payer: Self-pay

## 2025-01-11 NOTE — Telephone Encounter (Signed)
 IC,lmvm advised patient will need to come in to complete and sign a request authorization. Once that shara is received, it will go to [facility] medical records to be processed. She can pick up xray here. Ill send a request for xrays once we received auth.

## 2025-01-11 NOTE — Telephone Encounter (Signed)
 Patient called and needs her medical records and copies of her x-rays for a second opinion regarding her knee. [phone]

## 2025-01-17 ENCOUNTER — Other Ambulatory Visit: Payer: Self-pay

## 2025-01-19 ENCOUNTER — Telehealth: Payer: Self-pay | Admitting: Diagnostic Neuroimaging

## 2025-01-19 NOTE — Telephone Encounter (Signed)
 Pt made request to cx

## 2025-01-20 ENCOUNTER — Ambulatory Visit: Admitting: Diagnostic Neuroimaging

## 2025-01-20 ENCOUNTER — Other Ambulatory Visit: Payer: Self-pay

## 2025-02-01 ENCOUNTER — Other Ambulatory Visit: Payer: Self-pay

## 2025-02-02 ENCOUNTER — Other Ambulatory Visit: Payer: Self-pay

## 2025-02-03 ENCOUNTER — Other Ambulatory Visit (HOSPITAL_COMMUNITY): Payer: Self-pay

## 2025-02-03 MED ORDER — ROSUVASTATIN CALCIUM 10 MG PO TABS: 10.0000 mg | ORAL_TABLET | Freq: Every day | ORAL | 3 refills | Status: AC

## 2025-02-07 ENCOUNTER — Ambulatory Visit: Admitting: Neurology

## 2025-02-27 ENCOUNTER — Ambulatory Visit: Admitting: Family Medicine

## 2025-08-01 ENCOUNTER — Encounter
